# Patient Record
Sex: Male | Born: 1940 | Race: White | Hispanic: No | Smoking: Never smoker
Health system: Southern US, Community
[De-identification: ages and names within clinical notes are randomized; demographics above are authoritative.]

## PROBLEM LIST (undated history)

## (undated) ENCOUNTER — Emergency Department (HOSPITAL_COMMUNITY): Admission: EM | Payer: Self-pay | Source: Home / Self Care

## (undated) DIAGNOSIS — N400 Enlarged prostate without lower urinary tract symptoms: Secondary | ICD-10-CM

## (undated) DIAGNOSIS — I10 Essential (primary) hypertension: Secondary | ICD-10-CM

## (undated) DIAGNOSIS — E785 Hyperlipidemia, unspecified: Secondary | ICD-10-CM

## (undated) DIAGNOSIS — N189 Chronic kidney disease, unspecified: Secondary | ICD-10-CM

## (undated) DIAGNOSIS — R569 Unspecified convulsions: Secondary | ICD-10-CM

## (undated) DIAGNOSIS — N2889 Other specified disorders of kidney and ureter: Secondary | ICD-10-CM

## (undated) DIAGNOSIS — C801 Malignant (primary) neoplasm, unspecified: Secondary | ICD-10-CM

## (undated) HISTORY — PX: TONSILLECTOMY: SUR1361

## (undated) HISTORY — DX: Chronic kidney disease, unspecified: N18.9

## (undated) HISTORY — DX: Malignant (primary) neoplasm, unspecified: C80.1

---

## 1972-03-30 HISTORY — PX: APPENDECTOMY: SHX54

## 2000-03-30 HISTORY — PX: HERNIA REPAIR: SHX51

## 2001-03-03 ENCOUNTER — Ambulatory Visit (HOSPITAL_COMMUNITY): Admission: RE | Admit: 2001-03-03 | Discharge: 2001-03-03 | Payer: Self-pay | Admitting: Gastroenterology

## 2001-04-01 ENCOUNTER — Encounter: Payer: Self-pay | Admitting: General Surgery

## 2001-04-01 ENCOUNTER — Encounter: Admission: RE | Admit: 2001-04-01 | Discharge: 2001-04-01 | Payer: Self-pay | Admitting: General Surgery

## 2001-04-04 ENCOUNTER — Ambulatory Visit (HOSPITAL_BASED_OUTPATIENT_CLINIC_OR_DEPARTMENT_OTHER): Admission: RE | Admit: 2001-04-04 | Discharge: 2001-04-04 | Payer: Self-pay | Admitting: General Surgery

## 2005-10-19 ENCOUNTER — Encounter: Admission: RE | Admit: 2005-10-19 | Discharge: 2005-10-19 | Payer: Self-pay | Admitting: Internal Medicine

## 2008-04-03 ENCOUNTER — Encounter: Admission: RE | Admit: 2008-04-03 | Discharge: 2008-04-03 | Payer: Self-pay | Admitting: Internal Medicine

## 2010-08-15 NOTE — Op Note (Signed)
[place]. [facility]  Patient:    KATHAN, KIRKER Visit Number: [id number] MRN: [id number]          Service Type: DSU Location: [facility] Attending Physician:  Cherylynn Ridges Dictated by:   Jimmye Norman, M.D. Proc. Date: 04/04/01 Admit Date:  04/04/2001                             Operative Report  PREOPERATIVE DIAGNOSIS:  Left inguinal hernia, might be direct.  POSTOPERATIVE DIAGNOSIS:  Large direct left inguinal hernia.  PROCEDURE:  Left inguinal hernia repair with Marlex mesh.  SURGEON:  Jimmye Norman, M.D.  ANESTHESIA:  General with a laryngeal airway.  ESTIMATED BLOOD LOSS:  Less than 20 cc.  COMPLICATIONS:  None.  CONDITION:  Stable.  INDICATION FOR OPERATION:  The patient is a 70 year old gentleman with an increasingly symptomatic left inguinal hernia, who now comes in for repair.  FINDINGS:  The patient had a large direct hernia with no indirect component.  DESCRIPTION OF PROCEDURE:  The patient was taken to the operating room and placed on the operating table in supine position.  After an adequate general anesthetic was administered with a laryngeal airway, she was prepped and draped in the usual sterile manner, exposing the left inguinal area.  A curvilinear transverse incision was made at the level of the superficial inguinal ring on the left side.  It was taken down into the subcutaneous tissue, where large blood vessels had to be cauterized.  We subsequently dissected down to the fascia of the external oblique muscle, which was then cleared of its areolar attachments.  We went down to the external oblique fascia, which was subsequently split along its fibers through the superficial inguinal ring.  It was at that point that the spermatic cord and the large direct sac were mobilized at the pubic tubercle.  A Penrose drain was placed around the spermatic cord.  We subsequently were able to dissect away the direct sac from the spermatic  cord.  We then placed the inguinal ligament laterally along with the ilioinguinal nerve, then imbricated the direct sac on itself using four to five interrupted simple stitches of 0 Ethibond.  Once this was done, we laid an oval piece of Marlex mesh measuring approximately 3 x 5 cm in size, attaching it to the pubic tubercle and bringing it out laterally along the reflected portion of the inguinal ligament and medial and superior medially to the conjoined tendon.  This was done using a running 0 Prolene suture.  Once this was in place, it was sutured up to the internal ring, taking care not to involve the iliohypogastric or the ilioinguinal nerve.  We irrigated the wound with antibiotic solution.  We closed the external oblique fascia using a running 3-0 Vicryl suture.  Then we closed Scarpas fascia using interrupted 3-0 Vicryl.  Marcaine 0.25% was injected into the site, including at the anterior superior iliac spine along the nerve distribution.  Once this was done, the skin was closed using a running subcuticular stitch of 4-0 Prolene.  A sterile dressing was applied.  All needle counts, sponge counts, and instrument counts were correct at the conclusion of the case. Dictated by:   Jimmye Norman, M.D. Attending Physician:  Cherylynn Ridges DD:  04/04/01 TD:  04/04/01 Job: [id number]

## 2010-08-15 NOTE — Procedures (Signed)
[place]. [facility]  Patient:    Taylor Dillon, Taylor Dillon Visit Number: [id number] MRN: [id number]          Service Type: END Location: ENDO Attending Physician:  Charna Elizabeth Dictated by:   Anselmo Rod, M.D. Proc. Date: 03/03/01 Admit Date:  03/03/2001   CC:         Kern Reap, M.D.   Procedure Report  DATE OF BIRTH:  Aug 22, 1940  REFERRING PHYSICIAN:  Kern Reap, M.D.  PROCEDURE PERFORMED:  Colonoscopy.  ENDOSCOPIST:  Anselmo Rod, M.D.  INSTRUMENT USED:  Olympus video colonoscope.  INDICATIONS FOR PROCEDURE:  The patient is a 70 year old white male with a history of guaiac positive stools, rule out colonic polyps, masses, hemorrhoids etc.  PREPROCEDURE PREPARATION:  Informed consent was procured from the patient. The patient was fasted for eight hours prior to the procedure and prepped with a bottle of magnesium citrate and a gallon of NuLytely the night prior to the procedure.  PREPROCEDURE PHYSICAL:  The patient had stable vital signs.  Neck supple. Chest clear to auscultation.  S1, S2 regular.  Abdomen soft with normal bowel sounds.  DESCRIPTION OF PROCEDURE:  The patient was placed in the left lateral decubitus position and sedated with 90 mg of Demerol and 9 mg of Versed intravenously.  Once the patient was adequately sedated and maintained on low-flow oxygen and continuous cardiac monitoring, the Olympus video colonoscope was advanced from the rectum to the cecum without difficulty.  The entire colonic mucosa appeared healthy and without lesions.  There was some residual stool in the colon.  Multiple washes were done.  No masses, erosions, ulcerations or diverticula were seen.  The patient tolerated the procedure well without complication.  The appendiceal orifice and ileocecal valve were clearly visualized and photographed.  IMPRESSION:  Normal-appearing colon up to the cecum.  RECOMMENDATIONS:  Repeat guaiac testing  will be done on an outpatient basis and further recommendations made as need arises.Dictated by:   Anselmo Rod, M.D. Attending Physician:  Charna Elizabeth DD:  03/03/01 TD:  03/03/01 Job: [id number]

## 2010-08-15 NOTE — H&P (Signed)
. [facility]  Patient:    Taylor Dillon, Taylor Dillon Visit Number: [id number] MRN: [id number]          Service Type: Attending:  Jimmye Norman, M.D. Dictated by:   Jimmye Norman, M.D.   CC:         Kern Reap, M.D.   History and Physical  DATE OF BIRTH:  1940/08/30  IDENTIFICATION AND CHIEF COMPLAINT:  The patient is a 70 year old gentleman with a left inguinal hernia who now comes in for repair.  HISTORY OF PRESENT ILLNESS:  I saw the patient on March 08, 2001, at which time, he was complaining of a hernia that had become increasingly more symptomatic over the two years that he had known that it had been present.  He has pain in the area of the inguinal area to where it it annoying with any activity.  Now, it hurts more when he is in the upright position as supposed to when he swims, at which time, he does not appear to have any problems.  It has not been incarcerated.  He had no nausea or vomiting.  No changes in his bowel habits.  He has no difficulty urinating and has no constipation.  PAST MEDICAL HISTORY: 1. Hypertension. 2. Hypercholesterolemia.  MEDICATIONS:  Zocor 20 mg a day, lisinopril and hydrochlorothiazide combination of 20 to 25 mg once a day.  PAST SURGICAL HISTORY:  He had an appendectomy in 1974 and a tonsillectomy in 1950.  He has a history of alcoholism for which he has been free of any alcohol for at least the past seven years.  REVIEW OF SYSTEMS:  No constipation and no diarrhea.  No difficulty urinating.  ALLERGIES:  No known drug allergies.  PHYSICAL EXAMINATION:  GENERAL:  He is well-nourished, a thin gentleman in no acute distress.  HEENT:  He is normocephalic and atraumatic.  Has no scleral icterus.  He has no cervical adenopathy.  LUNGS:  Clear.  CARDIAC:  Exam is regular rhythm and rate with no murmurs.  No lifts or heaves.  No gallops.  ABDOMEN:  Soft and nontender.  He has a large left inguinal hernia  which appears to be direct type.  No palpable hernia on the right side.  RECTAL:  Exam was deferred.  LABORATORY STUDIES:  Will all be done preoperatively.  IMPRESSION:  Left inguinal hernia, possibly or likely direct.  PLAN:  Perform a mesh repair of the hernia as an outpatient.  It will be an open repair as supposed to a laparoscopic repair. Dictated by:   Jimmye Norman, M.D. Attending:  Jimmye Norman, M.D. DD:  04/04/01 TD:  04/04/01 Job: [id number]

## 2010-08-27 ENCOUNTER — Ambulatory Visit: Payer: Self-pay | Admitting: Cardiology

## 2011-05-25 DIAGNOSIS — Z1211 Encounter for screening for malignant neoplasm of colon: Secondary | ICD-10-CM | POA: Diagnosis not present

## 2011-05-25 DIAGNOSIS — D126 Benign neoplasm of colon, unspecified: Secondary | ICD-10-CM | POA: Diagnosis not present

## 2011-09-11 DIAGNOSIS — E782 Mixed hyperlipidemia: Secondary | ICD-10-CM | POA: Diagnosis not present

## 2011-09-11 DIAGNOSIS — E559 Vitamin D deficiency, unspecified: Secondary | ICD-10-CM | POA: Diagnosis not present

## 2011-09-11 DIAGNOSIS — I10 Essential (primary) hypertension: Secondary | ICD-10-CM | POA: Diagnosis not present

## 2011-09-11 DIAGNOSIS — Z125 Encounter for screening for malignant neoplasm of prostate: Secondary | ICD-10-CM | POA: Diagnosis not present

## 2011-09-11 DIAGNOSIS — Z0000 Encounter for general adult medical examination without abnormal findings: Secondary | ICD-10-CM | POA: Diagnosis not present

## 2011-09-14 DIAGNOSIS — E782 Mixed hyperlipidemia: Secondary | ICD-10-CM | POA: Diagnosis not present

## 2011-09-14 DIAGNOSIS — Z0000 Encounter for general adult medical examination without abnormal findings: Secondary | ICD-10-CM | POA: Diagnosis not present

## 2011-09-14 DIAGNOSIS — I10 Essential (primary) hypertension: Secondary | ICD-10-CM | POA: Diagnosis not present

## 2011-12-21 DIAGNOSIS — Z23 Encounter for immunization: Secondary | ICD-10-CM | POA: Diagnosis not present

## 2012-02-03 DIAGNOSIS — H2510 Age-related nuclear cataract, unspecified eye: Secondary | ICD-10-CM | POA: Diagnosis not present

## 2012-02-09 DIAGNOSIS — H269 Unspecified cataract: Secondary | ICD-10-CM | POA: Diagnosis not present

## 2012-02-09 DIAGNOSIS — H2510 Age-related nuclear cataract, unspecified eye: Secondary | ICD-10-CM | POA: Diagnosis not present

## 2012-02-17 DIAGNOSIS — H2510 Age-related nuclear cataract, unspecified eye: Secondary | ICD-10-CM | POA: Diagnosis not present

## 2012-02-17 DIAGNOSIS — H269 Unspecified cataract: Secondary | ICD-10-CM | POA: Diagnosis not present

## 2012-03-11 DIAGNOSIS — Z0000 Encounter for general adult medical examination without abnormal findings: Secondary | ICD-10-CM | POA: Diagnosis not present

## 2012-03-11 DIAGNOSIS — N400 Enlarged prostate without lower urinary tract symptoms: Secondary | ICD-10-CM | POA: Diagnosis not present

## 2012-03-11 DIAGNOSIS — I10 Essential (primary) hypertension: Secondary | ICD-10-CM | POA: Diagnosis not present

## 2012-03-11 DIAGNOSIS — Z79899 Other long term (current) drug therapy: Secondary | ICD-10-CM | POA: Diagnosis not present

## 2012-03-11 DIAGNOSIS — E785 Hyperlipidemia, unspecified: Secondary | ICD-10-CM | POA: Diagnosis not present

## 2012-03-11 DIAGNOSIS — Z125 Encounter for screening for malignant neoplasm of prostate: Secondary | ICD-10-CM | POA: Diagnosis not present

## 2012-03-11 DIAGNOSIS — E782 Mixed hyperlipidemia: Secondary | ICD-10-CM | POA: Diagnosis not present

## 2012-03-11 DIAGNOSIS — E7800 Pure hypercholesterolemia, unspecified: Secondary | ICD-10-CM | POA: Diagnosis not present

## 2012-03-30 HISTORY — PX: OTHER SURGICAL HISTORY: SHX169

## 2012-04-25 DIAGNOSIS — J069 Acute upper respiratory infection, unspecified: Secondary | ICD-10-CM | POA: Diagnosis not present

## 2012-09-02 DIAGNOSIS — Z23 Encounter for immunization: Secondary | ICD-10-CM | POA: Diagnosis not present

## 2012-09-02 DIAGNOSIS — Z125 Encounter for screening for malignant neoplasm of prostate: Secondary | ICD-10-CM | POA: Diagnosis not present

## 2012-09-02 DIAGNOSIS — I10 Essential (primary) hypertension: Secondary | ICD-10-CM | POA: Diagnosis not present

## 2012-09-02 DIAGNOSIS — Z0000 Encounter for general adult medical examination without abnormal findings: Secondary | ICD-10-CM | POA: Diagnosis not present

## 2012-12-21 DIAGNOSIS — Z961 Presence of intraocular lens: Secondary | ICD-10-CM | POA: Diagnosis not present

## 2012-12-21 DIAGNOSIS — H538 Other visual disturbances: Secondary | ICD-10-CM | POA: Diagnosis not present

## 2012-12-21 DIAGNOSIS — H26499 Other secondary cataract, unspecified eye: Secondary | ICD-10-CM | POA: Diagnosis not present

## 2012-12-28 DIAGNOSIS — H2510 Age-related nuclear cataract, unspecified eye: Secondary | ICD-10-CM | POA: Diagnosis not present

## 2013-01-02 DIAGNOSIS — Z23 Encounter for immunization: Secondary | ICD-10-CM | POA: Diagnosis not present

## 2013-02-20 DIAGNOSIS — H903 Sensorineural hearing loss, bilateral: Secondary | ICD-10-CM | POA: Diagnosis not present

## 2013-05-22 DIAGNOSIS — D235 Other benign neoplasm of skin of trunk: Secondary | ICD-10-CM | POA: Diagnosis not present

## 2013-05-22 DIAGNOSIS — L821 Other seborrheic keratosis: Secondary | ICD-10-CM | POA: Diagnosis not present

## 2013-05-22 DIAGNOSIS — B351 Tinea unguium: Secondary | ICD-10-CM | POA: Diagnosis not present

## 2013-09-05 DIAGNOSIS — R7309 Other abnormal glucose: Secondary | ICD-10-CM | POA: Diagnosis not present

## 2013-09-05 DIAGNOSIS — Z125 Encounter for screening for malignant neoplasm of prostate: Secondary | ICD-10-CM | POA: Diagnosis not present

## 2013-09-05 DIAGNOSIS — Z0000 Encounter for general adult medical examination without abnormal findings: Secondary | ICD-10-CM | POA: Diagnosis not present

## 2013-09-05 DIAGNOSIS — Z23 Encounter for immunization: Secondary | ICD-10-CM | POA: Diagnosis not present

## 2013-09-05 DIAGNOSIS — I10 Essential (primary) hypertension: Secondary | ICD-10-CM | POA: Diagnosis not present

## 2013-09-05 DIAGNOSIS — R198 Other specified symptoms and signs involving the digestive system and abdomen: Secondary | ICD-10-CM | POA: Diagnosis not present

## 2013-10-04 DIAGNOSIS — Z961 Presence of intraocular lens: Secondary | ICD-10-CM | POA: Diagnosis not present

## 2013-10-04 DIAGNOSIS — H43819 Vitreous degeneration, unspecified eye: Secondary | ICD-10-CM | POA: Diagnosis not present

## 2013-12-01 DIAGNOSIS — Z23 Encounter for immunization: Secondary | ICD-10-CM | POA: Diagnosis not present

## 2014-03-30 DIAGNOSIS — N2889 Other specified disorders of kidney and ureter: Secondary | ICD-10-CM

## 2014-03-30 HISTORY — DX: Other specified disorders of kidney and ureter: N28.89

## 2014-04-23 ENCOUNTER — Other Ambulatory Visit: Payer: Self-pay | Admitting: Family Medicine

## 2014-04-23 DIAGNOSIS — R1900 Intra-abdominal and pelvic swelling, mass and lump, unspecified site: Secondary | ICD-10-CM

## 2014-04-26 ENCOUNTER — Ambulatory Visit
Admission: RE | Admit: 2014-04-26 | Discharge: 2014-04-26 | Disposition: A | Discharge status: Home / Self Care | Payer: Medicare Other | Source: Ambulatory Visit | Attending: Family Medicine | Admitting: Family Medicine

## 2014-04-26 DIAGNOSIS — K8020 Calculus of gallbladder without cholecystitis without obstruction: Secondary | ICD-10-CM | POA: Diagnosis not present

## 2014-04-26 DIAGNOSIS — K4090 Unilateral inguinal hernia, without obstruction or gangrene, not specified as recurrent: Secondary | ICD-10-CM | POA: Diagnosis not present

## 2014-04-26 DIAGNOSIS — R1900 Intra-abdominal and pelvic swelling, mass and lump, unspecified site: Secondary | ICD-10-CM

## 2014-04-26 DIAGNOSIS — N2889 Other specified disorders of kidney and ureter: Secondary | ICD-10-CM | POA: Diagnosis not present

## 2014-04-26 MED ORDER — IOHEXOL 300 MG/ML  SOLN
100.0000 mL | Freq: Once | INTRAMUSCULAR | Status: AC | PRN
Start: 2014-04-26 — End: 2014-04-26
  Administered 2014-04-26: 100 mL via INTRAVENOUS

## 2014-05-08 DIAGNOSIS — Z1211 Encounter for screening for malignant neoplasm of colon: Secondary | ICD-10-CM | POA: Diagnosis not present

## 2014-05-08 DIAGNOSIS — R634 Abnormal weight loss: Secondary | ICD-10-CM | POA: Diagnosis not present

## 2014-05-08 DIAGNOSIS — K4090 Unilateral inguinal hernia, without obstruction or gangrene, not specified as recurrent: Secondary | ICD-10-CM | POA: Diagnosis not present

## 2014-05-08 DIAGNOSIS — K8080 Other cholelithiasis without obstruction: Secondary | ICD-10-CM | POA: Diagnosis not present

## 2014-05-08 DIAGNOSIS — Z86010 Personal history of colonic polyps: Secondary | ICD-10-CM | POA: Diagnosis not present

## 2014-05-09 DIAGNOSIS — K4090 Unilateral inguinal hernia, without obstruction or gangrene, not specified as recurrent: Secondary | ICD-10-CM | POA: Diagnosis not present

## 2014-05-09 DIAGNOSIS — C649 Malignant neoplasm of unspecified kidney, except renal pelvis: Secondary | ICD-10-CM | POA: Diagnosis not present

## 2014-05-10 DIAGNOSIS — C642 Malignant neoplasm of left kidney, except renal pelvis: Secondary | ICD-10-CM | POA: Diagnosis not present

## 2014-05-10 DIAGNOSIS — J9811 Atelectasis: Secondary | ICD-10-CM | POA: Diagnosis not present

## 2014-05-22 DIAGNOSIS — K4090 Unilateral inguinal hernia, without obstruction or gangrene, not specified as recurrent: Secondary | ICD-10-CM | POA: Diagnosis not present

## 2014-05-24 DIAGNOSIS — K4090 Unilateral inguinal hernia, without obstruction or gangrene, not specified as recurrent: Secondary | ICD-10-CM | POA: Diagnosis not present

## 2014-05-24 DIAGNOSIS — C649 Malignant neoplasm of unspecified kidney, except renal pelvis: Secondary | ICD-10-CM | POA: Diagnosis not present

## 2014-05-25 ENCOUNTER — Other Ambulatory Visit: Payer: Self-pay | Admitting: Urology

## 2014-05-29 ENCOUNTER — Other Ambulatory Visit: Payer: Self-pay | Admitting: Urology

## 2014-06-05 ENCOUNTER — Other Ambulatory Visit (INDEPENDENT_AMBULATORY_CARE_PROVIDER_SITE_OTHER): Payer: Self-pay | Admitting: Surgery

## 2014-06-05 NOTE — Progress Notes (Signed)
Please put orders in Epic surgery 06-15-14 pre op 06-07-14 Thanks

## 2014-06-06 NOTE — Progress Notes (Signed)
Ct abd/pelvis 1/16 epic, cmp 05/25/14 on chart

## 2014-06-06 NOTE — Patient Instructions (Addendum)
Taylor Dillon  06/07/2014   Your procedure is scheduled on: 06/15/14   Report to [facility] Main  Entrance and follow signs to               [place] at 5:15 AM.   Call this number if you have problems the morning of surgery [phone]   Remember:  Do not eat food or drink liquids :After Midnight.     Take these medicines the morning of surgery with A SIP OF WATER: none              FOLLOW BOWEL PREP INSTRUCTIONS                               You may not have any metal on your body including hair pins and              piercings  Do not wear jewelry, make-up, lotions, powders or perfumes.             Do not wear nail polish.  Do not shave  48 hours prior to surgery.              Men may shave face and neck.   Do not bring valuables to the hospital. [place].  Contacts, dentures or bridgework may not be worn into surgery.  Leave suitcase in the car. After surgery it may be brought to your room.     Patients discharged the day of surgery will not be allowed to drive home.  Name and phone number of your driver:  Special Instructions: N/A              Please read over the following fact sheets you were given: _____________________________________________________________________                                                     [place]  Before surgery, you can play an important role.  Because skin is not sterile, your skin needs to be as free of germs as possible.  You can reduce the number of germs on your skin by washing with CHG (chlorahexidine gluconate) soap before surgery.  CHG is an antiseptic cleaner which kills germs and bonds with the skin to continue killing germs even after washing. Please DO NOT use if you have an allergy to CHG or antibacterial soaps.  If your skin becomes reddened/irritated stop using the CHG and inform your nurse when you arrive  at Short Stay. Do not shave (including legs and underarms) for at least 48 hours prior to the first CHG shower.  You may shave your face. Please follow these instructions carefully:   1.  Shower with CHG Soap the night before surgery and the  morning of Surgery.   2.  If you choose to wash your hair, wash your hair first as usual with your  normal  Shampoo.   3.  After you shampoo, rinse your hair and body thoroughly to remove the  shampoo.  4.  Use CHG as you would any other liquid soap.  You can apply chg directly  to the skin and wash . Gently wash with scrungie or clean wascloth    5.  Apply the CHG Soap to your body ONLY FROM THE NECK DOWN.   Do not use on open                           Wound or open sores. Avoid contact with eyes, ears mouth and genitals (private parts).                        Genitals (private parts) with your normal soap.              6.  Wash thoroughly, paying special attention to the area where your surgery  will be performed.   7.  Thoroughly rinse your body with warm water from the neck down.   8.  DO NOT shower/wash with your normal soap after using and rinsing off  the CHG Soap .                9.  Pat yourself dry with a clean towel.             10.  Wear clean pajamas.             11.  Place clean sheets on your bed the night of your first shower and do not  sleep with pets.  Day of Surgery : Do not apply any lotions/deodorants the morning of surgery.  Please wear clean clothes to the hospital/surgery center.  FAILURE TO FOLLOW THESE INSTRUCTIONS MAY RESULT IN THE CANCELLATION OF YOUR SURGERY    PATIENT SIGNATURE_________________________________  ______________________________________________________________________

## 2014-06-07 ENCOUNTER — Encounter (HOSPITAL_COMMUNITY)
Admission: RE | Admit: 2014-06-07 | Discharge: 2014-06-07 | Disposition: A | Discharge status: Home / Self Care | Payer: Medicare Other | Source: Ambulatory Visit | Attending: Urology | Admitting: Urology

## 2014-06-07 ENCOUNTER — Encounter (HOSPITAL_COMMUNITY): Payer: Self-pay

## 2014-06-07 DIAGNOSIS — K4090 Unilateral inguinal hernia, without obstruction or gangrene, not specified as recurrent: Secondary | ICD-10-CM | POA: Insufficient documentation

## 2014-06-07 DIAGNOSIS — N289 Disorder of kidney and ureter, unspecified: Secondary | ICD-10-CM | POA: Diagnosis not present

## 2014-06-07 DIAGNOSIS — Z01810 Encounter for preprocedural cardiovascular examination: Secondary | ICD-10-CM | POA: Insufficient documentation

## 2014-06-07 DIAGNOSIS — Z01812 Encounter for preprocedural laboratory examination: Secondary | ICD-10-CM | POA: Insufficient documentation

## 2014-06-07 HISTORY — DX: Hyperlipidemia, unspecified: E78.5

## 2014-06-07 HISTORY — DX: Unspecified convulsions: R56.9

## 2014-06-07 HISTORY — DX: Benign prostatic hyperplasia without lower urinary tract symptoms: N40.0

## 2014-06-07 HISTORY — DX: Other specified disorders of kidney and ureter: N28.89

## 2014-06-07 HISTORY — DX: Essential (primary) hypertension: I10

## 2014-06-07 LAB — CBC
HEMATOCRIT: 45.2 % (ref 39.0–52.0)
HEMOGLOBIN: 15 g/dL (ref 13.0–17.0)
MCH: 30.6 pg (ref 26.0–34.0)
MCHC: 33.2 g/dL (ref 30.0–36.0)
MCV: 92.2 fL (ref 78.0–100.0)
Platelets: 237 10*3/uL (ref 150–400)
RBC: 4.9 MIL/uL (ref 4.22–5.81)
RDW: 13.5 % (ref 11.5–15.5)
WBC: 6 10*3/uL (ref 4.0–10.5)

## 2014-06-14 NOTE — H&P (Signed)
Elsie Lincoln 05/22/2014 2:16 PM Location: [facility] Patient #: [id number] DOB: [birth year] Single / Language: Cleophus Molt / Race: White Male  History of Present Illness (Brynli Ollis A. Ninfa Linden MD; 05/22/2014 2:59 PM) Patient words: RIH.  The patient is a 74 year old male who presents with an inguinal hernia. This gentleman is referred by Dr. Diona Fanti for evaluation of a right inguinal hernia. During a workup of his hernia, he had undergone a CAT scan showing a large left renal mass. He is going to be scheduled soon for a laparoscopic left nephrectomy. The patient first noticed his hernia in the right inguinal area last year. He has had some mild discomfort recently from the hernia that prompted a CAT scan. He is currently now asymptomatic from the right inguinal hernia. He has had a left inguinal hernia performed by one of the [facility] physicians in the past. The plan would be to have the hernia fixed at the same time as his laparoscopic nephrectomy   Other Problems Mammie Lorenzo, LPN; 9/73/5329 9:24 PM) Alcohol Abuse Enlarged Prostate Hepatitis High blood pressure Hypercholesterolemia Inguinal Hernia Seizure Disorder  Past Surgical History Mammie Lorenzo, LPN; 2/68/3419 6:22 PM) Appendectomy Cataract Surgery Bilateral. Open Inguinal Hernia Surgery Left. Tonsillectomy  Diagnostic Studies History Mammie Lorenzo, LPN; 2/97/9892 1:19 PM) Colonoscopy 1-5 years ago  Allergies Mammie Lorenzo, LPN; 07/15/4079 4:48 PM) No Known Drug Allergies02/23/2016  Medication History Mammie Lorenzo, LPN; 1/85/6314 9:70 PM) Lisinopril-Hydrochlorothiazide (10-12.5MG  Tablet, Oral) Active. Simvastatin (20MG  Tablet, Oral) Active. Norvasc (5MG  Tablet, Oral) Active.  Social History Mammie Lorenzo, LPN; 2/63/7858 8:50 PM) Alcohol use Remotely quit alcohol use. Caffeine use Coffee, Tea. No drug use Tobacco use Never smoker.  Family History Mammie Lorenzo, LPN; 2/77/4128  7:86 PM) Alcohol Abuse Father, Sister. Diabetes Mellitus Brother. Respiratory Condition Mother, Sister.  Vitals Claiborne Billings Dockery LPN; 7/67/2094 7:09 PM) 05/22/2014 2:17 PM Weight: 148.13 lb Height: 70in Body Surface Area: 1.82 m Body Mass Index: 21.25 kg/m Temp.: 97.67F  Pulse: 81 (Regular)  Resp.: 16 (Unlabored)  BP: 136/74 (Sitting, Left Arm, Standard)    Physical Exam (Orell Hurtado A. Ninfa Linden MD; 05/22/2014 3:00 PM) General Mental Status-Alert. General Appearance-Consistent with stated age. Hydration-Well hydrated. Voice-Normal.  Head and Neck Head-normocephalic, atraumatic with no lesions or palpable masses. Trachea-midline.  Eye Eyeball - Bilateral-Extraocular movements intact. Sclera/Conjunctiva - Bilateral-No scleral icterus.  Chest and Lung Exam Chest and lung exam reveals -quiet, even and easy respiratory effort with no use of accessory muscles and on auscultation, normal breath sounds, no adventitious sounds and normal vocal resonance. Inspection Chest Wall - Normal. Back - normal.  Cardiovascular Cardiovascular examination reveals -normal heart sounds, regular rate and rhythm with no murmurs and normal pedal pulses bilaterally.  Abdomen Inspection Skin - Scar - no surgical scars. Hernias - Inguinal hernia - Right - Reducible. Palpation/Percussion Palpation and Percussion of the abdomen reveal - Soft, Non Tender, No Rebound tenderness, No Rigidity (guarding) and No hepatosplenomegaly. Auscultation Auscultation of the abdomen reveals - Bowel sounds normal.  Neurologic Neurologic evaluation reveals -alert and oriented x 3 with no impairment of recent or remote memory. Mental Status-Normal.  Musculoskeletal Normal Exam - Left-Upper Extremity Strength Normal and Lower Extremity Strength Normal. Normal Exam - Right-Upper Extremity Strength Normal, Lower Extremity Weakness.    Assessment & Plan (Ashur Glatfelter A. Ninfa Linden MD;  05/22/2014 3:01 PM) RIGHT INGUINAL HERNIA (550.90  K40.90) Impression: I discussed the diagnosis with him in detail. I would recommend an open repair with mesh at the time of his laparoscopic left  nephrectomy. I discussed this with him in detail. I discussed the risk of surgery which includes but is not limited to bleeding, infection, chronic pain, nerve entrapment, recurrence, etc. He has had surgery before left sides was well aware of these risks. We will coordinate this at the discretion of the urologist in their schedule. If I cannot perform the surgery that we may have our LDOW surgeon perform it only if possible as his nephrectomy will be at [facility].     Signed by Harl Bowie, MD (05/22/2014 3:01 PM)

## 2014-06-15 ENCOUNTER — Encounter (HOSPITAL_COMMUNITY): Payer: Self-pay | Admitting: *Deleted

## 2014-06-15 ENCOUNTER — Encounter (HOSPITAL_COMMUNITY)
Admission: RE | Disposition: A | Discharge status: Home / Self Care | Payer: Self-pay | Source: Ambulatory Visit | Attending: Urology

## 2014-06-15 ENCOUNTER — Inpatient Hospital Stay (HOSPITAL_COMMUNITY): Payer: Medicare Other | Admitting: Anesthesiology

## 2014-06-15 ENCOUNTER — Inpatient Hospital Stay (HOSPITAL_COMMUNITY)
Admission: RE | Admit: 2014-06-15 | Discharge: 2014-06-20 | DRG: 657 | Disposition: A | Discharge status: Home / Self Care | Payer: Medicare Other | Source: Ambulatory Visit | Attending: Urology | Admitting: Urology

## 2014-06-15 DIAGNOSIS — I10 Essential (primary) hypertension: Secondary | ICD-10-CM | POA: Diagnosis not present

## 2014-06-15 DIAGNOSIS — N400 Enlarged prostate without lower urinary tract symptoms: Secondary | ICD-10-CM | POA: Diagnosis present

## 2014-06-15 DIAGNOSIS — F1010 Alcohol abuse, uncomplicated: Secondary | ICD-10-CM | POA: Diagnosis present

## 2014-06-15 DIAGNOSIS — E780 Pure hypercholesterolemia: Secondary | ICD-10-CM | POA: Diagnosis present

## 2014-06-15 DIAGNOSIS — Z79899 Other long term (current) drug therapy: Secondary | ICD-10-CM | POA: Diagnosis not present

## 2014-06-15 DIAGNOSIS — E785 Hyperlipidemia, unspecified: Secondary | ICD-10-CM | POA: Diagnosis present

## 2014-06-15 DIAGNOSIS — C642 Malignant neoplasm of left kidney, except renal pelvis: Secondary | ICD-10-CM | POA: Diagnosis present

## 2014-06-15 DIAGNOSIS — G40909 Epilepsy, unspecified, not intractable, without status epilepticus: Secondary | ICD-10-CM | POA: Diagnosis present

## 2014-06-15 DIAGNOSIS — E871 Hypo-osmolality and hyponatremia: Secondary | ICD-10-CM | POA: Diagnosis not present

## 2014-06-15 DIAGNOSIS — R110 Nausea: Secondary | ICD-10-CM | POA: Diagnosis not present

## 2014-06-15 DIAGNOSIS — N2889 Other specified disorders of kidney and ureter: Secondary | ICD-10-CM | POA: Diagnosis not present

## 2014-06-15 DIAGNOSIS — K4090 Unilateral inguinal hernia, without obstruction or gangrene, not specified as recurrent: Secondary | ICD-10-CM | POA: Diagnosis present

## 2014-06-15 DIAGNOSIS — K759 Inflammatory liver disease, unspecified: Secondary | ICD-10-CM | POA: Diagnosis not present

## 2014-06-15 DIAGNOSIS — R339 Retention of urine, unspecified: Secondary | ICD-10-CM | POA: Diagnosis not present

## 2014-06-15 HISTORY — PX: INGUINAL HERNIA REPAIR: SHX194

## 2014-06-15 HISTORY — PX: INSERTION OF MESH: SHX5868

## 2014-06-15 HISTORY — PX: ROBOT ASSISTED LAPAROSCOPIC NEPHRECTOMY: SHX5140

## 2014-06-15 LAB — HEMOGLOBIN AND HEMATOCRIT, BLOOD
HCT: 41.3 % (ref 39.0–52.0)
HEMOGLOBIN: 13.8 g/dL (ref 13.0–17.0)

## 2014-06-15 LAB — TYPE AND SCREEN
ABO/RH(D): O POS
Antibody Screen: NEGATIVE

## 2014-06-15 LAB — ABO/RH: ABO/RH(D): O POS

## 2014-06-15 SURGERY — ROBOTIC ASSISTED LAPAROSCOPIC NEPHRECTOMY
Anesthesia: General | Laterality: Right

## 2014-06-15 MED ORDER — EPHEDRINE SULFATE 50 MG/ML IJ SOLN
INTRAMUSCULAR | Status: AC
  Filled 2014-06-15: qty 1

## 2014-06-15 MED ORDER — CISATRACURIUM BESYLATE 20 MG/10ML IV SOLN
INTRAVENOUS | Status: AC
  Filled 2014-06-15: qty 10

## 2014-06-15 MED ORDER — LIDOCAINE HCL (CARDIAC) 20 MG/ML IV SOLN
INTRAVENOUS | Status: AC
  Filled 2014-06-15: qty 5

## 2014-06-15 MED ORDER — MIDAZOLAM HCL 2 MG/2ML IJ SOLN
INTRAMUSCULAR | Status: AC
  Filled 2014-06-15: qty 2

## 2014-06-15 MED ORDER — CEFAZOLIN SODIUM-DEXTROSE 2-3 GM-% IV SOLR
INTRAVENOUS | Status: AC
  Filled 2014-06-15: qty 50

## 2014-06-15 MED ORDER — SODIUM CHLORIDE 0.9 % IJ SOLN
INTRAMUSCULAR | Status: AC
  Filled 2014-06-15: qty 10

## 2014-06-15 MED ORDER — ONDANSETRON HCL 4 MG/2ML IJ SOLN
INTRAMUSCULAR | Status: DC | PRN
  Administered 2014-06-15: 4 mg via INTRAVENOUS

## 2014-06-15 MED ORDER — HYDROMORPHONE HCL 2 MG/ML IJ SOLN
INTRAMUSCULAR | Status: AC
  Filled 2014-06-15: qty 1

## 2014-06-15 MED ORDER — HYDROCHLOROTHIAZIDE 12.5 MG PO CAPS
12.5000 mg | ORAL_CAPSULE | Freq: Every day | ORAL | Status: DC
  Administered 2014-06-15 – 2014-06-17 (×3): 12.5 mg via ORAL
  Filled 2014-06-15 (×4): qty 1

## 2014-06-15 MED ORDER — MIDAZOLAM HCL 5 MG/5ML IJ SOLN
INTRAMUSCULAR | Status: DC | PRN
  Administered 2014-06-15 (×2): 1 mg via INTRAVENOUS

## 2014-06-15 MED ORDER — PHENYLEPHRINE 40 MCG/ML (10ML) SYRINGE FOR IV PUSH (FOR BLOOD PRESSURE SUPPORT)
PREFILLED_SYRINGE | INTRAVENOUS | Status: AC
Start: 2014-06-15 — End: 2014-06-15
  Filled 2014-06-15: qty 10

## 2014-06-15 MED ORDER — LACTATED RINGERS IV SOLN
INTRAVENOUS | Status: DC | PRN
  Administered 2014-06-15 (×2): via INTRAVENOUS

## 2014-06-15 MED ORDER — HYDROMORPHONE HCL 1 MG/ML IJ SOLN
INTRAMUSCULAR | Status: DC | PRN
  Administered 2014-06-15: .4 mg via INTRAVENOUS
  Administered 2014-06-15: .2 mg via INTRAVENOUS
  Administered 2014-06-15: .4 mg via INTRAVENOUS
  Administered 2014-06-15: .2 mg via INTRAVENOUS
  Administered 2014-06-15: .4 mg via INTRAVENOUS

## 2014-06-15 MED ORDER — SODIUM CHLORIDE 0.9 % IJ SOLN
INTRAMUSCULAR | Status: AC
  Filled 2014-06-15: qty 20

## 2014-06-15 MED ORDER — ACETAMINOPHEN 500 MG PO TABS
1000.0000 mg | ORAL_TABLET | Freq: Four times a day (QID) | ORAL | Status: AC
  Administered 2014-06-15 – 2014-06-16 (×4): 1000 mg via ORAL
  Filled 2014-06-15 (×6): qty 2

## 2014-06-15 MED ORDER — CISATRACURIUM BESYLATE (PF) 10 MG/5ML IV SOLN
INTRAVENOUS | Status: DC | PRN
  Administered 2014-06-15: 6 mg via INTRAVENOUS
  Administered 2014-06-15: 4 mg via INTRAVENOUS
  Administered 2014-06-15: 12 mg via INTRAVENOUS

## 2014-06-15 MED ORDER — PROPOFOL 10 MG/ML IV BOLUS
INTRAVENOUS | Status: DC | PRN
  Administered 2014-06-15: 120 mg via INTRAVENOUS

## 2014-06-15 MED ORDER — FENTANYL CITRATE 0.05 MG/ML IJ SOLN
25.0000 ug | INTRAMUSCULAR | Status: DC | PRN
  Administered 2014-06-15 (×2): 25 ug via INTRAVENOUS
  Administered 2014-06-15: 50 ug via INTRAVENOUS

## 2014-06-15 MED ORDER — BUPIVACAINE LIPOSOME 1.3 % IJ SUSP
INTRAMUSCULAR | Status: DC | PRN
  Administered 2014-06-15: 20 mL

## 2014-06-15 MED ORDER — FENTANYL CITRATE 0.05 MG/ML IJ SOLN
INTRAMUSCULAR | Status: AC
  Filled 2014-06-15: qty 2

## 2014-06-15 MED ORDER — LISINOPRIL 20 MG PO TABS
20.0000 mg | ORAL_TABLET | Freq: Every day | ORAL | Status: DC
  Administered 2014-06-15 – 2014-06-18 (×4): 20 mg via ORAL
  Filled 2014-06-15 (×5): qty 1

## 2014-06-15 MED ORDER — DEXAMETHASONE SODIUM PHOSPHATE 10 MG/ML IJ SOLN
INTRAMUSCULAR | Status: DC | PRN
  Administered 2014-06-15: 10 mg via INTRAVENOUS

## 2014-06-15 MED ORDER — BUPIVACAINE HCL (PF) 0.25 % IJ SOLN
INTRAMUSCULAR | Status: DC | PRN
  Administered 2014-06-15: 20 mL

## 2014-06-15 MED ORDER — BUPIVACAINE LIPOSOME 1.3 % IJ SUSP
20.0000 mL | Freq: Once | INTRAMUSCULAR | Status: DC
  Filled 2014-06-15: qty 20

## 2014-06-15 MED ORDER — ATROPINE SULFATE 0.4 MG/ML IJ SOLN
INTRAMUSCULAR | Status: DC | PRN
  Administered 2014-06-15: 0.4 mg via INTRAVENOUS

## 2014-06-15 MED ORDER — LISINOPRIL-HYDROCHLOROTHIAZIDE 20-12.5 MG PO TABS: 1.0000 | ORAL_TABLET | Freq: Every morning | ORAL | Status: DC

## 2014-06-15 MED ORDER — SUCCINYLCHOLINE CHLORIDE 20 MG/ML IJ SOLN
INTRAMUSCULAR | Status: DC | PRN
  Administered 2014-06-15: 80 mg via INTRAVENOUS

## 2014-06-15 MED ORDER — ONDANSETRON HCL 4 MG/2ML IJ SOLN
INTRAMUSCULAR | Status: AC
  Filled 2014-06-15: qty 2

## 2014-06-15 MED ORDER — OXYCODONE HCL 5 MG PO TABS
5.0000 mg | ORAL_TABLET | ORAL | Status: DC | PRN
  Administered 2014-06-15 – 2014-06-17 (×8): 5 mg via ORAL
  Filled 2014-06-15 (×8): qty 1

## 2014-06-15 MED ORDER — LACTATED RINGERS IV SOLN: INTRAVENOUS | Status: DC

## 2014-06-15 MED ORDER — DEXAMETHASONE SODIUM PHOSPHATE 10 MG/ML IJ SOLN
INTRAMUSCULAR | Status: AC
  Filled 2014-06-15: qty 1

## 2014-06-15 MED ORDER — HYDROMORPHONE HCL 1 MG/ML IJ SOLN
0.5000 mg | INTRAMUSCULAR | Status: DC | PRN
  Administered 2014-06-15 (×2): 1 mg via INTRAVENOUS
  Filled 2014-06-15 (×2): qty 1

## 2014-06-15 MED ORDER — SUFENTANIL CITRATE 50 MCG/ML IV SOLN
INTRAVENOUS | Status: AC
Start: 2014-06-15 — End: 2014-06-15
  Filled 2014-06-15: qty 1

## 2014-06-15 MED ORDER — HYDROMORPHONE HCL 1 MG/ML IJ SOLN
INTRAMUSCULAR | Status: AC
  Filled 2014-06-15: qty 1

## 2014-06-15 MED ORDER — DEXTROSE-NACL 5-0.45 % IV SOLN
INTRAVENOUS | Status: DC
  Administered 2014-06-15 – 2014-06-16 (×4): via INTRAVENOUS

## 2014-06-15 MED ORDER — MEPERIDINE HCL 50 MG/ML IJ SOLN
6.2500 mg | INTRAMUSCULAR | Status: DC | PRN
  Administered 2014-06-15: 12.5 mg via INTRAVENOUS

## 2014-06-15 MED ORDER — LIDOCAINE HCL (CARDIAC) 20 MG/ML IV SOLN
INTRAVENOUS | Status: DC | PRN
  Administered 2014-06-15: 100 mg via INTRAVENOUS

## 2014-06-15 MED ORDER — ATROPINE SULFATE 0.4 MG/ML IJ SOLN
INTRAMUSCULAR | Status: AC
  Filled 2014-06-15: qty 1

## 2014-06-15 MED ORDER — ACETAMINOPHEN 10 MG/ML IV SOLN
1000.0000 mg | Freq: Once | INTRAVENOUS | Status: AC
Start: 2014-06-15 — End: 2014-06-15
  Administered 2014-06-15: 1000 mg via INTRAVENOUS
  Filled 2014-06-15: qty 100

## 2014-06-15 MED ORDER — MEPERIDINE HCL 50 MG/ML IJ SOLN
INTRAMUSCULAR | Status: AC
  Filled 2014-06-15: qty 1

## 2014-06-15 MED ORDER — PROMETHAZINE HCL 25 MG/ML IJ SOLN
INTRAMUSCULAR | Status: AC
  Filled 2014-06-15: qty 1

## 2014-06-15 MED ORDER — SUFENTANIL CITRATE 50 MCG/ML IV SOLN
INTRAVENOUS | Status: DC | PRN
  Administered 2014-06-15 (×5): 10 ug via INTRAVENOUS

## 2014-06-15 MED ORDER — CEFAZOLIN SODIUM-DEXTROSE 2-3 GM-% IV SOLR
2.0000 g | INTRAVENOUS | Status: AC
  Administered 2014-06-15: 2 g via INTRAVENOUS

## 2014-06-15 MED ORDER — SIMVASTATIN 20 MG PO TABS
20.0000 mg | ORAL_TABLET | Freq: Every day | ORAL | Status: DC
  Administered 2014-06-15 – 2014-06-19 (×5): 20 mg via ORAL
  Filled 2014-06-15 (×5): qty 1

## 2014-06-15 MED ORDER — HYDROCODONE-ACETAMINOPHEN 5-325 MG PO TABS: 1.0000 | ORAL_TABLET | Freq: Four times a day (QID) | ORAL | Status: DC | PRN

## 2014-06-15 MED ORDER — PROMETHAZINE HCL 25 MG/ML IJ SOLN
6.2500 mg | INTRAMUSCULAR | Status: DC | PRN
  Administered 2014-06-15: 6.25 mg via INTRAVENOUS

## 2014-06-15 MED ORDER — PROPOFOL 10 MG/ML IV BOLUS
INTRAVENOUS | Status: AC
  Filled 2014-06-15: qty 20

## 2014-06-15 MED ORDER — GLYCOPYRROLATE 0.2 MG/ML IJ SOLN
INTRAMUSCULAR | Status: AC
  Filled 2014-06-15: qty 3

## 2014-06-15 MED ORDER — LACTATED RINGERS IR SOLN
Status: DC | PRN
  Administered 2014-06-15: 1000 mL

## 2014-06-15 MED ORDER — AMLODIPINE BESYLATE 5 MG PO TABS
5.0000 mg | ORAL_TABLET | Freq: Every day | ORAL | Status: DC
  Administered 2014-06-15 – 2014-06-19 (×5): 5 mg via ORAL
  Filled 2014-06-15 (×5): qty 1

## 2014-06-15 MED ORDER — BUPIVACAINE HCL (PF) 0.25 % IJ SOLN
INTRAMUSCULAR | Status: AC
  Filled 2014-06-15: qty 30

## 2014-06-15 MED ORDER — GLYCOPYRROLATE 0.2 MG/ML IJ SOLN
INTRAMUSCULAR | Status: AC
  Filled 2014-06-15: qty 1

## 2014-06-15 MED ORDER — HYDROMORPHONE HCL 1 MG/ML IJ SOLN
0.2500 mg | INTRAMUSCULAR | Status: DC | PRN
  Administered 2014-06-15: 0.5 mg via INTRAVENOUS

## 2014-06-15 MED ORDER — LACTATED RINGERS IV SOLN
INTRAVENOUS | Status: DC | PRN
  Administered 2014-06-15: 08:00:00 via INTRAVENOUS

## 2014-06-15 MED ORDER — GLYCOPYRROLATE 0.2 MG/ML IJ SOLN
INTRAMUSCULAR | Status: DC | PRN
  Administered 2014-06-15: .6 mg via INTRAVENOUS

## 2014-06-15 MED ORDER — NEOSTIGMINE METHYLSULFATE 10 MG/10ML IV SOLN
INTRAVENOUS | Status: DC | PRN
  Administered 2014-06-15: 4 mg via INTRAVENOUS

## 2014-06-15 SURGICAL SUPPLY — 78 items
ADH SKN CLS APL DERMABOND .7 (GAUZE/BANDAGES/DRESSINGS) ×4
BLADE HEX COATED 2.75 (ELECTRODE) ×3 IMPLANT
BLADE SURG 15 STRL LF DISP TIS (BLADE) ×2 IMPLANT
BLADE SURG 15 STRL SS (BLADE) ×3
CHLORAPREP W/TINT 26ML (MISCELLANEOUS) ×6 IMPLANT
CLIP LIGATING HEM O LOK PURPLE (MISCELLANEOUS) ×2 IMPLANT
CLIP LIGATING HEMO LOK XL GOLD (MISCELLANEOUS) ×3 IMPLANT
CLIP LIGATING HEMO O LOK GREEN (MISCELLANEOUS) IMPLANT
CORDS BIPOLAR (ELECTRODE) ×3 IMPLANT
COVER SURGICAL LIGHT HANDLE (MISCELLANEOUS) ×3 IMPLANT
COVER TIP SHEARS 8 DVNC (MISCELLANEOUS) ×2 IMPLANT
COVER TIP SHEARS 8MM DA VINCI (MISCELLANEOUS) ×1
CUTTER ECHEON FLEX ENDO 45 340 (ENDOMECHANICALS) ×9 IMPLANT
DECANTER SPIKE VIAL GLASS SM (MISCELLANEOUS) ×3 IMPLANT
DERMABOND ADVANCED (GAUZE/BANDAGES/DRESSINGS) ×2
DERMABOND ADVANCED .7 DNX12 (GAUZE/BANDAGES/DRESSINGS) IMPLANT
DRAIN CHANNEL 15F RND FF 3/16 (WOUND CARE) ×2 IMPLANT
DRAIN PENROSE 18X1/2 LTX STRL (DRAIN) ×3 IMPLANT
DRAPE INCISE IOBAN 66X45 STRL (DRAPES) ×3 IMPLANT
DRAPE LAPAROSCOPIC ABDOMINAL (DRAPES) ×3 IMPLANT
DRAPE LAPAROTOMY TRNSV 102X78 (DRAPE) ×3 IMPLANT
DRAPE SHEET LG 3/4 BI-LAMINATE (DRAPES) ×6 IMPLANT
DRAPE TABLE BACK 44X90 PK DISP (DRAPES) ×3 IMPLANT
DRAPE WARM FLUID 44X44 (DRAPE) ×3 IMPLANT
ELECT REM PT RETURN 9FT ADLT (ELECTROSURGICAL) ×6
ELECTRODE REM PT RTRN 9FT ADLT (ELECTROSURGICAL) ×6 IMPLANT
EVACUATOR SILICONE 100CC (DRAIN) ×3 IMPLANT
GAUZE SPONGE 4X4 12PLY STRL (GAUZE/BANDAGES/DRESSINGS) IMPLANT
GLOVE BIOGEL M STRL SZ7.5 (GLOVE) ×6 IMPLANT
GLOVE SURG SIGNA 7.5 PF LTX (GLOVE) ×3 IMPLANT
GOWN STRL REUS W/TWL LRG LVL3 (GOWN DISPOSABLE) ×6 IMPLANT
GOWN STRL REUS W/TWL XL LVL3 (GOWN DISPOSABLE) ×6 IMPLANT
KIT ACCESSORY DA VINCI DISP (KITS) ×1
KIT ACCESSORY DVNC DISP (KITS) ×2 IMPLANT
KIT BASIN OR (CUSTOM PROCEDURE TRAY) ×5 IMPLANT
LIQUID BAND (GAUZE/BANDAGES/DRESSINGS) ×9 IMPLANT
MESH PARIETEX PROGRIP RIGHT (Mesh General) ×1 IMPLANT
NDL HYPO 25X1 1.5 SAFETY (NEEDLE) ×2 IMPLANT
NDL INSUFFLATION 14GA 120MM (NEEDLE) ×2 IMPLANT
NEEDLE HYPO 25X1 1.5 SAFETY (NEEDLE) ×3 IMPLANT
NEEDLE INSUFFLATION 14GA 120MM (NEEDLE) ×3 IMPLANT
PACK BASIC VI WITH GOWN DISP (CUSTOM PROCEDURE TRAY) ×3 IMPLANT
PAD POSITIONING PINK XL (MISCELLANEOUS) IMPLANT
PENCIL BUTTON HOLSTER BLD 10FT (ELECTRODE) ×6 IMPLANT
POSITIONER SURGICAL ARM (MISCELLANEOUS) ×6 IMPLANT
POUCH ENDO CATCH II 15MM (MISCELLANEOUS) ×3 IMPLANT
RELOAD WH ECHELON 45 (STAPLE) ×15 IMPLANT
SET TUBE IRRIG SUCTION NO TIP (IRRIGATION / IRRIGATOR) ×1 IMPLANT
SHEET LAVH (DRAPES) IMPLANT
SOLUTION ANTI FOG 6CC (MISCELLANEOUS) ×3 IMPLANT
SOLUTION ELECTROLUBE (MISCELLANEOUS) ×3 IMPLANT
SPONGE LAP 18X18 X RAY DECT (DISPOSABLE) ×4 IMPLANT
SPONGE LAP 4X18 X RAY DECT (DISPOSABLE) ×6 IMPLANT
STRIP CLOSURE SKIN 1/2X4 (GAUZE/BANDAGES/DRESSINGS) IMPLANT
SUT ETHILON 3 0 PS 1 (SUTURE) ×2 IMPLANT
SUT MNCRL AB 4-0 PS2 18 (SUTURE) ×9 IMPLANT
SUT PDS AB 1 CT1 27 (SUTURE) ×12 IMPLANT
SUT SILK 2 0 SH (SUTURE) ×3 IMPLANT
SUT V-LOC BARB 180 2/0GR6 GS22 (SUTURE)
SUT VIC AB 2-0 CT1 27 (SUTURE)
SUT VIC AB 2-0 CT1 TAPERPNT 27 (SUTURE) IMPLANT
SUT VIC AB 3-0 54XBRD REEL (SUTURE) IMPLANT
SUT VIC AB 3-0 BRD 54 (SUTURE) ×3
SUT VIC AB 3-0 SH 27 (SUTURE) ×3
SUT VIC AB 3-0 SH 27XBRD (SUTURE) IMPLANT
SUT VICRYL 0 UR6 27IN ABS (SUTURE) ×6 IMPLANT
SUTURE V-LC BRB 180 2/0GR6GS22 (SUTURE) IMPLANT
SYR BULB IRRIGATION 50ML (SYRINGE) ×1 IMPLANT
SYR CONTROL 10ML LL (SYRINGE) ×3 IMPLANT
TOWEL OR 17X26 10 PK STRL BLUE (TOWEL DISPOSABLE) ×3 IMPLANT
TOWEL OR NON WOVEN STRL DISP B (DISPOSABLE) ×9 IMPLANT
TRAY FOLEY CATH 14FRSI W/METER (CATHETERS) ×3 IMPLANT
TRAY LAPAROSCOPIC (CUSTOM PROCEDURE TRAY) ×3 IMPLANT
TROCAR 12M 150ML BLUNT (TROCAR) ×4 IMPLANT
TROCAR XCEL 12X100 BLDLESS (ENDOMECHANICALS) ×3 IMPLANT
TUBING INSUFFLATION 10FT LAP (TUBING) ×3 IMPLANT
WATER STERILE IRR 1500ML POUR (IV SOLUTION) ×6 IMPLANT
YANKAUER SUCT BULB TIP 10FT TU (MISCELLANEOUS) IMPLANT

## 2014-06-15 NOTE — Transfer of Care (Signed)
Immediate Anesthesia Transfer of Care Note  Patient: Taylor Dillon  Procedure(s) Performed: Procedure(s): ROBOTIC ASSISTED LAPAROSCOPIC NEPHRECTOMY (Left) HERNIA REPAIR INGUINAL ADULT (Right) INSERTION OF MESH (Right)  Patient Location: PACU  Anesthesia Type:General  Level of Consciousness: awake  Airway & Oxygen Therapy: Patient Spontanous Breathing and Patient connected to face mask oxygen  Post-op Assessment: Report given to RN and Post -op Vital signs reviewed and stable  Post vital signs: Reviewed and stable  Last Vitals:  Filed Vitals:   06/15/14 0517  BP: 151/67  Pulse: 85  Temp: 36.3 C  Resp: 18    Complications: No apparent anesthesia complications

## 2014-06-15 NOTE — Op Note (Signed)
NAME:  Taylor Dillon, Taylor Dillon NO.:  [id number]  MEDICAL RECORD NO.:  [id number]  LOCATION:  [facility]                         FACILITY:  [facility]  PHYSICIAN:  Coralie Keens, M.D. DATE OF BIRTH:  [birth year]  DATE OF PROCEDURE:  06/15/2014 DATE OF DISCHARGE:                              OPERATIVE REPORT   PREOPERATIVE DIAGNOSIS:  Right inguinal hernia.  POSTOPERATIVE DIAGNOSIS:  Right inguinal hernia.  PROCEDURE:  Right inguinal hernia repair with mesh.  SURGEON:  Coralie Keens, MD  ANESTHESIA:  General endotracheal anesthesia.  ESTIMATED BLOOD LOSS:  Minimal.  INDICATIONS:  This is a 74 year old gentleman who was found to have a right inguinal hernia.  He had a CAT scan performed by his primary care physician, which actually showed a large left renal mass as well.  He now presents for combined right inguinal hernia repair as well as robotic assisted left nephrectomy by Dr. Tresa Moore.  I will be performing the inguinal hernia repair.  PROCEDURE IN DETAIL:  The patient was brought to the operating room, identified as [name].  He was placed supine on the operating room table and general anesthesia was induced.  His abdomen was then prepped and draped in usual sterile fashion.  I anesthetized the skin in the right inguinal area with Marcaine and performed an ilioinguinal nerve block with Marcaine.  I then made a longitudinal incision with a scalpel.  I took this down through Scarpa's fascia with electrocautery. The external oblique fascia was then identified and opened toward the internal and external rings.  The testicular cord and structures were controlled with a Penrose drain.  The patient had an indirect hernia sac which I was able to reduce it from the cord structures.  I tied off the base of the sac after reducing the contents with a 2-0 silk suture.  I then excised the redundant sac.  Next, a piece of Prolene ProGrip mesh was brought onto the field.  A  right-sided large piece of mesh was used. I placed it around the cord structures and manipulated it toward the pubis.  I then sewed it to the pubic tubercle, and brought around the cord structures and secured it in place with 2-0 Vicryl sutures.  Good coverage of the inguinal floor and internal ring appeared to be achieved.  I then closed the external oblique fascia over top of this with running 2-0 Vicryl suture.  Scarpa's fascia was then closed with interrupted 3-0 Vicryl sutures.  Skin was closed with a running 4-0 Monocryl.  Skin glue was then applied.  The patient tolerated the procedure well.  All the counts were correct at the end of my portion of the procedure.  At this point, the patient remained intubated and Dr. Tresa Moore presented to the room to proceed with his part of the robotic left nephrectomy.     Coralie Keens, M.D.     DB/MEDQ  D:  06/15/2014  T:  06/15/2014  Job:  [id number]

## 2014-06-15 NOTE — Op Note (Signed)
PROCEDURE NOTE RIGHT INGUINAL HERNIA REPAIR WITH MESH  Taylor Dillon 06/15/2014   Pre-op Diagnosis:RIGHT INGUINAL HERNIA     Post-op Diagnosis: SAME  Procedure(s): RIGHT INGUINAL HERNIA REPAIR WITH MESH  Surgeon(s): Alexis Frock, MD Coralie Keens, MD  Anesthesia: General  Staff:  Circulator: Joesphine Bare, RN; Sarajane Jews, RN Scrub Person: Marilynn Rail Acree, CST  Estimated Blood Loss: Minimal                         [name]   Date: 06/15/2014  Time: 8:20 AM

## 2014-06-15 NOTE — Care Management Note (Addendum)
    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     Action/Plan:   From home.   Anticipated DC Date:  06/20/2014   Anticipated DC Plan:  [place]  CM consult      Choice offered to / List presented to:             Status of service:  In process, will continue to follow Medicare Important Message given?  YES (If response is "NO", the following Medicare IM given date fields will be blank) Date Medicare IM given:  06/18/2014 Medicare IM given by:  [facility] Date Additional Medicare IM given:   Additional Medicare IM given by:    Discharge Disposition:    Per UR Regulation:  Reviewed for med. necessity/level of care/duration of stay  If discussed at [place] of Stay Meetings, dates discussed:    Comments:  06/19/14 Dessa Phi RN BSN NCM [phone] POD#4 L nephrectomy.Hyponatremia-internal medicine cons.No d/c needs.  06/15/14 Dessa Phi RN BSN NCM [phone] s/p lap nephrectomy, r inguinal hernia repair.No anticipated d/c needs.

## 2014-06-15 NOTE — Anesthesia Procedure Notes (Signed)
Procedure Name: Intubation Date/Time: 06/15/2014 7:29 AM Performed by: Danley Danker L Patient Re-evaluated:Patient Re-evaluated prior to inductionOxygen Delivery Method: Circle system utilized Preoxygenation: Pre-oxygenation with 100% oxygen Intubation Type: IV induction Ventilation: Mask ventilation without difficulty and Oral airway inserted - appropriate to patient size Laryngoscope Size: Miller and 3 Grade View: Grade I Tube type: Oral Tube size: 8.0 mm Number of attempts: 1 Airway Equipment and Method: Stylet Placement Confirmation: ETT inserted through vocal cords under direct vision,  positive ETCO2 and breath sounds checked- equal and bilateral Secured at: 21 cm Tube secured with: Tape Dental Injury: Teeth and Oropharynx as per pre-operative assessment

## 2014-06-15 NOTE — Brief Op Note (Signed)
06/15/2014  10:23 AM  PATIENT:  Taylor Dillon  74 y.o. male  PRE-OPERATIVE DIAGNOSIS:  LARGE RENAL MASS right inguinal hernia  POST-OPERATIVE DIAGNOSIS:  LARGE RENAL MASS right inguinal hernia  PROCEDURE:  Procedure(s): ROBOTIC ASSISTED LAPAROSCOPIC NEPHRECTOMY (Left) HERNIA REPAIR INGUINAL ADULT (Right) INSERTION OF MESH (Right)  SURGEON:  Surgeon(s) and Role: Panel 1:    * Alexis Frock, MD - Primary  Panel 2:    * Coralie Keens, MD - Primary  PHYSICIAN ASSISTANT:   ASSISTANTS: Clemetine Marker PA   ANESTHESIA:   general  EBL:  Total I/O In: 1000 [I.V.:1000] Out: 425 [Urine:350; Blood:75]  BLOOD ADMINISTERED:none  DRAINS: 1 - foley to straight drain   LOCAL MEDICATIONS USED:  MARCAINE     SPECIMEN:  Source of Specimen:  1 - Right kidney with adrenal   DISPOSITION OF SPECIMEN:  PATHOLOGY  COUNTS:  YES  TOURNIQUET:  * No tourniquets in log *  DICTATION: .Other Dictation: Dictation Number [id number]  PLAN OF CARE: Admit to inpatient   PATIENT DISPOSITION:  PACU - hemodynamically stable.   Delay start of Pharmacological VTE agent (>24hrs) due to surgical blood loss or risk of bleeding: yes

## 2014-06-15 NOTE — Discharge Instructions (Signed)

## 2014-06-15 NOTE — Anesthesia Postprocedure Evaluation (Signed)
  Anesthesia Post-op Note  Patient: Taylor Dillon  Procedure(s) Performed: Procedure(s) (LRB): ROBOTIC ASSISTED LAPAROSCOPIC NEPHRECTOMY (Left) HERNIA REPAIR INGUINAL ADULT (Right) INSERTION OF MESH (Right)  Patient Location: PACU  Anesthesia Type: General  Level of Consciousness: awake and alert   Airway and Oxygen Therapy: Patient Spontanous Breathing  Post-op Pain: mild  Post-op Assessment: Post-op Vital signs reviewed, Patient's Cardiovascular Status Stable, Respiratory Function Stable, Patent Airway and No signs of Nausea or vomiting  Last Vitals:  Filed Vitals:   06/15/14 1130  BP: 129/68  Pulse: 62  Temp: 36.3 C  Resp: 18    Post-op Vital Signs: stable   Complications: No apparent anesthesia complications

## 2014-06-15 NOTE — Anesthesia Preprocedure Evaluation (Signed)
Anesthesia Evaluation  Patient identified by MRN, date of birth, ID band Patient awake    Reviewed: Allergy & Precautions, NPO status , Patient's Chart, lab work & pertinent test results  Airway Mallampati: II  TM Distance: >3 FB Neck ROM: Full    Dental no notable dental hx.    Pulmonary neg pulmonary ROS,  breath sounds clear to auscultation  Pulmonary exam normal       Cardiovascular hypertension, Pt. on medications Rhythm:Regular Rate:Normal     Neuro/Psych Seizures -,  negative psych ROS   GI/Hepatic negative GI ROS, Neg liver ROS,   Endo/Other  negative endocrine ROS  Renal/GU negative Renal ROS  negative genitourinary   Musculoskeletal negative musculoskeletal ROS (+)   Abdominal   Peds negative pediatric ROS (+)  Hematology negative hematology ROS (+)   Anesthesia Other Findings   Reproductive/Obstetrics negative OB ROS                             Anesthesia Physical Anesthesia Plan  ASA: II  Anesthesia Plan: General   Post-op Pain Management:    Induction: Intravenous  Airway Management Planned: Oral ETT  Additional Equipment:   Intra-op Plan:   Post-operative Plan: Extubation in OR  Informed Consent: I have reviewed the patients History and Physical, chart, labs and discussed the procedure including the risks, benefits and alternatives for the proposed anesthesia with the patient or authorized representative who has indicated his/her understanding and acceptance.   Dental advisory given  Plan Discussed with: CRNA  Anesthesia Plan Comments:         Anesthesia Quick Evaluation

## 2014-06-15 NOTE — Interval H&P Note (Signed)
History and Physical Interval Note: no change in H and P.  06/15/2014 6:53 AM  Taylor Dillon  has presented today for surgery, with the diagnosis of LARGE RENAL MASS  The various methods of treatment have been discussed with the patient and family. After consideration of risks, benefits and other options for treatment, the patient has consented to  Procedure(s): ROBOTIC ASSISTED LAPAROSCOPIC NEPHRECTOMY (Left) HERNIA REPAIR INGUINAL ADULT (Right) INSERTION OF MESH (Right) as a surgical intervention .  The patient's history has been reviewed, patient examined, no change in status, stable for surgery.  I have reviewed the patient's chart and labs.  Questions were answered to the patient's satisfaction.     [name] A

## 2014-06-15 NOTE — H&P (Signed)
Taylor Dillon is an 74 y.o. male.    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 1 artery / 1 vein renovascular anatomy with many parasitic vessels, especially arising from psoas area to lower pole kidney. No contralateral lesions. No chest lesions by chest CT. Recent Cr 0.9. Ipsilateral adrenal w/o masses.  2 - Right Inguinal Hernia - symptomatic right inguinal hernia. Pt desires concomitant repair at time of nephrectomy. He has met with Dr. Ninfa Dillon of gen surg who recs open mesh-based repair by himself or Taylor Dillon [facility] gen surg of week if possible.  PMH sig for appy, TNA, left inguinal hernia repair. No CV disease. No strong blood thinners. He is very vigorous retired Taylor Dillon, having flown on [employer] and enjoys swimming a mile most day . His PCP is Dr. Orland Dillon with [facility].  Today " Taylor Dillon " is seen to proceed with left radical nephrectomy and right inguinal hernia repair. No interval fevers, bone pain.   Past Medical History  Diagnosis Date  . Hypertension   . Hyperlipidemia   . Seizures     20 YRS AGO due to ETOH abuse - no Alcohol x 20 yrs  . Renal mass, left   . BPH (benign prostatic hypertrophy)     Past Surgical History  Procedure Laterality Date  . Tonsillectomy    . Appendectomy  1974  . Hernia repair  2002  . Cataracts removed  2014    History reviewed. No pertinent family history. Social History:  reports that he has never smoked. He does not have any smokeless tobacco history on file. He reports that he does not drink alcohol or use illicit drugs.  Allergies: No Known Allergies  Medications Prior to Admission  Medication Sig Dispense Refill  . amLODipine (NORVASC) 5 MG tablet Take 5 mg by mouth at bedtime.    Marland Kitchen ibuprofen (ADVIL,MOTRIN) 200 MG tablet Take 200 mg by mouth every 6 (six) hours as  needed for moderate pain.    Marland Kitchen lisinopril-hydrochlorothiazide (PRINZIDE,ZESTORETIC) 20-12.5 MG per tablet Take 1 tablet by mouth every morning.    . Multiple Vitamin (MULTIVITAMIN WITH MINERALS) TABS tablet Take 1 tablet by mouth every morning. Centrum men over 9    . Propylhexedrine (BENZEDREX NA) Place 1 puff into the nose 2 (two) times a week.    . simvastatin (ZOCOR) 20 MG tablet Take 20 mg by mouth at bedtime.    . Tetrahydrozoline HCl (VISINE OP) Apply 1-2 drops to eye 3 (three) times a week. After swimming.      No results found for this or any previous visit (from the past 48 hour(s)). No results found.  Review of Systems  Constitutional: Negative.   HENT: Negative.   Eyes: Negative.   Respiratory: Negative.   Cardiovascular: Negative.   Gastrointestinal: Negative.   Genitourinary: Negative.   Musculoskeletal: Negative.   Skin: Negative.   Neurological: Negative.   Endo/Heme/Allergies: Negative.   Psychiatric/Behavioral: Negative.     Blood pressure 151/67, pulse 85, temperature 97.4 F (36.3 C), temperature source Oral, resp. rate 18, height _0  (1.778 m), weight 68.493 kg (151 lb), SpO2 100 %. Physical Exam  Constitutional: He appears well-developed.  HENT:  Head: Normocephalic.  Eyes: Pupils are equal, round, and reactive to light.  Neck: Normal range of motion.  Cardiovascular: Normal rate.  Respiratory: Effort normal.  GI: Soft.  Genitourinary:  No CVAT  Musculoskeletal: Normal range of motion.  Neurological: He is alert.  Skin: Skin is warm.  Psychiatric: He has a normal mood and affect. His behavior is normal. Judgment and thought content normal.     Assessment/Plan   1 - Large Left Renal Neoplasm - appears nonmetastatic but large and aggressive neoplasm, agree nephrectomy first step, hopefully curative. Other options including surveillance / palliative only also discussed but not preferred in this vigorous man with minimal comorbidity.  We  rediscussed the role of radical nephrectomy with the overall goal of complete surgical excision (negative margins) and better staging / diagnosis. We specifically rediscussed that with removal of the kidney there would be an overall renal function decline with attendant risks of renal failure and need for dialysis in some cases, and need for kidney-friendly lifestyle post-op with excellent blood pressure and glycemic control. We then rediscussed surgical approaches including robotic and open techniques with robotic associated with a shorter convalescence. I reshowed the patient on their abdomen the approximately 4-6 incision (trocar) sites as well as presumed extraction sites with robotic approach as well as possible open incision sites. We specifically readdressed that there may be need to alter operative plans according to intraopertive findings including conversion to open procedure. We rediscussed specific peri-operative risks including bleeding, infection, deep vein thrombosis, pulmonary embolism, compartment syndrome, nuropathy / neuropraxia, heart attack, stroke, death, as well as long-term risks such as non-cure / need for additional therapy and need for imaging and lab based post-op surveillance protocols. We rediscussed typical hospital course of approximately 2 day hospitalization, need for peri-operative drains / catheters, and typical post-hospital course with return to most non-strenuous activities by 2 weeks and ability to return to most jobs and more strenuous activity such as exercise by 6 weeks.   2 - Right Inguinal Hernia - will have repair today by Dr. Karolee Dillon preceding nephrectomy.   Taylor Dillon 06/15/2014, 6:03 AM

## 2014-06-15 NOTE — Op Note (Signed)
NAME:  Taylor Dillon, Taylor Dillon NO.:  [id number]  MEDICAL RECORD NO.:  [id number]  LOCATION:  8101                         FACILITY:  [facility]  PHYSICIAN:  Alexis Frock, MD     DATE OF BIRTH:  02-Jun-1940  DATE OF PROCEDURE: 06/15/2014 DATE OF DISCHARGE:                              OPERATIVE REPORT   DIAGNOSIS:  Large left renal mass.  PROCEDURE:  Robotic-assisted laparoscopic left radical nephrectomy with adrenalectomy.  ASSISTANT:  Clemetine Marker, PA.  ESTIMATED BLOOD LOSS:  25 mL.  COMPLICATIONS:  None.  SPECIMEN:  Left kidney with adrenal gland.  FINDINGS: 1. Large left renal mass with multiple parasitic vessels superiorly,     inferiorly and laterally. 2. Single artery, single vein, left renovascular anatomy.  DRAINS:  Foley catheter to straight drain.  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  This mass was clinically localized and he was referred for consideration of definitive management.  Options were discussed including radical nephrectomy with curative intent and he wished to proceed.  The patient also has symptomatic right inguinal hernia and Dr. Ninfa Linden, General Surgery and I agreed that this is reasonable to address these issues at the same time, the patient wished to proceed and informed consent was obtained and placed in the medical record.  PROCEDURE IN DETAIL:  The patient being Taylor Dillon, was verified. Procedure being left radical nephrectomy was confirmed.  Procedure was carried out.  Time-out was performed.  Intravenous antibiotics were administered.  General endotracheal anesthesia had already been introduced and Dr. Ninfa Linden had already completed his open right inguinal hernia repair without complications.  The patient was then placed into a left side up full flank position applying 15  degrees stable flexion superior arm elevator, axillary roll, bottom leg bent and top leg was straight.  Sequential compression device was applied.  He was further fashioned to the operative table using 3-inch tape over foam padding and a beanbag.  Sterile field was created by prepping and draping the patient's entire left flank using chlorhexidine gluconate after Foley catheter was placed per urethra to straight drain.  Next, a high-flow, low-pressure pneumoperitoneum was obtained using Veress technique and the left lower quadrant having passed the aspiration and drop test.  A 12-mm robotic camera port was then placed and positioned approximately 3 fingerbreadths superolateral to the umbilicus. Laparoscopic examination of the peritoneal cavity revealed no significant adhesions and no visceral injury.  There was a very large mass effect from the retroperitoneum displacing the colon quite laterally with the colonic mesentery splayed very medially as well. Additional ports were then placed as follows.  The left subcostal 8-mm robotic port, left far lateral 8-mm robotic port 3 fingerbreadths superomedial to the anterior iliac spine, left inferior paramedian robotic port approximately 4 fingerbreadths above the pubic ramus, 12-mm assistant port in the midline 2 fingerbreadths above the camera and a 12- mm assistant port in the midline just above the umbilicus.  Robot was docked and passed through the electronic checks.  Initial attention was directed to the development of  the left retroperitoneum.  Incision was made lateral to the descending colon from the area of the splenic flexure towards the area of the internal ring and the colon was very carefully swept medially.  As previously mentioned, the colonic mesentery was very much adherent to the anterior surface of this large mass and this was very carefully separated away from the anterior surface, taking great care to avoid disruption of the  mesentery and this did not occur.  Lateral splenic attachments were taken down and let the spleen rotate medially away from the superior and anterior surface of the kidney.  The tail and midportion of the pancreas were also carefully swept away from the anterior surface of the kidney.  The lower pole and mass complex were identified and placed on gentle lateral traction. Dissection was proceeded medial to this.  The ureter and gonadal vessels were encountered and also placed on gentle lateral traction.  Dissection was proceeded in this triangle superiorly towards the area of the renal hilum.  There were numerous parasitic vessels posteriorly entering from the area of the psoas towards the kidney and also medially.  These were taken down using combination of cold clips, laparoscopic staplers and coagulation current energy towards the area of the renal hilum.  Renal hilum was encountered and consisted of a single artery, single vein renovascular anatomy.  The artery was controlled using extra large Hem-O- Lok clip proximal followed by endovascular stapler distally.  Vein was controlled using vascular load stapler proximal to the takeoff of the adrenal vein.  Dissection was then proceeded just lateral to the aorta superiorly, thus sweeping the adrenal gland towards the radical nephrectomy specimen and medial attachments between the aorta and adrenal were controlled using vascular load stapler as were superior attachments to the kidney.  Lateral attachments were taken down with cautery scissors.  The ureter was controlled using cold clipping as was the gonadal vessels.  This completely freed up the left radical nephrectomy.  Specimen was placed in an extra large EndoCatch bag. Hemostasis was appeared excellent.  Sponge and needle counts were correct.  Robot was then undocked.  Specimen was retrieved by connecting the two assistant port sites in the midline and removing the  nephrectomy specimen, setting aside for permanent pathology.  The previous 12-mm camera port site was closed at the level of the fascia using figure-of- eight 2-0 Vicryl.  The omentum was swept over the extraction port site protecting the bowel and the extraction port site was closed at the level of the fascia using figure-of-eight PDS x8 followed by reapproximation of Scarpa's using running Vicryl.  All incision sites were infiltrated with dilute lyophilized Marcaine and closed at the level of the skin using subcuticular Monocryl followed by Dermabond. Procedure was then terminated.  The patient tolerated the procedure well.  There were no immediate periprocedural complications.  The patient was taken to the postanesthesia care unit in stable condition.          ______________________________ Alexis Frock, MD     TM/MEDQ  D:  06/15/2014  T:  06/15/2014  Job:  [id number]

## 2014-06-16 LAB — BASIC METABOLIC PANEL
ANION GAP: 9 (ref 5–15)
BUN: 14 mg/dL (ref 6–23)
CO2: 28 mmol/L (ref 19–32)
CREATININE: 1.35 mg/dL (ref 0.50–1.35)
Calcium: 8.8 mg/dL (ref 8.4–10.5)
Chloride: 92 mmol/L — ABNORMAL LOW (ref 96–112)
GFR calc Af Amer: 58 mL/min — ABNORMAL LOW (ref >90)
GFR calc non Af Amer: 50 mL/min — ABNORMAL LOW (ref >90)
GLUCOSE: 170 mg/dL — AB (ref 70–99)
Potassium: 4.4 mmol/L (ref 3.5–5.1)
Sodium: 129 mmol/L — ABNORMAL LOW (ref 135–145)

## 2014-06-16 LAB — HEMOGLOBIN AND HEMATOCRIT, BLOOD
HCT: 40 % (ref 39.0–52.0)
HEMOGLOBIN: 13.6 g/dL (ref 13.0–17.0)

## 2014-06-16 MED ORDER — DOCUSATE SODIUM 100 MG PO CAPS
100.0000 mg | ORAL_CAPSULE | Freq: Two times a day (BID) | ORAL | Status: DC
  Administered 2014-06-16 – 2014-06-19 (×6): 100 mg via ORAL
  Filled 2014-06-16 (×8): qty 1

## 2014-06-16 MED ORDER — SODIUM CHLORIDE 0.9 % IV BOLUS (SEPSIS)
500.0000 mL | Freq: Once | INTRAVENOUS | Status: AC
  Administered 2014-06-16: 500 mL via INTRAVENOUS

## 2014-06-16 MED ORDER — MENTHOL 3 MG MT LOZG
1.0000 | LOZENGE | OROMUCOSAL | Status: DC | PRN
  Filled 2014-06-16: qty 9

## 2014-06-16 MED ORDER — BISACODYL 10 MG RE SUPP
10.0000 mg | Freq: Once | RECTAL | Status: AC
  Administered 2014-06-16: 10 mg via RECTAL
  Filled 2014-06-16: qty 1

## 2014-06-16 MED ORDER — HYDROMORPHONE HCL 1 MG/ML IJ SOLN
1.0000 mg | Freq: Once | INTRAMUSCULAR | Status: AC
  Administered 2014-06-16: 1 mg via INTRAVENOUS
  Filled 2014-06-16: qty 1

## 2014-06-16 NOTE — Progress Notes (Signed)
Patient ID: Taylor Dillon, male   DOB: January 24, 1941, 74 y.o.   MRN: [id number]  1 Day Post-Op Subjective: Pt without complaints.  Ambulated well yesterday.  Tolerating diet.  Pain controlled.  Objective: Vital signs in last 24 hours: Temp:  [97.4 F (36.3 C)-98.4 F (36.9 C)] 97.9 F (36.6 C) (03/19 0700) Pulse Rate:  [57-79] 61 (03/19 0700) Resp:  [13-20] 20 (03/19 0700) BP: (118-153)/(53-84) 127/63 mmHg (03/19 0700) SpO2:  [94 %-100 %] 96 % (03/19 0700)  Intake/Output from previous day: 03/18 0701 - 03/19 0700 In: 4920.8 [I.V.:4920.8] Out: 3000 [Urine:2925; Blood:75] Intake/Output this shift:    Physical Exam:  General: Alert and oriented CV: RRR Lungs: Clear Abdomen: Soft, ND, Positive BS Incisions: Significant ecchymosis around midline incision but otherwise C/D/I Ext: NT, No erythema  Lab Results:  Recent Labs  06/15/14 1135 06/16/14 0505  HGB 13.8 13.6  HCT 41.3 40.0   BMET  Recent Labs  06/16/14 0505  NA 129*  K 4.4  CL 92*  CO2 28  GLUCOSE 170*  BUN 14  CREATININE 1.35  CALCIUM 8.8     Studies/Results: No results found.  Assessment/Plan: POD # 1 s/p RAL radical nephrectomy and inguinal hernia repair - Ambulate, IS, DVT prophylaxis - PO pain meds - Decrease IVF - D/C catheter - Advance diet   LOS: 1 day   Sherre Wooton,LES 06/16/2014, 8:17 AM

## 2014-06-16 NOTE — Progress Notes (Signed)
Patient has not voided since catheter removed. Patient does not have urge to void. Bladder scan revealed 190 cc or urine. Patient also complains of pain after pain meds given. MD called. New orders placed. Will continue to monitor patient. Setzer, Marchelle Folks

## 2014-06-16 NOTE — Progress Notes (Signed)
Patient ambulated in hallway >300 feet. Patient tolerated very well. Passing gas at this time. Will continue to monitor patient.Setzer, Marchelle Folks

## 2014-06-16 NOTE — Progress Notes (Signed)
Pt still not able to void after 500cc bolus.  Pt does feel a moderate urge to void.  Bladder scan reveals 193cc checked by 2 different staff members. Pt is having some tremors.  His oxygen sat on RA is registering high 80's with tremors placed back on oxygen a 2 l/m Wallace and sats are now 58%.   Dr. Alinda Money on call and notified of above and orders foley replaced.  Foley replaced and received immediate 450cc's of clear yellow urine.  Pt tolerated well.

## 2014-06-17 LAB — BASIC METABOLIC PANEL
Anion gap: 8 (ref 5–15)
Anion gap: 9 (ref 5–15)
BUN: 12 mg/dL (ref 6–23)
BUN: 11 mg/dL (ref 6–23)
CALCIUM: 8.4 mg/dL (ref 8.4–10.5)
CHLORIDE: 87 mmol/L — AB (ref 96–112)
CO2: 24 mmol/L (ref 19–32)
CO2: 26 mmol/L (ref 19–32)
Calcium: 8.4 mg/dL (ref 8.4–10.5)
Chloride: 87 mmol/L — ABNORMAL LOW (ref 96–112)
Creatinine, Ser: 1.18 mg/dL (ref 0.50–1.35)
Creatinine, Ser: 1.16 mg/dL (ref 0.50–1.35)
GFR calc non Af Amer: 59 mL/min — ABNORMAL LOW (ref >90)
GFR, EST AFRICAN AMERICAN: 68 mL/min — AB (ref >90)
GFR, EST AFRICAN AMERICAN: 69 mL/min — AB (ref >90)
GFR, EST NON AFRICAN AMERICAN: 60 mL/min — AB (ref >90)
GLUCOSE: 142 mg/dL — AB (ref 70–99)
Glucose, Bld: 160 mg/dL — ABNORMAL HIGH (ref 70–99)
Potassium: 3.5 mmol/L (ref 3.5–5.1)
Potassium: 3.8 mmol/L (ref 3.5–5.1)
SODIUM: 119 mmol/L — AB (ref 135–145)
SODIUM: 122 mmol/L — AB (ref 135–145)

## 2014-06-17 MED ORDER — ACETAMINOPHEN 325 MG PO TABS
650.0000 mg | ORAL_TABLET | Freq: Four times a day (QID) | ORAL | Status: DC | PRN
  Administered 2014-06-18: 650 mg via ORAL
  Filled 2014-06-17: qty 2

## 2014-06-17 MED ORDER — TRAMADOL HCL 50 MG PO TABS
100.0000 mg | ORAL_TABLET | Freq: Four times a day (QID) | ORAL | Status: DC | PRN
  Administered 2014-06-17 – 2014-06-20 (×5): 100 mg via ORAL
  Filled 2014-06-17 (×6): qty 2

## 2014-06-17 MED ORDER — TAMSULOSIN HCL 0.4 MG PO CAPS
0.4000 mg | ORAL_CAPSULE | Freq: Every day | ORAL | Status: DC
  Administered 2014-06-17 – 2014-06-20 (×4): 0.4 mg via ORAL
  Filled 2014-06-17 (×4): qty 1

## 2014-06-17 MED ORDER — ONDANSETRON HCL 4 MG/2ML IJ SOLN
4.0000 mg | Freq: Four times a day (QID) | INTRAMUSCULAR | Status: DC
  Administered 2014-06-17: 4 mg via INTRAVENOUS
  Filled 2014-06-17: qty 2

## 2014-06-17 MED ORDER — ONDANSETRON HCL 4 MG/2ML IJ SOLN: 4.0000 mg | Freq: Four times a day (QID) | INTRAMUSCULAR | Status: DC | PRN

## 2014-06-17 MED ORDER — KCL IN DEXTROSE-NACL 20-5-0.9 MEQ/L-%-% IV SOLN
INTRAVENOUS | Status: DC
  Administered 2014-06-17: 10:00:00 via INTRAVENOUS
  Filled 2014-06-17 (×3): qty 1000

## 2014-06-17 NOTE — Progress Notes (Signed)
Foley taken out at 1230. Patient urinated at 1530 150cc of urine. Bladder scan revealed 440 cc in bladder. Dr. Alinda Money at bedside ordered in and out cath. Cath performed and 300 cc of urine in bladder. Check PVR after patient voids tonight per MD. Will continue to monitor patient. Setzer, Marchelle Folks

## 2014-06-17 NOTE — Progress Notes (Addendum)
Patient ID: Taylor Dillon, male   DOB: 07/21/1940, 74 y.o.   MRN: [id number]  2 Days Post-Op Subjective: Pt was unable to void yesterday after catheter removal and catheter replaced last night with return of 450 cc of clear urine. Urine output improved overnight. Pt denies any difficulty voiding at baseline. He has history of BPH but has not required medical therapy.  He has been passing flatus but also with worsening nausea overnight.  No vomiting.  Ambulating well.   Objective: Vital signs in last 24 hours: Temp:  [97.3 F (36.3 C)-97.8 F (36.6 C)] 97.5 F (36.4 C) (03/20 0653) Pulse Rate:  [64-87] 84 (03/20 0653) Resp:  [18] 18 (03/20 0653) BP: (133-151)/(75-89) 146/89 mmHg (03/20 0653) SpO2:  [87 %-95 %] 95 % (03/20 0653)  Intake/Output from previous day: 03/19 0701 - 03/20 0700 In: 3922.1 [P.O.:1920; I.V.:1502.1; IV Piggyback:500] Out: 1450 [Urine:1450] Intake/Output this shift:    Physical Exam:  General: Alert and oriented CV: RRR Lungs: Clear Abdomen: Soft, ND, Positive bowel sounds although somewhat distant, NT, Ecchymosis around incision site is stable. Ext: NT, No erythema  Lab Results:  Recent Labs  06/15/14 1135 06/16/14 0505  HGB 13.8 13.6  HCT 41.3 40.0   BMET  Recent Labs  06/16/14 0505 06/17/14 0747  NA 129* 119*  K 4.4 3.5  CL 92* 87*  CO2 28 24  GLUCOSE 170* 160*  BUN 14 12  CREATININE 1.35 1.18  CALCIUM 8.8 8.4     Studies/Results: No results found.  Assessment/Plan: POD # 2 s/p RAL left radical nephrectomy 1) Urinary retention: Alpha blocker therapy.  Will give trial of voiding again later today. 2) Hyponatremia: Unclear etiology.  Will change IVF to NS and will recheck BMP this afternoon. Renal function remains stable. No neurologic symptoms noted. Adrenal gland was removed with kidney. No contralateral renal/adrenal surgery by history.  No BP drop to suggest adrenal insufficiency at this time.  If hyponatremia not improved today,  will consider ACTH stimulation test. 3) Nausea: May be related to hyponatremia vs. GI etiology.  Will increase IVF hydration rate.  Antiemetics. Will try changing pain medication.   LOS: 2 days   Lamarius Dirr,LES 06/17/2014, 9:00 AM

## 2014-06-18 ENCOUNTER — Encounter (HOSPITAL_COMMUNITY): Payer: Self-pay | Admitting: Urology

## 2014-06-18 DIAGNOSIS — N2889 Other specified disorders of kidney and ureter: Secondary | ICD-10-CM

## 2014-06-18 DIAGNOSIS — E871 Hypo-osmolality and hyponatremia: Secondary | ICD-10-CM

## 2014-06-18 LAB — BASIC METABOLIC PANEL
ANION GAP: 3 — AB (ref 5–15)
Anion gap: 9 (ref 5–15)
Anion gap: 3 — ABNORMAL LOW (ref 5–15)
BUN: 14 mg/dL (ref 6–23)
BUN: 16 mg/dL (ref 6–23)
BUN: 17 mg/dL (ref 6–23)
CALCIUM: 7.6 mg/dL — AB (ref 8.4–10.5)
CHLORIDE: 86 mmol/L — AB (ref 96–112)
CO2: 24 mmol/L (ref 19–32)
CO2: 28 mmol/L (ref 19–32)
CO2: 27 mmol/L (ref 19–32)
CREATININE: 1.21 mg/dL (ref 0.50–1.35)
Calcium: 8.2 mg/dL — ABNORMAL LOW (ref 8.4–10.5)
Calcium: 8 mg/dL — ABNORMAL LOW (ref 8.4–10.5)
Chloride: 84 mmol/L — ABNORMAL LOW (ref 96–112)
Chloride: 88 mmol/L — ABNORMAL LOW (ref 96–112)
Creatinine, Ser: 1.22 mg/dL (ref 0.50–1.35)
Creatinine, Ser: 1.33 mg/dL (ref 0.50–1.35)
GFR calc Af Amer: 59 mL/min — ABNORMAL LOW (ref >90)
GFR calc non Af Amer: 57 mL/min — ABNORMAL LOW (ref >90)
GFR, EST AFRICAN AMERICAN: 65 mL/min — AB (ref >90)
GFR, EST AFRICAN AMERICAN: 66 mL/min — AB (ref >90)
GFR, EST NON AFRICAN AMERICAN: 56 mL/min — AB (ref >90)
GFR, EST NON AFRICAN AMERICAN: 51 mL/min — AB (ref >90)
Glucose, Bld: 127 mg/dL — ABNORMAL HIGH (ref 70–99)
Glucose, Bld: 130 mg/dL — ABNORMAL HIGH (ref 70–99)
Glucose, Bld: 160 mg/dL — ABNORMAL HIGH (ref 70–99)
POTASSIUM: 4.1 mmol/L (ref 3.5–5.1)
POTASSIUM: 4.2 mmol/L (ref 3.5–5.1)
Potassium: 3.5 mmol/L (ref 3.5–5.1)
SODIUM: 117 mmol/L — AB (ref 135–145)
Sodium: 117 mmol/L — CL (ref 135–145)
Sodium: 118 mmol/L — CL (ref 135–145)

## 2014-06-18 MED ORDER — SODIUM CHLORIDE 1 G PO TABS
1.0000 g | ORAL_TABLET | Freq: Two times a day (BID) | ORAL | Status: DC
  Administered 2014-06-18 – 2014-06-19 (×2): 1 g via ORAL
  Filled 2014-06-18 (×4): qty 1

## 2014-06-18 NOTE — Progress Notes (Signed)
Received critical lab value of Sodium 118. Paged on call to notify. Will cont to monitor. Petra Kuba RN

## 2014-06-18 NOTE — Consult Note (Signed)
[facility] Medical Consultation  OSIRIS ODRISCOLL DUK:[id number] DOB: 11-08-1940 DOA: 06/15/2014 PCP: Gavin Pound, MD   Requesting physician: Dr. Tresa Moore Date of consultation: 06/18/14 Reason for consultation: Hyponatremia  Impression/Recommendations Active Problems:   Renal mass -  Pt is pod 2 s/p RAL left radical nephrectomy - Nephrology managin     Hyponatremia - suspect most likely 2ary to medication effect - Will obtain urine/serum osmolality. Also obtain urine sodium and serum sodium - BMP q 6 hours - Salt tablet replacement   Chief Complaint: Some generalized weakness since the operation he reports  HPI:  Patient is a 74 year old that presented initially with left renal mass and is currently status post left radical nephrectomy with pathology pending. We were consulted because of patient's low sodium levels. Patient of note has been on hydrochlorothiazide lisinopril and amlodipine. He denies any symptoms other than generalized weakness since he has had since his operation. We were consulted for further medical evaluation recommendations regarding his hyponatremia.  Review of Systems:  14 point review of system reviewed and negative unless otherwise mentioned above.  Past Medical History  Diagnosis Date  . Hypertension   . Hyperlipidemia   . Seizures     20 YRS AGO due to ETOH abuse - no Alcohol x 20 yrs  . Renal mass, left   . BPH (benign prostatic hypertrophy)    Past Surgical History  Procedure Laterality Date  . Tonsillectomy    . Appendectomy  1974  . Hernia repair  2002  . Cataracts removed  2014   Social History:  reports that he has never smoked. He does not have any smokeless tobacco history on file. He reports that he does not drink alcohol or use illicit drugs.  No Known Allergies Family hx - history of alcoholism: dads side  Prior to Admission medications   Medication Sig Start Date End Date Taking? Authorizing Provider  amLODipine (NORVASC)  5 MG tablet Take 5 mg by mouth at bedtime.   Yes Historical Provider, MD  ibuprofen (ADVIL,MOTRIN) 200 MG tablet Take 200 mg by mouth every 6 (six) hours as needed for moderate pain.   Yes Historical Provider, MD  lisinopril-hydrochlorothiazide (PRINZIDE,ZESTORETIC) 20-12.5 MG per tablet Take 1 tablet by mouth every morning.   Yes Historical Provider, MD  Multiple Vitamin (MULTIVITAMIN WITH MINERALS) TABS tablet Take 1 tablet by mouth every morning. Centrum men over 86   Yes Historical Provider, MD  Propylhexedrine (BENZEDREX NA) Place 1 puff into the nose 2 (two) times a week.   Yes Historical Provider, MD  simvastatin (ZOCOR) 20 MG tablet Take 20 mg by mouth at bedtime.   Yes Historical Provider, MD  Tetrahydrozoline HCl (VISINE OP) Apply 1-2 drops to eye 3 (three) times a week. After swimming.   Yes Historical Provider, MD  HYDROcodone-acetaminophen (NORCO) 5-325 MG per tablet Take 1-2 tablets by mouth every 6 (six) hours as needed. 06/15/14   Debbrah Alar, PA-C   Physical Exam: Blood pressure 120/63, pulse 75, temperature 97.7 F (36.5 C), temperature source Axillary, resp. rate 20, height 5\' 10"  (1.778 m), weight 68.493 kg (151 lb), SpO2 92 %. Filed Vitals:   06/18/14 1103  BP: 120/63  Pulse: 75  Temp:   Resp:      General:  Pt in nad, alert and awake  Eyes: eomi  ENT: normal exterior appearance, MMM  Neck: supple no goiter  Cardiovascular: rrr, nomrg  Respiratory: cta bl, no wheezes  Abdomen: soft, incision intact with some bruising. ND,  no guarding  Skin: soft, NT, bruising at abdomen  Musculoskeletal: no cyanosis or clubbing  Psychiatric: mood and affect appropriate  Neurologic: non focal   Labs on Admission:  Basic Metabolic Panel:  Recent Labs Lab 06/16/14 0505 06/17/14 0747 06/17/14 1416 06/18/14 0539  NA 129* 119* 122* 117*  K 4.4 3.5 3.8 3.5  CL 92* 87* 87* 84*  CO2 28 24 26 24   GLUCOSE 170* 160* 142* 127*  BUN 14 12 11 14   CREATININE 1.35 1.18  1.16 1.22  CALCIUM 8.8 8.4 8.4 7.6*   Liver Function Tests: No results for input(s): AST, ALT, ALKPHOS, BILITOT, PROT, ALBUMIN in the last 168 hours. No results for input(s): LIPASE, AMYLASE in the last 168 hours. No results for input(s): AMMONIA in the last 168 hours. CBC:  Recent Labs Lab 06/15/14 1135 06/16/14 0505  HGB 13.8 13.6  HCT 41.3 40.0   Cardiac Enzymes: No results for input(s): CKTOTAL, CKMB, CKMBINDEX, TROPONINI in the last 168 hours. BNP: Invalid input(s): POCBNP CBG: No results for input(s): GLUCAP in the last 168 hours.  Radiological Exams on Admission: No results found.  EKG: Independently reviewed. Normal sinus with no ST elevation or depressions on EKG on 06/07/14  Time spent: > 35 minutes  Velvet Bathe [facility] Pager [phone]   If 7PM-7AM, please contact night-coverage [email or web address] Password [facility] 06/18/2014, 11:50 AM

## 2014-06-18 NOTE — Progress Notes (Signed)
Critical lab value: Sodium 117 called to Dr. Zettie Pho nurse at office. Lind Guest, RN

## 2014-06-18 NOTE — Progress Notes (Addendum)
2000 thru 0400:  Attempts to void yield between 30 and 100cc's of urine.  Bladder scans post void show between 90 and 129cc.s.  Pt reports only small urge to urinate.  Dr. Risa Grill notified around 22:00 and he orders to continue to let pt attempt to empty bladder without placing a cath until nursing judgement recommends I/O cath.  Pt loses IV site around midnight but is tolerating fluids well and goals set to push po fluids agreed upon as pt does not want to have his IV restarted and is receiving no meds per IV he chooses not to have IV restarted.  Is unable to void at 0400 and reports his bladder feels moderately full.  I and O cath drains 550cc's of amber, clear urine.  Pt tolerates well and agrees to continue to push po fluids with a goal of 480'cc in next hour.  Will continue to monitor.

## 2014-06-18 NOTE — Progress Notes (Signed)
3 Days Post-Op  Subjective: 1 - Large Left Renal Neoplasm - s/p left robotic radical nephrectomy for large left renal mass on 06/15/14, the day of admission without acute complications, path pending.    2 - Right Inguinal Hernia - s/p repair of right inguinal hernia 06/15/14 by Blackmen on day of admission. \  3 - Hyponatremia - New hyponatremia noted post nephrectomy this admission. Persistant despite holding IVF. No headaches. He is on HCTZ and ACEI at baseline.   4 - Urinary Retention - minimal baseline voiding compliants. Failed initial trial of void post-o pand req I+O cath x1, now on tamsulosin and voiding with residuals 300cc or less.   Today Taylor Dillon is w/o complaints. Voiding, ambulating, had BM yesterday. Na remains low.   Objective: Vital signs in last 24 hours: Temp:  [97.6 F (36.4 C)-98.4 F (36.9 C)] 97.7 F (36.5 C) (03/21 0446) Pulse Rate:  [72-85] 80 (03/21 0446) Resp:  [20] 20 (03/21 0446) BP: (116-152)/(49-84) 116/49 mmHg (03/21 0446) SpO2:  [90 %-98 %] 92 % (03/21 0446) Last BM Date: 06/17/14  Intake/Output from previous day: 03/20 0701 - 03/21 0700 In: 3246.7 [P.O.:1920; I.V.:1326.7] Out: 1890 [Urine:1889; Stool:1] Intake/Output this shift:    General appearance: alert, cooperative and appears stated age Eyes: negative Nose: Nares normal. Septum midline. Mucosa normal. No drainage or sinus tenderness. Throat: lips, mucosa, and tongue normal; teeth and gums normal Resp: non-labored on room air Cardio: Nl rate GI: soft, non-tender; bowel sounds normal; no masses,  no organomegaly Extremities: extremities normal, atraumatic, no cyanosis or edema Pulses: 2+ and symmetric Skin: Skin color, texture, turgor normal. No rashes or lesions Neurologic: Grossly normal Incision/Wound: recent incision sites c/d/i. Some skin bruising that is stable.   Lab Results:   Recent Labs  06/15/14 1135 06/16/14 0505  HGB 13.8 13.6  HCT 41.3 40.0   BMET  Recent Labs  06/17/14 1416 06/18/14 0539  NA 122* 117*  K 3.8 3.5  CL 87* 84*  CO2 26 24  GLUCOSE 142* 127*  BUN 11 14  CREATININE 1.16 1.22  CALCIUM 8.4 7.6*   PT/INR No results for input(s): LABPROT, INR in the last 72 hours. ABG No results for input(s): PHART, HCO3 in the last 72 hours.  Invalid input(s): PCO2, PO2  Studies/Results: No results found.  Anti-infectives: Anti-infectives    Start     Dose/Rate Route Frequency Ordered Stop   06/15/14 0519  ceFAZolin (ANCEF) IVPB 2 g/50 mL premix     2 g 100 mL/hr over 30 Minutes Intravenous 30 min pre-op 06/15/14 0519 06/15/14 0732      Assessment/Plan:  1 - Large Left Renal Neoplasm - path pending, GFR remains acceptable.   2 - Right Inguinal Hernia - resolved clinically s/p repair this admission.   3 - Hyponatremia - unclear etiology. DDX diluaiton from hypotonic IVF in face of continued HCTZ v. Other. AS not improved with holding IVF and conservative measures, will obtain medicine consult today. He is asymptomatic.  4 - Urinary Retention - resolved, continued tamsulosin now and likely at DC.   [facility], Erikka Follmer 06/18/2014

## 2014-06-19 LAB — BASIC METABOLIC PANEL
Anion gap: 6 (ref 5–15)
Anion gap: 8 (ref 5–15)
BUN: 16 mg/dL (ref 6–23)
BUN: 16 mg/dL (ref 6–23)
CALCIUM: 8 mg/dL — AB (ref 8.4–10.5)
CALCIUM: 8.3 mg/dL — AB (ref 8.4–10.5)
CO2: 24 mmol/L (ref 19–32)
CO2: 25 mmol/L (ref 19–32)
CREATININE: 1.22 mg/dL (ref 0.50–1.35)
Chloride: 89 mmol/L — ABNORMAL LOW (ref 96–112)
Chloride: 89 mmol/L — ABNORMAL LOW (ref 96–112)
Creatinine, Ser: 1.14 mg/dL (ref 0.50–1.35)
GFR calc Af Amer: 71 mL/min — ABNORMAL LOW (ref >90)
GFR calc Af Amer: 65 mL/min — ABNORMAL LOW (ref >90)
GFR, EST NON AFRICAN AMERICAN: 61 mL/min — AB (ref >90)
GFR, EST NON AFRICAN AMERICAN: 56 mL/min — AB (ref >90)
GLUCOSE: 110 mg/dL — AB (ref 70–99)
GLUCOSE: 109 mg/dL — AB (ref 70–99)
POTASSIUM: 3.8 mmol/L (ref 3.5–5.1)
Potassium: 4 mmol/L (ref 3.5–5.1)
SODIUM: 122 mmol/L — AB (ref 135–145)
Sodium: 119 mmol/L — CL (ref 135–145)

## 2014-06-19 LAB — OSMOLALITY: OSMOLALITY: 249 mosm/kg — AB (ref 275–300)

## 2014-06-19 LAB — SODIUM, URINE, RANDOM: Sodium, Ur: 33 mmol/L

## 2014-06-19 LAB — OSMOLALITY, URINE: Osmolality, Ur: 560 mOsm/kg (ref 390–1090)

## 2014-06-19 MED ORDER — SODIUM CHLORIDE 1 G PO TABS
2.0000 g | ORAL_TABLET | Freq: Three times a day (TID) | ORAL | Status: DC
  Administered 2014-06-19 – 2014-06-20 (×3): 2 g via ORAL
  Filled 2014-06-19 (×5): qty 2

## 2014-06-19 NOTE — Progress Notes (Signed)
[facility]                                                                              Patient Demographics  Taylor Dillon, is a 74 y.o. male, DOB - 05/19/1940, VUY:[id number]  Admit date - 06/15/2014   Admitting Physician Alexis Frock, MD  Outpatient Primary MD for the patient is NNODI, ADAKU, MD  LOS - 4   No chief complaint on file.      Brief HPI   Patient is a 74 year old that presented initially with left renal mass and is currently status post left radical nephrectomy with pathology pending. We were consulted because of patient's low sodium levels. Patient of note has been on hydrochlorothiazide lisinopril and amlodipine. He denies any symptoms other than generalized weakness since he has had since his operation. We were consulted for further medical evaluation recommendations regarding his hyponatremia.   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   Code Status: FC  Family Communication: Discussed in detail with the patient, all imaging results, lab results explained to the patient    Disposition Plan:   Time Spent in minutes  25 minutes   DVT Prophylaxis  SCD's  Medications  Scheduled Meds: . amLODipine  5 mg Oral QHS  . docusate sodium  100 mg Oral BID  . simvastatin  20 mg Oral QHS  . sodium chloride  2 g Oral TID WC  . tamsulosin  0.4 mg Oral Daily   Continuous Infusions:  PRN Meds:.acetaminophen, menthol-cetylpyridinium, ondansetron (ZOFRAN) IV, traMADol   Antibiotics   Anti-infectives    Start     Dose/Rate Route Frequency Ordered Stop   06/15/14 0519  ceFAZolin (ANCEF) IVPB 2 g/50 mL premix     2 g 100 mL/hr over 30 Minutes Intravenous 30 min  pre-op 06/15/14 0519 06/15/14 0732        Subjective:   Taylor Dillon was seen and examined today.  Patient denies dizziness, chest pain, shortness of breath, abdominal pain, N/V/D/C, new weakness, numbess, tingling. No acute events overnight.    Objective:   Blood pressure 145/67, pulse 81, temperature 98.1 F (36.7 C), temperature source Oral, resp. rate 18, height 5\' 10"  (1.778 m), weight 68.493 kg (151 lb), SpO2 94 %.  Wt Readings from Last 3 Encounters:  06/15/14 68.493 kg (151 lb)     Intake/Output Summary (Last 24 hours) at 06/19/14 1341 Last data filed at 06/19/14 0900  Gross per 24 hour  Intake    420 ml  Output    800 ml  Net   -380 ml    Exam  General: Alert and oriented x 3, NAD  HEENT:  PERRLA, EOMI, Anicteic Sclera, mucous membranes moist.   Neck: Supple, no JVD, no masses  CVS: S1 S2 auscultated, no rubs, murmurs or gallops. Regular rate and rhythm.  Respiratory: Clear to auscultation bilaterally, no wheezing,  rales or rhonchi  Abdomen: Soft, nontender, nondistended, + bowel sounds, surgical scars and ecchymosis  Ext: no cyanosis clubbing or edema  Neuro: AAOx3, Cr N's II- XII. Strength 5/5 upper and lower extremities bilaterally  Skin: No rashes  Psych: Normal affect and demeanor, alert and oriented x3    Data Review   Micro Results No results found for this or any previous visit (from the past 240 hour(s)).  Radiology Reports No results found.  CBC  Recent Labs Lab 06/15/14 1135 06/16/14 0505  HGB 13.8 13.6  HCT 41.3 40.0    Chemistries   Recent Labs Lab 06/18/14 0539 06/18/14 1400 06/18/14 2005 06/19/14 0151 06/19/14 0829  NA 117* 117* 118* 119* 122*  K 3.5 4.1 4.2 3.8 4.0  CL 84* 86* 88* 89* 89*  CO2 24 28 27 24 25   GLUCOSE 127* 130* 160* 110* 109*  BUN 14 16 17 16 16   CREATININE 1.22 1.33 1.21 1.14 1.22  CALCIUM 7.6* 8.2* 8.0* 8.0* 8.3*    ------------------------------------------------------------------------------------------------------------------ estimated creatinine clearance is 50.7 mL/min (by C-G formula based on Cr of 1.22). ------------------------------------------------------------------------------------------------------------------ No results for input(s): HGBA1C in the last 72 hours. ------------------------------------------------------------------------------------------------------------------ No results for input(s): CHOL, HDL, LDLCALC, TRIG, CHOLHDL, LDLDIRECT in the last 72 hours. ------------------------------------------------------------------------------------------------------------------ No results for input(s): TSH, T4TOTAL, T3FREE, THYROIDAB in the last 72 hours.  Invalid input(s): FREET3 ------------------------------------------------------------------------------------------------------------------ No results for input(s): VITAMINB12, FOLATE, FERRITIN, TIBC, IRON, RETICCTPCT in the last 72 hours.  Coagulation profile No results for input(s): INR, PROTIME in the last 168 hours.  No results for input(s): DDIMER in the last 72 hours.  Cardiac Enzymes No results for input(s): CKMB, TROPONINI, MYOGLOBIN in the last 168 hours.  Invalid input(s): CK ------------------------------------------------------------------------------------------------------------------ Invalid input(s): POCBNP  No results for input(s): GLUCAP in the last 72 hours.   Valentine Kuechle M.D. [facility] 06/19/2014, 1:41 PM  Pager: [phone]   Between 7am to 7pm - call Pager - [phone]  After 7pm go to [email or web address] - password [email or web address]  Call night coverage person covering after 7pm

## 2014-06-19 NOTE — Progress Notes (Signed)
4 Days Post-Op  Subjective:  1 - Large Left Renal Neoplasm - s/p left robotic radical nephrectomy for large left renal mass on 06/15/14, the day of admission without acute complications, path pending.    2 - Right Inguinal Hernia - s/p repair of right inguinal hernia 06/15/14 by Blackmen on day of admission. \  3 - Hyponatremia - New hyponatremia noted post nephrectomy this admission. Persistant despite holding IVF. No headaches. He is on HCTZ and ACEI at baseline. Internal medicine consult 3/21.   4 - Urinary Retention - minimal baseline voiding compliants. Failed initial trial of void post-o pand req I+O cath x1, now on tamsulosin and voiding with residuals 300cc or less.   Today Dallis is w/o complaints. Voiding, ambulating, had BM yesterday. Na remains low but improving by serial labs.   Objective: Vital signs in last 24 hours: Temp:  [97.9 F (36.6 C)-98.5 F (36.9 C)] 98.1 F (36.7 C) (03/22 0540) Pulse Rate:  [75-85] 81 (03/22 0540) Resp:  [18] 18 (03/22 0540) BP: (120-145)/(60-74) 145/67 mmHg (03/22 0540) SpO2:  [94 %] 94 % (03/22 0540) Last BM Date: 06/17/14  Intake/Output from previous day: 03/21 0701 - 03/22 0700 In: 480 [P.O.:480] Out: 800 [Urine:800] Intake/Output this shift:    General appearance: alert, cooperative and appears stated age Head: Normocephalic, without obvious abnormality, atraumatic Nose: Nares normal. Septum midline. Mucosa normal. No drainage or sinus tenderness. Throat: lips, mucosa, and tongue normal; teeth and gums normal Neck: supple, symmetrical, trachea midline Back: symmetric, no curvature. ROM normal. No CVA tenderness. Resp: non-labored on room air Cardio: Nl rate GI: soft, non-tender; bowel sounds normal; no masses,  no organomegaly Male genitalia: normal Extremities: extremities normal, atraumatic, no cyanosis or edema Pulses: 2+ and symmetric Skin: Skin color, texture, turgor normal. No rashes or lesions Lymph nodes: Cervical,  supraclavicular, and axillary nodes normal. Neurologic: Mental status: Alert, oriented, thought content appropriate, denies headache Incision/Wound: recent port and extraction sites with stable ecchymoses, no hematomas.   Lab Results:  No results for input(s): WBC, HGB, HCT, PLT in the last 72 hours. BMET  Recent Labs  06/18/14 2005 06/19/14 0151  NA 118* 119*  K 4.2 3.8  CL 88* 89*  CO2 27 24  GLUCOSE 160* 110*  BUN 17 16  CREATININE 1.21 1.14  CALCIUM 8.0* 8.0*   PT/INR No results for input(s): LABPROT, INR in the last 72 hours. ABG No results for input(s): PHART, HCO3 in the last 72 hours.  Invalid input(s): PCO2, PO2  Studies/Results: No results found.  Anti-infectives: Anti-infectives    Start     Dose/Rate Route Frequency Ordered Stop   06/15/14 0519  ceFAZolin (ANCEF) IVPB 2 g/50 mL premix     2 g 100 mL/hr over 30 Minutes Intravenous 30 min pre-op 06/15/14 0519 06/15/14 0732      Assessment/Plan:  1 - Large Left Renal Neoplasm - path pending, GFR remains acceptable.   2 - Right Inguinal Hernia - resolved clinically s/p repair this admission.   3 - Hyponatremia - unclear etiology. DDX diluaiton from hypotonic IVF in face of continued HCTZ v. requilibration after removal of endocrine active tumor. Adrenal crises felt unlikely given glycemic and BP stability. GREATLY appreciate internal medicine help. \  4 - Urinary Retention - resolved, continued tamsulosin now and likely at DC.   [facility], Lavone Barrientes 06/19/2014

## 2014-06-19 NOTE — Progress Notes (Signed)
Received critical lab value of Sodium 119. Paged on call to notify. Will cont to monitor. Petra Kuba RN

## 2014-06-19 NOTE — Clinical Documentation Improvement (Signed)
Possible Clinical Conditions?   Renal Cell Carcinoma                                 Other Condition              Cannot Clinically Determine   Supporting Information:  FINAL DIAGNOSIS:1. 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   Thank You, Alessandra Grout, RN, BSN, CCDS,Clinical Documentation Specialist:  [phone]  [phone]=[facility] [place]- Health Information Management

## 2014-06-20 DIAGNOSIS — I10 Essential (primary) hypertension: Secondary | ICD-10-CM

## 2014-06-20 LAB — BASIC METABOLIC PANEL
ANION GAP: 7 (ref 5–15)
BUN: 16 mg/dL (ref 6–23)
CALCIUM: 7.8 mg/dL — AB (ref 8.4–10.5)
CO2: 25 mmol/L (ref 19–32)
Chloride: 95 mmol/L — ABNORMAL LOW (ref 96–112)
Creatinine, Ser: 1.22 mg/dL (ref 0.50–1.35)
GFR, EST AFRICAN AMERICAN: 65 mL/min — AB (ref >90)
GFR, EST NON AFRICAN AMERICAN: 56 mL/min — AB (ref >90)
Glucose, Bld: 101 mg/dL — ABNORMAL HIGH (ref 70–99)
POTASSIUM: 4.1 mmol/L (ref 3.5–5.1)
SODIUM: 127 mmol/L — AB (ref 135–145)

## 2014-06-20 MED ORDER — SODIUM CHLORIDE 1 G PO TABS: 1.0000 g | ORAL_TABLET | Freq: Three times a day (TID) | ORAL | Status: DC

## 2014-06-20 MED ORDER — TRAMADOL HCL 50 MG PO TABS: 100.0000 mg | ORAL_TABLET | Freq: Three times a day (TID) | ORAL | Status: DC | PRN

## 2014-06-20 MED ORDER — TAMSULOSIN HCL 0.4 MG PO CAPS: 0.4000 mg | ORAL_CAPSULE | Freq: Every day | ORAL | Status: DC

## 2014-06-20 MED ORDER — SODIUM CHLORIDE 1 G PO TABS
1.0000 g | ORAL_TABLET | Freq: Three times a day (TID) | ORAL | Status: DC
  Administered 2014-06-20: 1 g via ORAL
  Filled 2014-06-20 (×2): qty 1

## 2014-06-20 NOTE — Progress Notes (Signed)
[facility]                                                                              Patient Demographics  Taylor Dillon, is a 74 y.o. male, DOB - [birth year], ZOX:[id number]  Admit date - 06/15/2014   Admitting Physician Taylor Frock, MD  Outpatient Primary MD for the patient is NNODI, ADAKU, MD  LOS - 5   No chief complaint on file.      Brief HPI   Patient is a 74 year old that presented initially with left renal mass and is currently status post left radical nephrectomy with pathology pending. We were consulted because of patient's low sodium levels. Patient of note has been on hydrochlorothiazide lisinopril and amlodipine. He denies any symptoms other than generalized weakness since he has had since his operation. We were consulted for further medical evaluation recommendations regarding his hyponatremia.   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 I explained to the patient in detail to get the labs checked on 3/25, and to stop the sodium tabs if Na >138. He has a follow-up appointment with his PCP on 3/28, defer to PCP regarding further management of hyponatremia and may need to repeat his labs on 3/28. - Lisinopril, HCTZ has been discontinued  Essential hypertension - Continue Norvasc, patient was given full instructions on increasing Norvasc dose if blood pressure is running high.   Code Status: FC  Family Communication: Discussed in detail with the patient, all imaging results, lab results explained to the patient    Disposition Plan: Patient okay for discharge from medical standpoint, discussed with Dr. Tresa Dillon, I will  sign off.  Time Spent in minutes  25 minutes   DVT Prophylaxis  SCD's  Medications  Scheduled Meds: . amLODipine  5 mg Oral QHS  . docusate sodium  100 mg Oral BID  . simvastatin  20 mg Oral QHS  . sodium chloride  1 g Oral TID WC  . tamsulosin  0.4 mg Oral Daily   Continuous Infusions:  PRN Meds:.acetaminophen, menthol-cetylpyridinium, ondansetron (ZOFRAN) IV, traMADol   Antibiotics   Anti-infectives    Start     Dose/Rate Route Frequency Ordered Stop   06/15/14 0519  ceFAZolin (ANCEF) IVPB 2 g/50 mL premix     2 g 100 mL/hr over 30 Minutes Intravenous 30 min pre-op 06/15/14 0519 06/15/14 0732        Subjective:   Taylor Dillon was seen and examined today.  Patient denies dizziness, chest pain, shortness of breath, abdominal pain, N/V/D/C, new weakness, numbess, tingling. No acute events overnight. Doing well, looking forward to DC home today   Objective:   Blood pressure 132/63, pulse 73, temperature 98.8 F (37.1 C), temperature source Oral, resp. rate 20, height 5\' 10"  (1.778 m), weight 68.493 kg (151 lb), SpO2 96 %.  Wt Readings  from Last 3 Encounters:  06/15/14 68.493 kg (151 lb)     Intake/Output Summary (Last 24 hours) at 06/20/14 1044 Last data filed at 06/20/14 0634  Gross per 24 hour  Intake    820 ml  Output   1450 ml  Net   -630 ml    Exam  General: Alert and oriented x 3, NAD  CVS: S1 S2 clear. Regular rate and rhythm.  Respiratory: CTAB  Abdomen: Soft, nontender, nondistended, + bowel sounds, surgical scars and ecchymosis  Ext: no cyanosis clubbing or edema  Neuro: AAOx3, Cr N's II- XII. Strength 5/5 upper and lower extremities bilaterally  Skin: No rashes  Psych: Normal affect and demeanor, alert and oriented x3    Data Review   Micro Results No results found for this or any previous visit (from the past 240 hour(s)).  Radiology Reports No results found.  CBC  Recent Labs Lab 06/15/14 1135 06/16/14 0505  HGB 13.8  13.6  HCT 41.3 40.0    Chemistries   Recent Labs Lab 06/18/14 1400 06/18/14 2005 06/19/14 0151 06/19/14 0829 06/20/14 0455  NA 117* 118* 119* 122* 127*  K 4.1 4.2 3.8 4.0 4.1  CL 86* 88* 89* 89* 95*  CO2 28 27 24 25 25   GLUCOSE 130* 160* 110* 109* 101*  BUN 16 17 16 16 16   CREATININE 1.33 1.21 1.14 1.22 1.22  CALCIUM 8.2* 8.0* 8.0* 8.3* 7.8*   ------------------------------------------------------------------------------------------------------------------ estimated creatinine clearance is 50.7 mL/min (by C-G formula based on Cr of 1.22). ------------------------------------------------------------------------------------------------------------------ No results for input(s): HGBA1C in the last 72 hours. ------------------------------------------------------------------------------------------------------------------ No results for input(s): CHOL, HDL, LDLCALC, TRIG, CHOLHDL, LDLDIRECT in the last 72 hours. ------------------------------------------------------------------------------------------------------------------ No results for input(s): TSH, T4TOTAL, T3FREE, THYROIDAB in the last 72 hours.  Invalid input(s): FREET3 ------------------------------------------------------------------------------------------------------------------ No results for input(s): VITAMINB12, FOLATE, FERRITIN, TIBC, IRON, RETICCTPCT in the last 72 hours.  Coagulation profile No results for input(s): INR, PROTIME in the last 168 hours.  No results for input(s): DDIMER in the last 72 hours.  Cardiac Enzymes No results for input(s): CKMB, TROPONINI, MYOGLOBIN in the last 168 hours.  Invalid input(s): CK ------------------------------------------------------------------------------------------------------------------ Invalid input(s): POCBNP  No results for input(s): GLUCAP in the last 72 hours.   Taylor Dillon M.D. [facility] 06/20/2014, 10:44 AM  Pager: [phone]   Between 7am to  7pm - call Pager - [phone]  After 7pm go to [email or web address] - password [email or web address]  Call night coverage person covering after 7pm

## 2014-06-20 NOTE — Discharge Summary (Signed)
Physician Discharge Summary  Patient ID: Taylor Dillon MRN: [id number] DOB/AGE: September 01, 1940 74 y.o.  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   2 - Right Inguinal Hernia - s/p repair of right inguinal hernia 06/15/14 by [facility] on day of admission.   3 - Hyponatremia - New hyponatremia noted post nephrectomy this admission. Persistant despite holding IVF. No headaches. He is on HCTZ and ACEI at baseline. Internal medicine consult 3/21 and held ACEI and HCTZ and began salt tabs to be continued at discharge. Na at discharge 122 and asymptomatic.   4 - Urinary Retention - minimal baseline voiding compliants. Failed initial trial of void post-o pand req I+O cath x1, now on tamsulosin and voiding with residuals 300cc or less.   By 3/23, the day of discharge, pt ambulatory, tollerating regular diet with resumed bowel fucntion, pain controlled on PO meds, hyponatremia resolved, and felt to be adeuqate for discharge.    Consults: internal medicine  Significant Diagnostic Studies: labs: pathology and serum labs as per above.  Treatments:  left robotic radical nephrectomy for large left renal mass on 06/15/14,  Discharge Exam: Blood pressure 132/63, pulse 73, temperature 98.8 F (37.1 C), temperature source Oral, resp. rate 20, height 5\' 10"  (1.778 m), weight 68.493 kg (151 lb), SpO2 96 %. General appearance: alert, cooperative and appears stated age Head: Normocephalic, without obvious abnormality, atraumatic Nose: Nares normal. Septum midline. Mucosa normal. No drainage or sinus  tenderness. Throat: lips, mucosa, and tongue normal; teeth and gums normal Neck: supple, symmetrical, trachea midline Back: symmetric, no curvature. ROM normal. No CVA tenderness. Resp: non-labored on room air Cardio: regular rate and rhythm, S1, S2 normal, no murmur, click, rub or gallop GI: soft, non-tender; bowel sounds normal; no masses,  no organomegaly Male genitalia: normal Extremities: extremities normal, atraumatic, no cyanosis or edema Pulses: 2+ and symmetric Skin: Skin color, texture, turgor normal. No rashes or lesions Lymph nodes: Cervical, supraclavicular, and axillary nodes normal. Neurologic: Grossly normal Incision/Wound:  Disposition: Final discharge disposition not confirmed  Discharge Instructions    Diet general    Complete by:  As directed      Discharge instructions    Complete by:  As directed   Please get the labs checked BMET for sodium level on 3/25 (Friday). If sodium is above 138, you can stop taking the salt tabs. If sodium level is still lower, then continue salt tabs and repeat labs, metabolic panel on Tuesday, 3/29 next week.      Please note that Lisinopril and HCTZ has been discontinued. Check your BP daily, if it starts running high, SBP above 140 or DBP above 90, you can increase norvasc to 10mg  (2 tabs) daily.     Increase activity slowly    Complete by:  As directed             Medication List    STOP taking these medications        ibuprofen 200 MG tablet  Commonly known as:  ADVIL,MOTRIN     lisinopril-hydrochlorothiazide 20-12.5 MG per tablet  Commonly known as:  PRINZIDE,ZESTORETIC  multivitamin with minerals Tabs tablet      TAKE these medications        amLODipine 5 MG tablet  Commonly known as:  NORVASC  Take 5 mg by mouth at bedtime.     BENZEDREX NA  Place 1 puff into the nose 2 (two) times a week.     HYDROcodone-acetaminophen 5-325 MG per tablet  Commonly known as:  NORCO  Take 1-2 tablets by mouth every 6  (six) hours as needed.     simvastatin 20 MG tablet  Commonly known as:  ZOCOR  Take 20 mg by mouth at bedtime.     sodium chloride 1 G tablet  Take 1 tablet (1 g total) by mouth 3 (three) times daily with meals. Please see discharge instructions     tamsulosin 0.4 MG Caps capsule  Commonly known as:  FLOMAX  Take 1 capsule (0.4 mg total) by mouth daily.     traMADol 50 MG tablet  Commonly known as:  ULTRAM  Take 2 tablets (100 mg total) by mouth every 8 (eight) hours as needed for moderate pain.     VISINE OP  Apply 1-2 drops to eye 3 (three) times a week. After swimming.           Follow-up Information    Follow up with Alexis Frock, MD On 07/02/2014.   Specialty:  Urology   Why:  at 8:45   Contact information:   [place] [address] [phone]       Follow up with Gavin Pound, MD On 06/25/2014.   Specialty:  Family Medicine   Why:  Appointment at 1130, however please arrive at 11:15. Phone number [phone]. for labs for metabolic panel, sodium level. Follow instructions. May need to repeat labs next week 3/28. Please call the office to get BMET checked on 3/25.    Contact information:   [place] [address] [phone]       Signed: Alexis Frock 06/20/2014, 1:40 PM

## 2014-06-25 DIAGNOSIS — E871 Hypo-osmolality and hyponatremia: Secondary | ICD-10-CM | POA: Diagnosis not present

## 2014-06-25 DIAGNOSIS — C649 Malignant neoplasm of unspecified kidney, except renal pelvis: Secondary | ICD-10-CM | POA: Diagnosis not present

## 2014-06-25 DIAGNOSIS — I10 Essential (primary) hypertension: Secondary | ICD-10-CM | POA: Diagnosis not present

## 2014-07-02 DIAGNOSIS — C649 Malignant neoplasm of unspecified kidney, except renal pelvis: Secondary | ICD-10-CM | POA: Diagnosis not present

## 2014-07-02 DIAGNOSIS — Z905 Acquired absence of kidney: Secondary | ICD-10-CM | POA: Diagnosis not present

## 2014-07-05 DIAGNOSIS — R634 Abnormal weight loss: Secondary | ICD-10-CM | POA: Diagnosis not present

## 2014-07-05 DIAGNOSIS — Z1211 Encounter for screening for malignant neoplasm of colon: Secondary | ICD-10-CM | POA: Diagnosis not present

## 2014-07-05 DIAGNOSIS — Z86010 Personal history of colonic polyps: Secondary | ICD-10-CM | POA: Diagnosis not present

## 2014-09-11 DIAGNOSIS — R739 Hyperglycemia, unspecified: Secondary | ICD-10-CM | POA: Diagnosis not present

## 2014-09-11 DIAGNOSIS — C649 Malignant neoplasm of unspecified kidney, except renal pelvis: Secondary | ICD-10-CM | POA: Diagnosis not present

## 2014-09-11 DIAGNOSIS — Z1211 Encounter for screening for malignant neoplasm of colon: Secondary | ICD-10-CM | POA: Diagnosis not present

## 2014-09-11 DIAGNOSIS — Z0000 Encounter for general adult medical examination without abnormal findings: Secondary | ICD-10-CM | POA: Diagnosis not present

## 2014-09-11 DIAGNOSIS — R339 Retention of urine, unspecified: Secondary | ICD-10-CM | POA: Diagnosis not present

## 2014-09-11 DIAGNOSIS — E785 Hyperlipidemia, unspecified: Secondary | ICD-10-CM | POA: Diagnosis not present

## 2014-09-11 DIAGNOSIS — E871 Hypo-osmolality and hyponatremia: Secondary | ICD-10-CM | POA: Diagnosis not present

## 2014-09-11 DIAGNOSIS — I10 Essential (primary) hypertension: Secondary | ICD-10-CM | POA: Diagnosis not present

## 2014-09-11 DIAGNOSIS — N400 Enlarged prostate without lower urinary tract symptoms: Secondary | ICD-10-CM | POA: Diagnosis not present

## 2014-10-08 DIAGNOSIS — K635 Polyp of colon: Secondary | ICD-10-CM | POA: Diagnosis not present

## 2014-10-08 DIAGNOSIS — Z86010 Personal history of colonic polyps: Secondary | ICD-10-CM | POA: Diagnosis not present

## 2014-10-08 DIAGNOSIS — Z1211 Encounter for screening for malignant neoplasm of colon: Secondary | ICD-10-CM | POA: Diagnosis not present

## 2014-11-01 ENCOUNTER — Emergency Department (HOSPITAL_COMMUNITY)
Admission: EM | Admit: 2014-11-01 | Discharge: 2014-11-01 | Disposition: A | Discharge status: Home / Self Care | Payer: Medicare Other | Attending: Emergency Medicine | Admitting: Emergency Medicine

## 2014-11-01 ENCOUNTER — Emergency Department (HOSPITAL_COMMUNITY): Payer: Medicare Other

## 2014-11-01 ENCOUNTER — Encounter (HOSPITAL_COMMUNITY): Payer: Self-pay

## 2014-11-01 DIAGNOSIS — Z87438 Personal history of other diseases of male genital organs: Secondary | ICD-10-CM | POA: Diagnosis not present

## 2014-11-01 DIAGNOSIS — Z79899 Other long term (current) drug therapy: Secondary | ICD-10-CM | POA: Diagnosis not present

## 2014-11-01 DIAGNOSIS — I10 Essential (primary) hypertension: Secondary | ICD-10-CM | POA: Insufficient documentation

## 2014-11-01 DIAGNOSIS — K8020 Calculus of gallbladder without cholecystitis without obstruction: Secondary | ICD-10-CM | POA: Diagnosis not present

## 2014-11-01 DIAGNOSIS — R1013 Epigastric pain: Secondary | ICD-10-CM | POA: Insufficient documentation

## 2014-11-01 DIAGNOSIS — R197 Diarrhea, unspecified: Secondary | ICD-10-CM | POA: Diagnosis not present

## 2014-11-01 DIAGNOSIS — E785 Hyperlipidemia, unspecified: Secondary | ICD-10-CM | POA: Insufficient documentation

## 2014-11-01 DIAGNOSIS — Z9049 Acquired absence of other specified parts of digestive tract: Secondary | ICD-10-CM | POA: Diagnosis not present

## 2014-11-01 DIAGNOSIS — N4289 Other specified disorders of prostate: Secondary | ICD-10-CM | POA: Diagnosis not present

## 2014-11-01 DIAGNOSIS — Z905 Acquired absence of kidney: Secondary | ICD-10-CM | POA: Diagnosis not present

## 2014-11-01 LAB — URINALYSIS, ROUTINE W REFLEX MICROSCOPIC
Bilirubin Urine: NEGATIVE
Glucose, UA: NEGATIVE mg/dL
Hgb urine dipstick: NEGATIVE
Ketones, ur: NEGATIVE mg/dL
NITRITE: NEGATIVE
Protein, ur: 100 mg/dL — AB
SPECIFIC GRAVITY, URINE: 1.013 (ref 1.005–1.030)
UROBILINOGEN UA: 0.2 mg/dL (ref 0.0–1.0)
pH: 6 (ref 5.0–8.0)

## 2014-11-01 LAB — COMPREHENSIVE METABOLIC PANEL
ALBUMIN: 4.3 g/dL (ref 3.5–5.0)
ALT: 31 U/L (ref 17–63)
AST: 31 U/L (ref 15–41)
Alkaline Phosphatase: 67 U/L (ref 38–126)
Anion gap: 7 (ref 5–15)
BILIRUBIN TOTAL: 0.7 mg/dL (ref 0.3–1.2)
BUN: 20 mg/dL (ref 6–20)
CO2: 24 mmol/L (ref 22–32)
CREATININE: 1.58 mg/dL — AB (ref 0.61–1.24)
Calcium: 9 mg/dL (ref 8.9–10.3)
Chloride: 106 mmol/L (ref 101–111)
GFR, EST AFRICAN AMERICAN: 47 mL/min — AB (ref >60)
GFR, EST NON AFRICAN AMERICAN: 41 mL/min — AB (ref >60)
Glucose, Bld: 110 mg/dL — ABNORMAL HIGH (ref 65–99)
POTASSIUM: 4 mmol/L (ref 3.5–5.1)
SODIUM: 137 mmol/L (ref 135–145)
TOTAL PROTEIN: 7.5 g/dL (ref 6.5–8.1)

## 2014-11-01 LAB — URINE MICROSCOPIC-ADD ON

## 2014-11-01 LAB — CBC
HEMATOCRIT: 43.2 % (ref 39.0–52.0)
Hemoglobin: 14.6 g/dL (ref 13.0–17.0)
MCH: 31.7 pg (ref 26.0–34.0)
MCHC: 33.8 g/dL (ref 30.0–36.0)
MCV: 93.7 fL (ref 78.0–100.0)
Platelets: 171 10*3/uL (ref 150–400)
RBC: 4.61 MIL/uL (ref 4.22–5.81)
RDW: 12.9 % (ref 11.5–15.5)
WBC: 6.7 10*3/uL (ref 4.0–10.5)

## 2014-11-01 LAB — LIPASE, BLOOD: Lipase: 38 U/L (ref 22–51)

## 2014-11-01 MED ORDER — SODIUM CHLORIDE 0.9 % IV BOLUS (SEPSIS)
1000.0000 mL | Freq: Once | INTRAVENOUS | Status: AC
  Administered 2014-11-01: 1000 mL via INTRAVENOUS

## 2014-11-01 MED ORDER — IOHEXOL 300 MG/ML  SOLN
50.0000 mL | Freq: Once | INTRAMUSCULAR | Status: AC | PRN
  Administered 2014-11-01: 50 mL via ORAL

## 2014-11-01 MED ORDER — IOHEXOL 300 MG/ML  SOLN
50.0000 mL | Freq: Once | INTRAMUSCULAR | Status: AC | PRN
  Administered 2014-11-01: 50 mL via INTRAVENOUS

## 2014-11-01 NOTE — ED Notes (Signed)
Pt c/o intermittent epigastric pain and intermittent diarrhea x 2 weeks.  Pain score 2/10.  Pt reports having a CT in January that showed gallstones.  Pt had L kidney removed in March.

## 2014-11-01 NOTE — ED Provider Notes (Signed)
CSN: [id number]     Arrival date & time 11/01/14  0955 History   First MD Initiated Contact with Patient 11/01/14 1021     Chief Complaint  Patient presents with  . Abdominal Pain  . Diarrhea     (Consider location/radiation/quality/duration/timing/severity/associated sxs/prior Treatment) Patient is a 74 y.o. male presenting with abdominal pain and diarrhea. The history is provided by the patient.  Abdominal Pain Pain location:  Epigastric Pain quality: dull   Pain quality: not aching, not bloating and not burning   Pain radiates to:  Does not radiate Pain severity:  Mild Onset quality:  Gradual Duration:  1 week Timing:  Constant Progression:  Unchanged Chronicity:  New Relieved by:  Nothing Worsened by:  Nothing tried Ineffective treatments:  None tried Associated symptoms: diarrhea (loose stools)   Associated symptoms: no chest pain, no chills, no fever, no nausea, no shortness of breath and no vomiting   Diarrhea Associated symptoms: abdominal pain (epigastric)   Associated symptoms: no arthralgias, no chills, no fever, no headaches, no myalgias and no vomiting    74 yo M with a chief complaint of epigastric abdominal pain. Patient states this is been off and on since his nephrectomy for a renal mass. Patient called his PCP today who was concerned that maybe this was his gallbladder and sluggish compared to the emergency department. Patient states the pain is mild at worst. Patient denies fevers chills denies vomiting has had a couple loose stools though not more frequent than his normal bowel movements. Patient denies worsening with food consumption. Patient states it's more of a lingering pain and stays persistently. The patient thought it might be secondary to his incision site. Incidental finding on CT scan of gallstones is why they're concerned for possible gallbladder pathology.  Past Medical History  Diagnosis Date  . Hypertension   . Hyperlipidemia   . Seizures      20 YRS AGO due to ETOH abuse - no Alcohol x 20 yrs  . Renal mass, left   . BPH (benign prostatic hypertrophy)    Past Surgical History  Procedure Laterality Date  . Tonsillectomy    . Appendectomy  1974  . Hernia repair  2002  . Cataracts removed  2014  . Robot assisted laparoscopic nephrectomy Left 06/15/2014    Procedure: ROBOTIC ASSISTED LAPAROSCOPIC NEPHRECTOMY;  Surgeon: Alexis Frock, MD;  Location: [facility];  Service: Urology;  Laterality: Left;  . Inguinal hernia repair Right 06/15/2014    Procedure: HERNIA REPAIR INGUINAL ADULT;  Surgeon: Coralie Keens, MD;  Location: [facility];  Service: General;  Laterality: Right;  . Insertion of mesh Right 06/15/2014    Procedure: INSERTION OF MESH;  Surgeon: Coralie Keens, MD;  Location: [facility];  Service: General;  Laterality: Right;   History reviewed. No pertinent family history. History  Substance Use Topics  . Smoking status: Never Smoker   . Smokeless tobacco: Not on file  . 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  HENT: Negative for congestion and facial swelling.   Eyes: Negative for discharge and visual disturbance.  Respiratory: Negative for shortness of breath.   Cardiovascular: Negative for chest pain and palpitations.  Gastrointestinal: Positive for abdominal pain (epigastric) and diarrhea (loose stools). Negative for nausea and vomiting.  Musculoskeletal: Negative for myalgias and arthralgias.  Skin: Negative for color change and rash.  Neurological: Negative for  tremors, syncope and headaches.  Psychiatric/Behavioral: Negative for confusion and dysphoric mood.      Allergies  Review of patient's allergies indicates no known allergies.  Home Medications   Prior to Admission medications   Medication Sig Start Date End Date Taking? Authorizing Provider  amLODipine (NORVASC) 5 MG tablet Take 5 mg by mouth at  bedtime.   Yes Historical Provider, MD  Multiple Vitamins-Minerals (CENTRUM SILVER ADULT 50+ PO) Take 1 tablet by mouth daily.   Yes Historical Provider, MD  simvastatin (ZOCOR) 20 MG tablet Take 20 mg by mouth at bedtime.   Yes Historical Provider, MD  HYDROcodone-acetaminophen (NORCO) 5-325 MG per tablet Take 1-2 tablets by mouth every 6 (six) hours as needed. Patient not taking: Reported on 11/01/2014 06/15/14   Debbrah Alar, PA-C  sodium chloride 1 G tablet Take 1 tablet (1 g total) by mouth 3 (three) times daily with meals. Please see discharge instructions Patient not taking: Reported on 11/01/2014 06/20/14   Ripudeep Krystal Eaton, MD  tamsulosin (FLOMAX) 0.4 MG CAPS capsule Take 1 capsule (0.4 mg total) by mouth daily. Patient not taking: Reported on 11/01/2014 06/20/14   Ripudeep Krystal Eaton, MD  traMADol (ULTRAM) 50 MG tablet Take 2 tablets (100 mg total) by mouth every 8 (eight) hours as needed for moderate pain. Patient not taking: Reported on 11/01/2014 06/20/14   Ripudeep K Rai, MD   BP 144/81 mmHg  Pulse 71  Temp(Src) 97.8 F (36.6 C) (Oral)  Resp 18  SpO2 100% Physical Exam  Constitutional: He is oriented to person, place, and time. He appears well-developed and well-nourished.  HENT:  Head: Normocephalic and atraumatic.  Eyes: EOM are normal. Pupils are equal, round, and reactive to light.  Neck: Normal range of motion. Neck supple. No JVD present.  Cardiovascular: Normal rate and regular rhythm.  Exam reveals no gallop and no friction rub.   No murmur heard. Pulmonary/Chest: No respiratory distress. He has no wheezes.  Abdominal: He exhibits no distension. There is tenderness (epigastric). There is no rebound and no guarding.  Negative Murphy sign no noted right upper quadrant tenderness  Musculoskeletal: Normal range of motion.  Neurological: He is alert and oriented to person, place, and time.  Skin: No rash noted. No pallor.  Psychiatric: He has a normal mood and affect. His behavior is  normal.    ED Course  Procedures (including critical care time) Labs Review Labs Reviewed  COMPREHENSIVE METABOLIC PANEL - Abnormal; Notable for the following:    Glucose, Bld 110 (*)    Creatinine, Ser 1.58 (*)    GFR calc non Af Amer 41 (*)    GFR calc Af Amer 47 (*)    All other components within normal limits  URINALYSIS, ROUTINE W REFLEX MICROSCOPIC (NOT AT [facility]) - Abnormal; Notable for the following:    Protein, ur 100 (*)    Leukocytes, UA TRACE (*)    All other components within normal limits  LIPASE, BLOOD  CBC  URINE MICROSCOPIC-ADD ON    Imaging Review Ct Abdomen Pelvis W Contrast  11/01/2014   CLINICAL DATA:  74 year old hypertensive male with epigastric pain and intermittent diarrhea for 2 weeks. Post left nephrectomy for renal cell carcinoma March 2016. Initial encounter.  EXAM: CT ABDOMEN AND PELVIS WITH CONTRAST  TECHNIQUE: Multidetector CT imaging of the abdomen and pelvis was performed using the standard protocol following bolus administration of intravenous contrast.  CONTRAST:  26mL OMNIPAQUE IOHEXOL 300 MG/ML  SOLN  COMPARISON:  04/26/2014.  FINDINGS: No extra luminal bowel inflammatory process, free fluid or free air. Appendix not visualized. Stomach is under distended and evaluation of the gastric fundus/ proximal body is therefore limited.  Scarring/atelectatic changes lung bases.  Heart size within normal limits.  Coronary artery calcifications.  Post left nephrectomy without recurrence at the nephrectomy site.  Right renal 1 cm low-density structure suggestive of a cyst unchanged.  Pancreatic duct top-normal in size. Pancreatic tail 1 cm low-density structure unchanged of questionable etiology.  Right adrenal gland normal. Left adrenal gland may been removed at the time surgery.  No hepatic or splenic lesion.  Multiple calcified gallstones without CT evidence of gallbladder inflammation. If this is of concern, ultrasound recommended for further delineation.   Atherosclerotic type changes of the aorta and iliac artery without aneurysmal dilation.  No adenopathy.  Noncontrast filled views of the urinary bladder unremarkable.  Slightly lobulated prostate gland with calcifications. Clinical and laboratory correlation recommended.  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 Moderate L4-5 neural foraminal narrowing. Bilateral hip joint degenerative changes. No osseous destructive lesion.  IMPRESSION: Post left nephrectomy without recurrence at the nephrectomy site. No evidence of metastatic disease.  Right renal 1 cm low-density structure suggestive of a cyst unchanged.  Pancreatic duct top-normal in size. Pancreatic tail 1 cm low-density structure unchanged of questionable etiology.  Several calcified gallstones without CT evidence of gallbladder inflammation. If this were of concern ultrasound may then be considered.  No extra luminal bowel inflammatory process, free fluid or free air. Appendix not visualized. Stomach is under distended and evaluation of the gastric fundus/ proximal body is therefore limited.  Slightly lobulated prostate gland with calcifications. Clinical and laboratory correlation recommended.  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 Moderate L4-5 neural foraminal narrowing.   Electronically Signed   By: Genia Del M.D.   On: 11/01/2014 12:19     EKG Interpretation None      MDM   Final diagnoses:  Epigastric abdominal pain    74 yo M with a chief complaint of epigastric abdominal pain. Patient has no pain in the right upper quadrant negative Murphy sign. We'll obtain a CT scan to rule out recurrence of the cancer versus retained abscess.  CT scan negative for acute findings.  Gallbladder with stones. Patient feeling better.  Repeat exam without RUQ ttp.  Will follow up with PCP for possible outpatient [place].   I have discussed the diagnosis/risks/treatment options with the patient and believe the pt to be  eligible for discharge home to follow-up with PCP. We also discussed returning to the ED immediately if new or worsening sx occur. We discussed the sx which are most concerning (e.g., sudden worsening pain, fever) that necessitate immediate return. Medications administered to the patient during their visit and any new prescriptions provided to the patient are listed below.  Medications given during this visit Medications  sodium chloride 0.9 % bolus 1,000 mL (0 mLs Intravenous Stopped 11/01/14 1258)  iohexol (OMNIPAQUE) 300 MG/ML solution 50 mL (50 mLs Oral Contrast Given 11/01/14 1057)  iohexol (OMNIPAQUE) 300 MG/ML solution 50 mL (50 mLs Intravenous Contrast Given 11/01/14 1144)    Discharge Medication List as of 11/01/2014 12:25 PM       The patient appears reasonably screen and/or stabilized for discharge and I doubt any other medical condition or other [facility] requiring further screening, evaluation, or treatment in the ED at this time prior to discharge.  Deno Etienne, DO 11/01/14 1538

## 2014-11-01 NOTE — Discharge Instructions (Signed)

## 2014-12-10 DIAGNOSIS — Z23 Encounter for immunization: Secondary | ICD-10-CM | POA: Diagnosis not present

## 2014-12-31 DIAGNOSIS — C649 Malignant neoplasm of unspecified kidney, except renal pelvis: Secondary | ICD-10-CM | POA: Diagnosis not present

## 2015-02-01 DIAGNOSIS — M79671 Pain in right foot: Secondary | ICD-10-CM | POA: Diagnosis not present

## 2015-02-01 DIAGNOSIS — M6289 Other specified disorders of muscle: Secondary | ICD-10-CM | POA: Diagnosis not present

## 2015-02-01 DIAGNOSIS — M79672 Pain in left foot: Secondary | ICD-10-CM | POA: Diagnosis not present

## 2015-03-29 DIAGNOSIS — Z905 Acquired absence of kidney: Secondary | ICD-10-CM | POA: Diagnosis not present

## 2015-03-29 DIAGNOSIS — C642 Malignant neoplasm of left kidney, except renal pelvis: Secondary | ICD-10-CM | POA: Diagnosis not present

## 2015-07-25 DIAGNOSIS — R079 Chest pain, unspecified: Secondary | ICD-10-CM | POA: Diagnosis not present

## 2015-07-25 DIAGNOSIS — I10 Essential (primary) hypertension: Secondary | ICD-10-CM | POA: Diagnosis not present

## 2015-09-10 DIAGNOSIS — Z9842 Cataract extraction status, left eye: Secondary | ICD-10-CM | POA: Diagnosis not present

## 2015-09-10 DIAGNOSIS — Z961 Presence of intraocular lens: Secondary | ICD-10-CM | POA: Diagnosis not present

## 2015-09-13 DIAGNOSIS — R61 Generalized hyperhidrosis: Secondary | ICD-10-CM | POA: Diagnosis not present

## 2015-09-13 DIAGNOSIS — Z0000 Encounter for general adult medical examination without abnormal findings: Secondary | ICD-10-CM | POA: Diagnosis not present

## 2015-09-13 DIAGNOSIS — C649 Malignant neoplasm of unspecified kidney, except renal pelvis: Secondary | ICD-10-CM | POA: Diagnosis not present

## 2015-09-13 DIAGNOSIS — R739 Hyperglycemia, unspecified: Secondary | ICD-10-CM | POA: Diagnosis not present

## 2015-09-13 DIAGNOSIS — I10 Essential (primary) hypertension: Secondary | ICD-10-CM | POA: Diagnosis not present

## 2015-09-13 DIAGNOSIS — R748 Abnormal levels of other serum enzymes: Secondary | ICD-10-CM | POA: Diagnosis not present

## 2015-09-13 DIAGNOSIS — N400 Enlarged prostate without lower urinary tract symptoms: Secondary | ICD-10-CM | POA: Diagnosis not present

## 2015-09-13 DIAGNOSIS — E785 Hyperlipidemia, unspecified: Secondary | ICD-10-CM | POA: Diagnosis not present

## 2015-09-19 ENCOUNTER — Ambulatory Visit (HOSPITAL_COMMUNITY)
Admission: RE | Admit: 2015-09-19 | Discharge: 2015-09-19 | Disposition: A | Discharge status: Home / Self Care | Payer: Medicare Other | Source: Ambulatory Visit | Attending: Urology | Admitting: Urology

## 2015-09-19 ENCOUNTER — Other Ambulatory Visit: Payer: Self-pay | Admitting: Urology

## 2015-09-19 DIAGNOSIS — I10 Essential (primary) hypertension: Secondary | ICD-10-CM | POA: Diagnosis not present

## 2015-09-19 DIAGNOSIS — C642 Malignant neoplasm of left kidney, except renal pelvis: Secondary | ICD-10-CM | POA: Insufficient documentation

## 2015-09-19 DIAGNOSIS — R938 Abnormal findings on diagnostic imaging of other specified body structures: Secondary | ICD-10-CM | POA: Diagnosis not present

## 2015-09-19 DIAGNOSIS — K8020 Calculus of gallbladder without cholecystitis without obstruction: Secondary | ICD-10-CM | POA: Diagnosis not present

## 2015-09-26 DIAGNOSIS — N281 Cyst of kidney, acquired: Secondary | ICD-10-CM | POA: Diagnosis not present

## 2015-09-26 DIAGNOSIS — C642 Malignant neoplasm of left kidney, except renal pelvis: Secondary | ICD-10-CM | POA: Diagnosis not present

## 2015-09-26 DIAGNOSIS — Z905 Acquired absence of kidney: Secondary | ICD-10-CM | POA: Diagnosis not present

## 2015-11-18 DIAGNOSIS — I10 Essential (primary) hypertension: Secondary | ICD-10-CM | POA: Diagnosis not present

## 2015-11-18 DIAGNOSIS — E785 Hyperlipidemia, unspecified: Secondary | ICD-10-CM | POA: Diagnosis not present

## 2015-11-18 DIAGNOSIS — N400 Enlarged prostate without lower urinary tract symptoms: Secondary | ICD-10-CM | POA: Diagnosis not present

## 2015-11-18 DIAGNOSIS — R739 Hyperglycemia, unspecified: Secondary | ICD-10-CM | POA: Diagnosis not present

## 2015-11-18 DIAGNOSIS — R748 Abnormal levels of other serum enzymes: Secondary | ICD-10-CM | POA: Diagnosis not present

## 2015-11-18 DIAGNOSIS — C649 Malignant neoplasm of unspecified kidney, except renal pelvis: Secondary | ICD-10-CM | POA: Diagnosis not present

## 2015-12-13 DIAGNOSIS — Z23 Encounter for immunization: Secondary | ICD-10-CM | POA: Diagnosis not present

## 2016-01-24 DIAGNOSIS — E785 Hyperlipidemia, unspecified: Secondary | ICD-10-CM | POA: Diagnosis not present

## 2016-01-24 DIAGNOSIS — N400 Enlarged prostate without lower urinary tract symptoms: Secondary | ICD-10-CM | POA: Diagnosis not present

## 2016-01-24 DIAGNOSIS — R748 Abnormal levels of other serum enzymes: Secondary | ICD-10-CM | POA: Diagnosis not present

## 2016-01-24 DIAGNOSIS — R739 Hyperglycemia, unspecified: Secondary | ICD-10-CM | POA: Diagnosis not present

## 2016-01-24 DIAGNOSIS — I10 Essential (primary) hypertension: Secondary | ICD-10-CM | POA: Diagnosis not present

## 2016-01-24 DIAGNOSIS — C649 Malignant neoplasm of unspecified kidney, except renal pelvis: Secondary | ICD-10-CM | POA: Diagnosis not present

## 2016-03-19 DIAGNOSIS — R739 Hyperglycemia, unspecified: Secondary | ICD-10-CM | POA: Diagnosis not present

## 2016-03-19 DIAGNOSIS — N400 Enlarged prostate without lower urinary tract symptoms: Secondary | ICD-10-CM | POA: Diagnosis not present

## 2016-03-19 DIAGNOSIS — I10 Essential (primary) hypertension: Secondary | ICD-10-CM | POA: Diagnosis not present

## 2016-03-19 DIAGNOSIS — R748 Abnormal levels of other serum enzymes: Secondary | ICD-10-CM | POA: Diagnosis not present

## 2016-03-19 DIAGNOSIS — Z125 Encounter for screening for malignant neoplasm of prostate: Secondary | ICD-10-CM | POA: Diagnosis not present

## 2016-03-19 DIAGNOSIS — E785 Hyperlipidemia, unspecified: Secondary | ICD-10-CM | POA: Diagnosis not present

## 2016-03-19 DIAGNOSIS — C649 Malignant neoplasm of unspecified kidney, except renal pelvis: Secondary | ICD-10-CM | POA: Diagnosis not present

## 2016-07-04 ENCOUNTER — Emergency Department (HOSPITAL_COMMUNITY): Payer: Medicare Other

## 2016-07-04 ENCOUNTER — Inpatient Hospital Stay (HOSPITAL_COMMUNITY)
Admission: EM | Admit: 2016-07-04 | Discharge: 2016-07-06 | DRG: 390 | Disposition: A | Discharge status: Home / Self Care | Payer: Medicare Other | Attending: General Surgery | Admitting: General Surgery

## 2016-07-04 ENCOUNTER — Inpatient Hospital Stay (HOSPITAL_COMMUNITY): Payer: Medicare Other

## 2016-07-04 ENCOUNTER — Encounter (HOSPITAL_COMMUNITY): Payer: Self-pay | Admitting: Emergency Medicine

## 2016-07-04 DIAGNOSIS — N400 Enlarged prostate without lower urinary tract symptoms: Secondary | ICD-10-CM | POA: Diagnosis present

## 2016-07-04 DIAGNOSIS — Z0189 Encounter for other specified special examinations: Secondary | ICD-10-CM

## 2016-07-04 DIAGNOSIS — Z79899 Other long term (current) drug therapy: Secondary | ICD-10-CM

## 2016-07-04 DIAGNOSIS — Z905 Acquired absence of kidney: Secondary | ICD-10-CM | POA: Diagnosis not present

## 2016-07-04 DIAGNOSIS — I10 Essential (primary) hypertension: Secondary | ICD-10-CM | POA: Diagnosis present

## 2016-07-04 DIAGNOSIS — K56609 Unspecified intestinal obstruction, unspecified as to partial versus complete obstruction: Secondary | ICD-10-CM | POA: Diagnosis present

## 2016-07-04 DIAGNOSIS — Z9049 Acquired absence of other specified parts of digestive tract: Secondary | ICD-10-CM

## 2016-07-04 DIAGNOSIS — E785 Hyperlipidemia, unspecified: Secondary | ICD-10-CM | POA: Diagnosis present

## 2016-07-04 DIAGNOSIS — Z85528 Personal history of other malignant neoplasm of kidney: Secondary | ICD-10-CM

## 2016-07-04 DIAGNOSIS — K8020 Calculus of gallbladder without cholecystitis without obstruction: Secondary | ICD-10-CM | POA: Diagnosis not present

## 2016-07-04 DIAGNOSIS — K5669 Other partial intestinal obstruction: Secondary | ICD-10-CM | POA: Diagnosis not present

## 2016-07-04 DIAGNOSIS — K56699 Other intestinal obstruction unspecified as to partial versus complete obstruction: Secondary | ICD-10-CM | POA: Diagnosis not present

## 2016-07-04 DIAGNOSIS — Z4682 Encounter for fitting and adjustment of non-vascular catheter: Secondary | ICD-10-CM | POA: Diagnosis not present

## 2016-07-04 DIAGNOSIS — K565 Intestinal adhesions [bands], unspecified as to partial versus complete obstruction: Principal | ICD-10-CM | POA: Diagnosis present

## 2016-07-04 DIAGNOSIS — R109 Unspecified abdominal pain: Secondary | ICD-10-CM

## 2016-07-04 LAB — URINALYSIS, ROUTINE W REFLEX MICROSCOPIC
BACTERIA UA: NONE SEEN
Bilirubin Urine: NEGATIVE
GLUCOSE, UA: NEGATIVE mg/dL
Hgb urine dipstick: NEGATIVE
KETONES UR: NEGATIVE mg/dL
LEUKOCYTES UA: NEGATIVE
Nitrite: NEGATIVE
PROTEIN: 100 mg/dL — AB
SQUAMOUS EPITHELIAL / LPF: NONE SEEN
Specific Gravity, Urine: 1.019 (ref 1.005–1.030)
pH: 5 (ref 5.0–8.0)

## 2016-07-04 LAB — CBC WITH DIFFERENTIAL/PLATELET
BASOS ABS: 0 10*3/uL (ref 0.0–0.1)
BASOS PCT: 0 %
Eosinophils Absolute: 0 10*3/uL (ref 0.0–0.7)
Eosinophils Relative: 0 %
HEMATOCRIT: 43.9 % (ref 39.0–52.0)
Hemoglobin: 15.7 g/dL (ref 13.0–17.0)
LYMPHS PCT: 10 %
Lymphs Abs: 0.9 10*3/uL (ref 0.7–4.0)
MCH: 32.3 pg (ref 26.0–34.0)
MCHC: 35.8 g/dL (ref 30.0–36.0)
MCV: 90.3 fL (ref 78.0–100.0)
Monocytes Absolute: 0.5 10*3/uL (ref 0.1–1.0)
Monocytes Relative: 5 %
NEUTROS ABS: 7.7 10*3/uL (ref 1.7–7.7)
NEUTROS PCT: 85 %
Platelets: 175 10*3/uL (ref 150–400)
RBC: 4.86 MIL/uL (ref 4.22–5.81)
RDW: 13.3 % (ref 11.5–15.5)
WBC: 9.1 10*3/uL (ref 4.0–10.5)

## 2016-07-04 LAB — COMPREHENSIVE METABOLIC PANEL
ALBUMIN: 4.4 g/dL (ref 3.5–5.0)
ALT: 29 U/L (ref 17–63)
AST: 29 U/L (ref 15–41)
Alkaline Phosphatase: 78 U/L (ref 38–126)
Anion gap: 11 (ref 5–15)
BILIRUBIN TOTAL: 0.8 mg/dL (ref 0.3–1.2)
BUN: 22 mg/dL — AB (ref 6–20)
CHLORIDE: 99 mmol/L — AB (ref 101–111)
CO2: 24 mmol/L (ref 22–32)
CREATININE: 1.77 mg/dL — AB (ref 0.61–1.24)
Calcium: 9.9 mg/dL (ref 8.9–10.3)
GFR calc Af Amer: 41 mL/min — ABNORMAL LOW (ref >60)
GFR, EST NON AFRICAN AMERICAN: 35 mL/min — AB (ref >60)
GLUCOSE: 192 mg/dL — AB (ref 65–99)
Potassium: 3.6 mmol/L (ref 3.5–5.1)
Sodium: 134 mmol/L — ABNORMAL LOW (ref 135–145)
TOTAL PROTEIN: 7.8 g/dL (ref 6.5–8.1)

## 2016-07-04 LAB — LIPASE, BLOOD: LIPASE: 24 U/L (ref 11–51)

## 2016-07-04 MED ORDER — ONDANSETRON 4 MG PO TBDP: 4.0000 mg | ORAL_TABLET | Freq: Four times a day (QID) | ORAL | Status: DC | PRN

## 2016-07-04 MED ORDER — ONDANSETRON HCL 4 MG/2ML IJ SOLN: 4.0000 mg | Freq: Four times a day (QID) | INTRAMUSCULAR | Status: DC | PRN

## 2016-07-04 MED ORDER — IOPAMIDOL (ISOVUE-300) INJECTION 61%
15.0000 mL | Freq: Once | INTRAVENOUS | Status: AC | PRN
  Administered 2016-07-04: 15 mL via ORAL

## 2016-07-04 MED ORDER — ONDANSETRON HCL 4 MG/2ML IJ SOLN
4.0000 mg | Freq: Once | INTRAMUSCULAR | Status: AC
  Administered 2016-07-04: 4 mg via INTRAVENOUS
  Filled 2016-07-04: qty 2

## 2016-07-04 MED ORDER — ENOXAPARIN SODIUM 40 MG/0.4ML ~~LOC~~ SOLN
40.0000 mg | SUBCUTANEOUS | Status: DC
  Administered 2016-07-04 – 2016-07-05 (×2): 40 mg via SUBCUTANEOUS
  Filled 2016-07-04 (×2): qty 0.4

## 2016-07-04 MED ORDER — MORPHINE SULFATE (PF) 2 MG/ML IV SOLN
4.0000 mg | Freq: Once | INTRAVENOUS | Status: AC
  Administered 2016-07-04: 4 mg via INTRAVENOUS
  Filled 2016-07-04: qty 2

## 2016-07-04 MED ORDER — DEXTROSE-NACL 5-0.9 % IV SOLN
INTRAVENOUS | Status: DC
  Administered 2016-07-04 – 2016-07-06 (×5): via INTRAVENOUS

## 2016-07-04 MED ORDER — SODIUM CHLORIDE 0.9 % IV SOLN
INTRAVENOUS | Status: DC
  Administered 2016-07-04: 04:00:00 via INTRAVENOUS

## 2016-07-04 MED ORDER — DIATRIZOATE MEGLUMINE & SODIUM 66-10 % PO SOLN
90.0000 mL | Freq: Once | ORAL | Status: AC
  Administered 2016-07-04: 90 mL via NASOGASTRIC
  Filled 2016-07-04: qty 90

## 2016-07-04 NOTE — ED Triage Notes (Signed)
From home via EMS.  Reports epigastric aching abd pain starting around 3-4 pm, reports having sandwich around noon.  Denies N/V or diarrhea/constipation.  Hx htn and left kidney removal.  Also reports lower back pain unrelated to abd pain. VS: 138/76, 68 bpm, 97% RA

## 2016-07-04 NOTE — H&P (Signed)
Taylor Dillon is an 76 y.o. male.   Chief Complaint: abdominal pain  HPI: Pt is a 76 y/o M s/p L nephrectomy in 05/2014 for kidney mass, and h/o appendectomy and [facility] 2016.  Pt states he had 1d h/o abdominal pain, n/v.  He states no constipation.  He last had  A BM 1 d ago.  Pt was seen in ED 2/2 to con't abdominal pain n/v.  ED eval with CT revealed SBO with transition point to  RLQ, no WBC count.  Gen surgery was consulted for further eval and mgmt.  Past Medical History:  Diagnosis Date  . BPH (benign prostatic hypertrophy)   . Hyperlipidemia   . Hypertension   . Renal mass, left   . Seizures ([place])    20 YRS AGO due to ETOH abuse - no Alcohol x 20 yrs    Past Surgical History:  Procedure Laterality Date  . APPENDECTOMY  1974  . CATARACTS REMOVED  2014  . HERNIA REPAIR  2002  . INGUINAL HERNIA REPAIR Right 06/15/2014   Procedure: HERNIA REPAIR INGUINAL ADULT;  Surgeon: Coralie Keens, MD;  Location: [facility];  Service: General;  Laterality: Right;  . INSERTION OF MESH Right 06/15/2014   Procedure: INSERTION OF MESH;  Surgeon: Coralie Keens, MD;  Location: [facility];  Service: General;  Laterality: Right;  . ROBOT ASSISTED LAPAROSCOPIC NEPHRECTOMY Left 06/15/2014   Procedure: ROBOTIC ASSISTED LAPAROSCOPIC NEPHRECTOMY;  Surgeon: Alexis Frock, MD;  Location: [facility];  Service: Urology;  Laterality: Left;  . TONSILLECTOMY      No family history on file. Social History:  reports that he has never smoked. He does not have any smokeless tobacco history on file. He reports that he does not drink alcohol or use drugs.  Allergies: No Known Allergies   (Not in a hospital admission)  Results for orders placed or performed during the hospital encounter of 07/04/16 (from the past 48 hour(s))  CBC with Differential/Platelet     Status: None   Collection Time: 07/04/16  3:31 AM  Result Value Ref Range   WBC 9.1 4.0 - 10.5 K/uL   RBC 4.86 4.22 - 5.81 MIL/uL   Hemoglobin 15.7 13.0 -  17.0 g/dL   HCT 43.9 39.0 - 52.0 %   MCV 90.3 78.0 - 100.0 fL   MCH 32.3 26.0 - 34.0 pg   MCHC 35.8 30.0 - 36.0 g/dL   RDW 13.3 11.5 - 15.5 %   Platelets 175 150 - 400 K/uL   Neutrophils Relative % 85 %   Neutro Abs 7.7 1.7 - 7.7 K/uL   Lymphocytes Relative 10 %   Lymphs Abs 0.9 0.7 - 4.0 K/uL   Monocytes Relative 5 %   Monocytes Absolute 0.5 0.1 - 1.0 K/uL   Eosinophils Relative 0 %   Eosinophils Absolute 0.0 0.0 - 0.7 K/uL   Basophils Relative 0 %   Basophils Absolute 0.0 0.0 - 0.1 K/uL  Comprehensive metabolic panel     Status: Abnormal   Collection Time: 07/04/16  3:31 AM  Result Value Ref Range   Sodium 134 (L) 135 - 145 mmol/L   Potassium 3.6 3.5 - 5.1 mmol/L   Chloride 99 (L) 101 - 111 mmol/L   CO2 24 22 - 32 mmol/L   Glucose, Bld 192 (H) 65 - 99 mg/dL   BUN 22 (H) 6 - 20 mg/dL   Creatinine, Ser 1.77 (H) 0.61 - 1.24 mg/dL   Calcium 9.9 8.9 - 10.3 mg/dL  Total Protein 7.8 6.5 - 8.1 g/dL   Albumin 4.4 3.5 - 5.0 g/dL   AST 29 15 - 41 U/L   ALT 29 17 - 63 U/L   Alkaline Phosphatase 78 38 - 126 U/L   Total Bilirubin 0.8 0.3 - 1.2 mg/dL   GFR calc non Af Amer 35 (L) >60 mL/min   GFR calc Af Amer 41 (L) >60 mL/min    Comment: (NOTE) The eGFR has been calculated using the CKD EPI equation. This calculation has not been validated in all clinical situations. eGFR's persistently <60 mL/min signify possible Chronic Kidney Disease.    Anion gap 11 5 - 15  Lipase, blood     Status: None   Collection Time: 07/04/16  3:31 AM  Result Value Ref Range   Lipase 24 11 - 51 U/L   Ct Abdomen Pelvis Wo Contrast  Result Date: 07/04/2016 CLINICAL DATA:  Epigastric pain and distention, onset today. EXAM: CT ABDOMEN AND PELVIS WITHOUT CONTRAST TECHNIQUE: Multidetector CT imaging of the abdomen and pelvis was performed following the standard protocol without IV contrast. COMPARISON:  09/19/2015 FINDINGS: Lower chest: Mild dependent lung base opacities bilaterally may be atelectatic. No  suspicious nodules. Hepatobiliary: Multiple calculi in the gallbladder lumen measuring up to 5 mm. No bile duct dilatation. No focal liver lesions on unenhanced scanning. Pancreas: Unremarkable. No pancreatic ductal dilatation or surrounding inflammatory changes. Spleen: Normal in size without focal abnormality. Adrenals/Urinary Tract: Unremarkable appearances of the left nephrectomy bed. Right kidney appears normal. No urinary calculi. Ureter and urinary bladder are unremarkable. Mild thickening of the left adrenal. Normal right adrenal. Stomach/Bowel: Abnormal dilatation and stacking of small bowel loops with abrupt transition to decompressed ileum. At the transition, no mass or hernia is evident. The transition is visible on coronal series 5, image 53 and axial series 2, image 49. Colon is decompressed. Stomach is moderately distended. Vascular/Lymphatic: The abdominal aorta is normal in caliber with moderate atherosclerotic calcification. No adenopathy is evident in the abdomen or pelvis. Reproductive: Unremarkable Other: No ascites.  No extraluminal air. Musculoskeletal: No significant skeletal lesion IMPRESSION: 1. Small bowel obstruction, probably due to adhesions. Transition point is located in the right mid abdomen. 2. Cholelithiasis 3. Aortic atherosclerosis 4. Unremarkable appearances of the left nephrectomy bed. Electronically Signed   By: Andreas Newport M.D.   On: 07/04/2016 04:54   Dg Abd Portable 1v  Result Date: 07/04/2016 CLINICAL DATA:  NG tube placement EXAM: PORTABLE ABDOMEN - 1 VIEW COMPARISON:  None. FINDINGS: The nasogastric tube extends well into the stomach. Dilated stacked loops of small bowel are again evident, consistent with obstruction. Multiple gallbladder calculi are visible. IMPRESSION: Nasogastric tube extends well into the stomach. Cholelithiasis. Dilated small bowel consistent with obstruction. Electronically Signed   By: Andreas Newport M.D.   On: 07/04/2016 06:33     Review of Systems  Constitutional: Negative for chills, fever, malaise/fatigue and weight loss.  HENT: Negative for ear discharge, ear pain, hearing loss and tinnitus.   Eyes: Negative for blurred vision, double vision and photophobia.  Respiratory: Negative for cough, hemoptysis, sputum production and shortness of breath.   Cardiovascular: Negative for chest pain, palpitations, orthopnea and claudication.  Gastrointestinal: Positive for abdominal pain, nausea and vomiting. Negative for blood in stool, constipation, diarrhea, heartburn and melena.  Genitourinary: Negative for dysuria, frequency, hematuria and urgency.  Musculoskeletal: Negative for back pain, joint pain, myalgias and neck pain.  Psychiatric/Behavioral: Negative for depression, hallucinations, substance abuse and suicidal ideas. The  patient is not nervous/anxious.   All other systems reviewed and are negative.   Blood pressure (!) 157/99, pulse 88, temperature 97.7 F (36.5 C), temperature source Oral, resp. rate (!) 21, height _0  (1.753 m), weight 65.8 kg (145 lb), SpO2 92 %. Physical Exam  Constitutional: He is oriented to person, place, and time. He appears well-developed and well-nourished. No distress.  HENT:  Head: Normocephalic and atraumatic.  Right Ear: External ear normal.  Left Ear: External ear normal.  Nose: Nose normal.  Mouth/Throat: Oropharynx is clear and moist.  Eyes: Conjunctivae and EOM are normal. Pupils are equal, round, and reactive to light. Right eye exhibits no discharge. Left eye exhibits no discharge. No scleral icterus.  Neck: Normal range of motion. Neck supple. No JVD present. No tracheal deviation present. No thyromegaly present.  Cardiovascular: Normal rate, regular rhythm and intact distal pulses.  Exam reveals no gallop and no friction rub.   No murmur heard. Respiratory: Effort normal and breath sounds normal. No stridor. No respiratory distress. He has no wheezes. He has no  rales. He exhibits no tenderness.  GI: Soft. Bowel sounds are normal. He exhibits no distension and no mass. There is no tenderness. There is no rebound and no guarding.  Musculoskeletal: Normal range of motion. He exhibits no edema, tenderness or deformity.  Neurological: He is alert and oriented to person, place, and time.  Skin: Skin is warm and dry. He is not diaphoretic.     Assessment/Plan 76 y/o M with SBO likely to adhesions HTN HLD  1. Will admit to floor 2. NPO, NGT, IVF 3. Start SBO protocol.  I d/w him that this may require surgery in 48hr based on his progression or lack there of.  He acknowledged understanding.  Reyes Ivan, MD 07/04/2016, 7:26 AM

## 2016-07-04 NOTE — Progress Notes (Signed)
Clarified with Patty RN of ED that pt's NG tube is to remain clamped until scan. Scan should occur approx 6pm

## 2016-07-04 NOTE — ED Provider Notes (Signed)
[facility] DEPT Provider Note   CSN: [id number] Arrival date & time: 07/04/16  6256  By signing my name below, I, Oleh Genin, attest that this documentation has been prepared under the direction and in the presence of Lacretia Leigh, MD. Electronically Signed: Oleh Genin, Scribe. 07/04/16. 3:23 AM.   History   Chief Complaint Chief Complaint  Patient presents with  . Abdominal Pain    HPI Taylor Dillon is a 76 y.o. male with history of HTN and HLD who presents to the ED for evaluation of abdominal pain. This patient states that 12 hours ago he ate a chicken sandwich and took a nap. When he woke he was experiencing "dull, aching" epigastric pain which was worse with movement. His pain has gradually worsened and presents now after it became intolerable. No fever or diarrhea. Reporting nausea without vomiting. He is distended. Unsure of any flatus. He does have history of nephrolithiasis and prior renal mass resection; no hematuria or dark urine. No abdominal surgeries. He has attempted Tylenol without improvement.  The history is provided by the patient. No language interpreter was used.    Past Medical History:  Diagnosis Date  . BPH (benign prostatic hypertrophy)   . Hyperlipidemia   . Hypertension   . Renal mass, left   . Seizures ([place])    20 YRS AGO due to ETOH abuse - no Alcohol x 20 yrs    Patient Active Problem List   Diagnosis Date Noted  . Hyponatremia 06/18/2014  . Renal mass 06/15/2014    Past Surgical History:  Procedure Laterality Date  . APPENDECTOMY  1974  . CATARACTS REMOVED  2014  . HERNIA REPAIR  2002  . INGUINAL HERNIA REPAIR Right 06/15/2014   Procedure: HERNIA REPAIR INGUINAL ADULT;  Surgeon: Coralie Keens, MD;  Location: [facility];  Service: General;  Laterality: Right;  . INSERTION OF MESH Right 06/15/2014   Procedure: INSERTION OF MESH;  Surgeon: Coralie Keens, MD;  Location: [facility];  Service: General;  Laterality: Right;  .  ROBOT ASSISTED LAPAROSCOPIC NEPHRECTOMY Left 06/15/2014   Procedure: ROBOTIC ASSISTED LAPAROSCOPIC NEPHRECTOMY;  Surgeon: Alexis Frock, MD;  Location: [facility];  Service: Urology;  Laterality: Left;  . TONSILLECTOMY         Home Medications    Prior to Admission medications   Medication Sig Start Date End Date Taking? Authorizing Provider  amLODipine (NORVASC) 5 MG tablet Take 5 mg by mouth at bedtime.    Historical Provider, MD  HYDROcodone-acetaminophen (NORCO) 5-325 MG per tablet Take 1-2 tablets by mouth every 6 (six) hours as needed. Patient not taking: Reported on 11/01/2014 06/15/14   Debbrah Alar, PA-C  Multiple Vitamins-Minerals (CENTRUM SILVER ADULT 50+ PO) Take 1 tablet by mouth daily.    Historical Provider, MD  simvastatin (ZOCOR) 20 MG tablet Take 20 mg by mouth at bedtime.    Historical Provider, MD  sodium chloride 1 G tablet Take 1 tablet (1 g total) by mouth 3 (three) times daily with meals. Please see discharge instructions Patient not taking: Reported on 11/01/2014 06/20/14   Ripudeep Krystal Eaton, MD  tamsulosin (FLOMAX) 0.4 MG CAPS capsule Take 1 capsule (0.4 mg total) by mouth daily. Patient not taking: Reported on 11/01/2014 06/20/14   Ripudeep Krystal Eaton, MD  traMADol (ULTRAM) 50 MG tablet Take 2 tablets (100 mg total) by mouth every 8 (eight) hours as needed for moderate pain. Patient not taking: Reported on 11/01/2014 06/20/14   Ripudeep Krystal Eaton, MD  Family History No family history on file.  Social History Social History  Substance Use Topics  . Smoking status: Never Smoker  . Smokeless tobacco: Not on file  . 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   Review of Systems Review of Systems  Constitutional: Negative for fever.  Gastrointestinal: Positive for abdominal distention, abdominal pain and nausea. Negative for diarrhea and vomiting.  Genitourinary: Negative for hematuria.  All other  systems reviewed and are negative.    Physical Exam Updated Vital Signs BP (!) 147/78 (BP Location: Left Arm)   Pulse 68   Temp 97.7 F (36.5 C) (Oral)   Resp 14   Ht 5\' 9"  (1.753 m)   Wt 145 lb (65.8 kg)   SpO2 94%   BMI 21.41 kg/m   Physical Exam  Constitutional: He is oriented to person, place, and time. He appears well-developed and well-nourished.  Non-toxic appearance. No distress.  HENT:  Head: Normocephalic and atraumatic.  Eyes: Conjunctivae, EOM and lids are normal. Pupils are equal, round, and reactive to light.  Neck: Normal range of motion. Neck supple. No tracheal deviation present. No thyroid mass present.  Cardiovascular: Normal rate, regular rhythm and normal heart sounds.  Exam reveals no gallop.   No murmur heard. Pulmonary/Chest: Effort normal and breath sounds normal. No stridor. No respiratory distress. He has no decreased breath sounds. He has no wheezes. He has no rhonchi. He has no rales.  Abdominal: Soft. Normal appearance and bowel sounds are normal. There is no rebound and no CVA tenderness.  Abdomen is distended without peritoneal signs.   Musculoskeletal: Normal range of motion. He exhibits no edema or tenderness.  Neurological: He is alert and oriented to person, place, and time. He has normal strength. No cranial nerve deficit or sensory deficit. GCS eye subscore is 4. GCS verbal subscore is 5. GCS motor subscore is 6.  Skin: Skin is warm and dry. No abrasion and no rash noted.  Psychiatric: He has a normal mood and affect. His speech is normal and behavior is normal.  Nursing note and vitals reviewed.    ED Treatments / Results  Labs (all labs ordered are listed, but only abnormal results are displayed) Labs Reviewed - No data to display  EKG  EKG Interpretation None       Radiology No results found.  Procedures Procedures (including critical care time)  Medications Ordered in ED Medications - No data to display   Initial  Impression / Assessment and Plan / ED Course  I have reviewed the triage vital signs and the nursing notes.  Pertinent labs & imaging results that were available during my care of the patient were reviewed by me and considered in my medical decision making (see chart for details).     Patient has evidence of small bowel obstruction on abdominal CT. NG tube ordered. Discussed with Dr. Rosendo Gros from general surgery will come and see  Final Clinical Impressions(s) / ED Diagnoses   Final diagnoses:  None    New Prescriptions New Prescriptions   No medications on file  I personally performed the services described in this documentation, which was scribed in my presence. The recorded information has been reviewed and is accurate.      Lacretia Leigh, MD 07/04/16 [phone]

## 2016-07-04 NOTE — ED Notes (Signed)
Bed: WA16 Expected date:  Expected time:  Means of arrival:  Comments: ems 

## 2016-07-05 LAB — CBC
HEMATOCRIT: 37 % — AB (ref 39.0–52.0)
Hemoglobin: 12.4 g/dL — ABNORMAL LOW (ref 13.0–17.0)
MCH: 30.2 pg (ref 26.0–34.0)
MCHC: 33.5 g/dL (ref 30.0–36.0)
MCV: 90.2 fL (ref 78.0–100.0)
PLATELETS: 161 10*3/uL (ref 150–400)
RBC: 4.1 MIL/uL — ABNORMAL LOW (ref 4.22–5.81)
RDW: 13.6 % (ref 11.5–15.5)
WBC: 6.8 10*3/uL (ref 4.0–10.5)

## 2016-07-05 LAB — BASIC METABOLIC PANEL
Anion gap: 4 — ABNORMAL LOW (ref 5–15)
BUN: 16 mg/dL (ref 6–20)
CO2: 25 mmol/L (ref 22–32)
CREATININE: 1.51 mg/dL — AB (ref 0.61–1.24)
Calcium: 8.3 mg/dL — ABNORMAL LOW (ref 8.9–10.3)
Chloride: 112 mmol/L — ABNORMAL HIGH (ref 101–111)
GFR calc Af Amer: 50 mL/min — ABNORMAL LOW (ref >60)
GFR, EST NON AFRICAN AMERICAN: 43 mL/min — AB (ref >60)
Glucose, Bld: 128 mg/dL — ABNORMAL HIGH (ref 65–99)
POTASSIUM: 4 mmol/L (ref 3.5–5.1)
SODIUM: 141 mmol/L (ref 135–145)

## 2016-07-05 NOTE — Progress Notes (Signed)
[facility] Office:  [phone] General Surgery Progress Note   LOS: 1 day  POD -     Chief Complaint: Abdominal pain - but all is better - he's had 2 BM's  Assessment and Plan: 1.  SBO  Better on KUB with contrast and air in colon  WBC - 6,800 - 07/05/2016  Improved - to start clear liquids  2.  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 Tresa Moore 3.  HTN 4.  Creatinine better  Creat - 1.51 - 07/05/2016 5.  DVT prophylaxis - Lovenox  Active Problems:   SBO (small bowel obstruction)   Subjective:  Feels better - abdominal pain better.  He had 2 BM's early this AM.  Objective:   Vitals:   07/04/16 2131 07/05/16 0557  BP: 139/68 (!) 150/75  Pulse: 66 66  Resp: 18 17  Temp: 98.5 F (36.9 C) 98.2 F (36.8 C)     Intake/Output from previous day:  04/07 0701 - 04/08 0700 In: 550.4 [P.O.:40; I.V.:510.4] Out: 300 [Emesis/NG output:300]  Intake/Output this shift:  No intake/output data recorded.   Physical Exam:   General: WN older WM who is alert and oriented.    HEENT: Normal. Pupils equal. .   Lungs: Clear.   Abdomen: Soft.  Has BS.   Lab Results:    Recent Labs  07/04/16 0331 07/05/16 0431  WBC 9.1 6.8  HGB 15.7 12.4*  HCT 43.9 37.0*  PLT 175 161    BMET   Recent Labs  07/04/16 0331 07/05/16 0431  NA 134* 141  K 3.6 4.0  CL 99* 112*  CO2 24 25  GLUCOSE 192* 128*  BUN 22* 16  CREATININE 1.77* 1.51*  CALCIUM 9.9 8.3*    PT/INR  No results for input(s): LABPROT, INR in the last 72 hours.  ABG  No results for input(s): PHART, HCO3 in the last 72 hours.  Invalid input(s): PCO2, PO2   Studies/Results:  Ct Abdomen Pelvis Wo Contrast  Result Date: 07/04/2016 CLINICAL DATA:  Epigastric pain and distention, onset today. EXAM: CT ABDOMEN AND PELVIS WITHOUT CONTRAST TECHNIQUE: Multidetector CT imaging of the abdomen and pelvis was performed following the standard protocol without IV  contrast. COMPARISON:  09/19/2015 FINDINGS: Lower chest: Mild dependent lung base opacities bilaterally may be atelectatic. No suspicious nodules. Hepatobiliary: Multiple calculi in the gallbladder lumen measuring up to 5 mm. No bile duct dilatation. No focal liver lesions on unenhanced scanning. Pancreas: Unremarkable. No pancreatic ductal dilatation or surrounding inflammatory changes. Spleen: Normal in size without focal abnormality. Adrenals/Urinary Tract: Unremarkable appearances of the left nephrectomy bed. Right kidney appears normal. No urinary calculi. Ureter and urinary bladder are unremarkable. Mild thickening of the left adrenal. Normal right adrenal. Stomach/Bowel: Abnormal dilatation and stacking of small bowel loops with abrupt transition to decompressed ileum. At the transition, no mass or hernia is evident. The transition is visible on coronal series 5, image 53 and axial series 2, image 49. Colon is decompressed. Stomach is moderately distended. Vascular/Lymphatic: The abdominal aorta is normal in caliber with moderate atherosclerotic calcification. No adenopathy is evident in the abdomen or pelvis. Reproductive: Unremarkable Other: No ascites.  No extraluminal air. Musculoskeletal: No significant skeletal lesion IMPRESSION: 1. Small bowel obstruction, probably due to adhesions. Transition point is located in the right mid abdomen. 2. Cholelithiasis 3. Aortic atherosclerosis 4. Unremarkable appearances of the left nephrectomy bed. Electronically Signed   By: Quillian Quince  Armandina Stammer M.D.   On: 07/04/2016 04:54   Dg Abd Portable 1v-small Bowel Obstruction Protocol-initial, 8 Hr Delay  Result Date: 07/04/2016 CLINICAL DATA:  Small bowel obstruction. EXAM: PORTABLE ABDOMEN - 1 VIEW COMPARISON:  Prior today FINDINGS: Nasogastric tube is again seen within mid stomach. Multiple dilated small bowel loops again seen. For contrast is seen within dilated small bowel loops as well as nondilated ascending and  transverse colon. These findings are most consistent with a partial small bowel obstruction. IMPRESSION: Oral contrast seen in multiple dilated small bowel loops as well as nondilated ascending and transverse colon, consistent with partial small bowel obstruction. Electronically Signed   By: Earle Gell M.D.   On: 07/04/2016 18:43   Dg Abd Portable 1v  Result Date: 07/04/2016 CLINICAL DATA:  NG tube placement EXAM: PORTABLE ABDOMEN - 1 VIEW COMPARISON:  None. FINDINGS: The nasogastric tube extends well into the stomach. Dilated stacked loops of small bowel are again evident, consistent with obstruction. Multiple gallbladder calculi are visible. IMPRESSION: Nasogastric tube extends well into the stomach. Cholelithiasis. Dilated small bowel consistent with obstruction. Electronically Signed   By: Andreas Newport M.D.   On: 07/04/2016 06:33     Anti-infectives:   Anti-infectives    None      Alphonsa Overall, MD, FACS Pager: [phone] Surgery Office: [phone] 07/05/2016

## 2016-07-06 NOTE — Discharge Instructions (Signed)
Small Bowel Obstruction A small bowel obstruction means that something is blocking the small bowel. The small bowel is also called the small intestine. It is the long tube that connects the stomach to the colon. An obstruction will stop food and fluids from passing through the small bowel. Treatment depends on what is causing the problem and how bad the problem is. Follow these instructions at home:  Get a lot of rest.  Follow your diet as told by your doctor. You may need to:  Only drink clear liquids until you start to get better.  Avoid solid foods as told by your doctor.  Take over-the-counter and prescription medicines only as told by your doctor.  Keep all follow-up visits as told by your doctor. This is important. Contact a doctor if:  You have a fever.  You have chills. Get help right away if:  You have pain or cramps that get worse.  You throw up (vomit) blood.  You have a feeling of being sick to your stomach (nausea) that does not go away.  You cannot stop throwing up.  You cannot drink fluids.  You feel confused.  You feel dry or thirsty (dehydrated).  Your belly gets more bloated.  You feel weak or you pass out (faint). This information is not intended to replace advice given to you by your health care provider. Make sure you discuss any questions you have with your health care provider. Document Released: 04/23/2004 Document Revised: 11/11/2015 Document Reviewed: 05/10/2014 Elsevier Interactive Patient Education  2017 Elsevier Inc.  Low-Fiber Diet Fiber is found in fruits, vegetables, and whole grains. A low-fiber diet restricts fibrous foods that are not digested in the small intestine. A diet containing about 10-15 grams of fiber per day is considered low fiber. Low-fiber diets may be used to:  Promote healing and rest the bowel during intestinal flare-ups.  Prevent blockage of a partially obstructed or narrowed gastrointestinal tract.  Reduce fecal  weight and volume.  Slow the movement of feces. You may be on a low-fiber diet as a transitional diet following surgery, after an injury (trauma), or because of a short (acute) or lifelong (chronic) illness. Your health care provider will determine the length of time you need to stay on this diet. What do I need to know about a low-fiber diet? Always check the fiber content on the packaging's Nutrition Facts label, especially on foods from the grains list. Ask your dietitian if you have questions about specific foods that are related to your condition, especially if the food is not listed below. In general, a low-fiber food will have less than 2 g of fiber. What foods can I eat? Grains  All breads and crackers made with white flour. Sweet rolls, doughnuts, waffles, pancakes, [place] toast, bagels. Pretzels, Melba toast, zwieback. Well-cooked cereals, such as cornmeal, farina, or cream cereals. Dry cereals that do not contain whole grains, fruit, or nuts, such as refined corn, wheat, rice, and oat cereals. Potatoes prepared any way without skins, plain pastas and noodles, refined white rice. Use white flour for baking and making sauces. Use allowed list of grains for casseroles, dumplings, and puddings. Vegetables  Strained tomato and vegetable juices. Fresh lettuce, cucumber, spinach. Well-cooked (no skin or pulp) or canned vegetables, such as asparagus, bean sprouts, beets, carrots, green beans, mushrooms, potatoes, pumpkin, spinach, yellow squash, tomato sauce/puree, turnips, yams, and zucchini. Keep servings limited to  cup. Fruits  All fruit juices except prune juice. Cooked or canned fruits without  skin and seeds, such as applesauce, apricots, cherries, fruit cocktail, grapefruit, grapes, mandarin oranges, melons, peaches, pears, pineapple, and plums. Fresh fruits without skin, such as apricots, avocados, bananas, melons, pineapple, nectarines, and peaches. Keep servings limited to  cup or 1  piece. Meat and Other Protein Sources  Ground or well-cooked tender beef, ham, veal, lamb, pork, or poultry. Eggs, plain cheese. Fish, oysters, shrimp, lobster, and other seafood. Liver, organ meats. Smooth nut butters. Dairy  All milk products and alternative dairy substitutes, such as soy, rice, almond, and coconut, not containing added whole nuts, seeds, or added fruit. Beverages  Decaf coffee, fruit, and vegetable juices or smoothies (small amounts, with no pulp or skins, and with fruits from allowed list), sports drinks, herbal tea. Condiments  Ketchup, mustard, vinegar, cream sauce, cheese sauce, cocoa powder. Spices in moderation, such as allspice, basil, bay leaves, celery powder or leaves, cinnamon, cumin powder, curry powder, ginger, mace, marjoram, onion or garlic powder, oregano, paprika, parsley flakes, ground pepper, rosemary, sage, savory, tarragon, thyme, and turmeric. Sweets and Desserts  Plain cakes and cookies, pie made with allowed fruit, pudding, custard, cream pie. Gelatin, fruit, ice, sherbet, frozen ice pops. Ice cream, ice milk without nuts. Plain hard candy, honey, jelly, molasses, syrup, sugar, chocolate syrup, gumdrops, marshmallows. Limit overall sugar intake. Fats and Oil  Margarine, butter, cream, mayonnaise, salad oils, plain salad dressings made from allowed foods. Choose healthy fats such as olive oil, canola oil, and omega-3 fatty acids (such as found in salmon or tuna) when possible. Other  Bouillon, broth, or cream soups made from allowed foods. Any strained soup. Casseroles or mixed dishes made with allowed foods. The items listed above may not be a complete list of recommended foods or beverages. Contact your dietitian for more options.  What foods are not recommended? Grains  All whole wheat and whole grain breads and crackers. Multigrains, rye, bran seeds, nuts, or coconut. Cereals containing whole grains, multigrains, bran, coconut, nuts, raisins. Cooked or  dry oatmeal, steel-cut oats. Coarse wheat cereals, granola. Cereals advertised as high fiber. Potato skins. Whole grain pasta, wild or brown rice. Popcorn. Coconut flour. Bran, buckwheat, corn bread, multigrains, rye, wheat germ. Vegetables  Fresh, cooked or canned vegetables, such as artichokes, asparagus, beet greens, broccoli, Brussels sprouts, cabbage, celery, cauliflower, corn, eggplant, kale, legumes or beans, okra, peas, and tomatoes. Avoid large servings of any vegetables, especially raw vegetables. Fruits  Fresh fruits, such as apples with or without skin, berries, cherries, figs, grapes, grapefruit, guavas, kiwis, mangoes, oranges, papayas, pears, persimmons, pineapple, and pomegranate. Prune juice and juices with pulp, stewed or dried prunes. Dried fruits, dates, raisins. Fruit seeds or skins. Avoid large servings of all fresh fruits. Meats and Other Protein Sources  Tough, fibrous meats with gristle. Chunky nut butter. Cheese made with seeds, nuts, or other foods not recommended. Nuts, seeds, legumes (beans, including baked beans), dried peas, beans, lentils. Dairy  Yogurt or cheese that contains nuts, seeds, or added fruit. Beverages  Fruit juices with high pulp, prune juice. Caffeinated coffee and teas. Condiments  Coconut, maple syrup, pickles, olives. Sweets and Desserts  Desserts, cookies, or candies that contain nuts or coconut, chunky peanut butter, dried fruits. Jams, preserves with seeds, marmalade. Large amounts of sugar and sweets. Any other dessert made with fruits from the not recommended list. Other  Soups made from vegetables that are not recommended or that contain other foods not recommended. The items listed above may not be a complete list of foods  and beverages to avoid. Contact your dietitian for more information.  This information is not intended to replace advice given to you by your health care provider. Make sure you discuss any questions you have with your  health care provider. Document Released: 09/05/2001 Document Revised: 08/22/2015 Document Reviewed: 02/06/2013 Elsevier Interactive Patient Education  2017 [employer].

## 2016-07-06 NOTE — Progress Notes (Signed)
[facility] Progress Note     Subjective: CC: SBO. Patient requesting to go home. Having BMs. Denies abdominal pain. Tolerating clears. Denies nausea/vomiting. Ambulating. Feels like his abdomen is soft and back to baseline.  Objective: Vital signs in last 24 hours: Temp:  [98.3 F (36.8 C)-98.5 F (36.9 C)] 98.5 F (36.9 C) (04/09 0450) Pulse Rate:  [52-65] 52 (04/09 0450) Resp:  [18] 18 (04/09 0450) BP: (133-153)/(60-83) 133/60 (04/09 0450) SpO2:  [95 %-97 %] 95 % (04/09 0450) Last BM Date: 07/05/16  Intake/Output from previous day: 04/08 0701 - 04/09 0700 In: 1480 [P.O.:1080; I.V.:400] Out: -  Intake/Output this shift: No intake/output data recorded.  PE: Gen:  Alert, NAD, pleasant Card:  Regular rate and rhythm, pedal pulses 2+ Pulm:  Normal respiratory effort, clear to auscultation bilaterally Abd: Soft, non-tender, non-distended, bowel sounds present in all 4 quadrants Ext:  No erythema, edema, or tenderness Neuro: alert and oriented x 3   Lab Results:   Recent Labs  07/04/16 0331 07/05/16 0431  WBC 9.1 6.8  HGB 15.7 12.4*  HCT 43.9 37.0*  PLT 175 161   BMET  Recent Labs  07/04/16 0331 07/05/16 0431  NA 134* 141  K 3.6 4.0  CL 99* 112*  CO2 24 25  GLUCOSE 192* 128*  BUN 22* 16  CREATININE 1.77* 1.51*  CALCIUM 9.9 8.3*   PT/INR No results for input(s): LABPROT, INR in the last 72 hours. CMP     Component Value Date/Time   NA 141 07/05/2016 0431   K 4.0 07/05/2016 0431   CL 112 (H) 07/05/2016 0431   CO2 25 07/05/2016 0431   GLUCOSE 128 (H) 07/05/2016 0431   BUN 16 07/05/2016 0431   CREATININE 1.51 (H) 07/05/2016 0431   CALCIUM 8.3 (L) 07/05/2016 0431   PROT 7.8 07/04/2016 0331   ALBUMIN 4.4 07/04/2016 0331   AST 29 07/04/2016 0331   ALT 29 07/04/2016 0331   ALKPHOS 78 07/04/2016 0331   BILITOT 0.8 07/04/2016 0331   GFRNONAA 43 (L) 07/05/2016 0431   GFRAA 50 (L) 07/05/2016 0431   Lipase     Component Value Date/Time    LIPASE 24 07/04/2016 0331       Studies/Results: Dg Abd Portable 1v-small Bowel Obstruction Protocol-initial, 8 Hr Delay  Result Date: 07/04/2016 CLINICAL DATA:  Small bowel obstruction. EXAM: PORTABLE ABDOMEN - 1 VIEW COMPARISON:  Prior today FINDINGS: Nasogastric tube is again seen within mid stomach. Multiple dilated small bowel loops again seen. For contrast is seen within dilated small bowel loops as well as nondilated ascending and transverse colon. These findings are most consistent with a partial small bowel obstruction. IMPRESSION: Oral contrast seen in multiple dilated small bowel loops as well as nondilated ascending and transverse colon, consistent with partial small bowel obstruction. Electronically Signed   By: Earle Gell M.D.   On: 07/04/2016 18:43    Anti-infectives: Anti-infectives    None     Assessment/Plan 1.  SBO             Better on KUB with contrast and air in colon             WBC - 6,800 - 07/05/2016             Improved - tolerating clear liquids, advance diet to SOFT  Discharge today if tolerates a soft diet   2.  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 Tresa Moore 3.  HTN 4.  Creatinine - better             Creat - 1.51 - 07/05/2016 5.  DVT prophylaxis - Lovenox   LOS: 2 days    Jill Alexanders , [facility] 07/06/2016, 7:33 AM Pager: [phone] Consults: [phone] Mon-Fri 7:00 am-4:30 pm Sat-Sun 7:00 am-11:30 am

## 2016-07-06 NOTE — Progress Notes (Signed)
Pt was discharged home today. Instructions were reviewed with patient, and questions were answered. Pt was taken to main entrance via wheelchair by NT.  

## 2016-07-16 NOTE — Discharge Summary (Signed)
[facility] Discharge Summary   Patient ID: ASAAD GULLEY MRN: [id number] DOB/AGE: 06-17-1940 76 y.o.  Admit date: 07/04/2016 Discharge date: 07/06/2016  Discharge Diagnosis Patient Active Problem List   Diagnosis Date Noted  . SBO (small bowel obstruction) ([place]) 07/04/2016  . Hyponatremia 06/18/2014  . Renal mass 06/15/2014   Consultants none Imaging: CT ABD PELV W/O CONTRAST 07/04/16 -  1. Small bowel obstruction, probably due to adhesions. Transition point is located in the right mid abdomen. 2. Cholelithiasis 3. Aortic atherosclerosis 4. Unremarkable appearances of the left nephrectomy bed.  DG ABD 07/04/16 - Nasogastric tube extends well into the stomach. Cholelithiasis. Dilated small bowel consistent with obstruction.  DG ABD 07/04/16 -  IMPRESSION: Oral contrast seen in multiple dilated small bowel loops as well as nondilated ascending and transverse colon, consistent with partial small bowel obstruction.  Procedures none  Hospital Course:  76 y.o. Male s/p L nephrectomy 05/2014 for kidney mass and h/o appendectomy who presented to [facility] with 24 hours of abdominal pain associated with nausea/vomiting. Workup suggestive of pSBO. Nasogastric tube was placed and patient admitted for further management. Bowel function returned without surgical management and on 07/06/16 the patient was tolerating a diet, having bowel movements, and clinically stable for discharge. He will follow up as needed.  Allergies as of 07/06/2016   No Known Allergies     Medication List    TAKE these medications   amLODipine 10 MG tablet Commonly known as:  NORVASC Take 10 mg by mouth every evening.   CENTRUM SILVER ADULT 50+ PO Take 1 tablet by mouth daily.   simvastatin 20 MG tablet Commonly known as:  ZOCOR Take 20 mg by mouth at bedtime.   terazosin 2 MG capsule Commonly known as:  HYTRIN Take 2 mg by mouth 2 (two) times daily.        Follow-up [facility], [place]. Call.   Specialty:  General Surgery Why:  as needed. Contact information: [address] [place] [phone]          Signed: Obie Dredge, [facility] 07/16/2016, 4:00 PM Pager: [phone] Consults: [phone] Mon-Fri 7:00 am-4:30 pm Sat-Sun 7:00 am-11:30 am

## 2016-09-14 DIAGNOSIS — R7303 Prediabetes: Secondary | ICD-10-CM | POA: Diagnosis not present

## 2016-09-14 DIAGNOSIS — R748 Abnormal levels of other serum enzymes: Secondary | ICD-10-CM | POA: Diagnosis not present

## 2016-09-14 DIAGNOSIS — K8020 Calculus of gallbladder without cholecystitis without obstruction: Secondary | ICD-10-CM | POA: Diagnosis not present

## 2016-09-14 DIAGNOSIS — C649 Malignant neoplasm of unspecified kidney, except renal pelvis: Secondary | ICD-10-CM | POA: Diagnosis not present

## 2016-09-14 DIAGNOSIS — N400 Enlarged prostate without lower urinary tract symptoms: Secondary | ICD-10-CM | POA: Diagnosis not present

## 2016-09-14 DIAGNOSIS — R209 Unspecified disturbances of skin sensation: Secondary | ICD-10-CM | POA: Diagnosis not present

## 2016-09-14 DIAGNOSIS — Z0000 Encounter for general adult medical examination without abnormal findings: Secondary | ICD-10-CM | POA: Diagnosis not present

## 2016-09-14 DIAGNOSIS — E871 Hypo-osmolality and hyponatremia: Secondary | ICD-10-CM | POA: Diagnosis not present

## 2016-09-14 DIAGNOSIS — E785 Hyperlipidemia, unspecified: Secondary | ICD-10-CM | POA: Diagnosis not present

## 2016-09-14 DIAGNOSIS — I10 Essential (primary) hypertension: Secondary | ICD-10-CM | POA: Diagnosis not present

## 2016-09-29 DIAGNOSIS — N281 Cyst of kidney, acquired: Secondary | ICD-10-CM | POA: Diagnosis not present

## 2016-09-29 DIAGNOSIS — C642 Malignant neoplasm of left kidney, except renal pelvis: Secondary | ICD-10-CM | POA: Diagnosis not present

## 2016-10-27 ENCOUNTER — Encounter: Payer: Self-pay | Admitting: Neurology

## 2016-10-27 ENCOUNTER — Ambulatory Visit (INDEPENDENT_AMBULATORY_CARE_PROVIDER_SITE_OTHER): Payer: Medicare Other | Admitting: Neurology

## 2016-10-27 VITALS — BP 174/79 | HR 77 | Ht 69.0 in | Wt 162.0 lb

## 2016-10-27 DIAGNOSIS — G5600 Carpal tunnel syndrome, unspecified upper limb: Secondary | ICD-10-CM | POA: Insufficient documentation

## 2016-10-27 DIAGNOSIS — G5603 Carpal tunnel syndrome, bilateral upper limbs: Secondary | ICD-10-CM

## 2016-10-27 NOTE — Progress Notes (Signed)
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 male here as a referral from Dr. Orland Penman for paresthesias/numbness. Past medical history hypertension and hyperlipidemia, hyperglycemia, paresthesias or numbness, malignant tumor kidney s/p resection.  he is a recovered alcoholic with alcoholic hepatitis with a history of elevated LFTs. The symptoms started mid may of this year. At that time his dosage for terazosin was doubled. He has a little tingle and electrical shock mostly in the hands but sometimes in the feet. Very mild. Not painful, or affecting life. He is pre-diabetic. Not worsening. Started in the fingertips. Doesn;t happen in the toes but sometimes it happens in the arch and he has other foot problems and no tin the toes. He uses a mouse a long time. Symptoms more in the right hand. No neck pain or shooting pains into the arms.  He feels the tingling when he is driving. He feels it then. And when he wakes up he feels numbness in the hands. No symptoms in the feet. No weakness. No other focal neurologic deficits, associated symptoms, inciting events or modifiable factors.  Reviewed notes, labs and imaging from outside physicians, which showed:   CBC with anemia hemoglobin 12.4, BMP with elevated glucose 128, elevated creatinine 1.51 labs from April 2018  B12 was drawn, TSH and hemoglobin A1c primary care physician.   Review of Systems: Patient complains of symptoms per HPI as well as the following symptoms: Easy bruising, ringing in ears. Pertinent negatives and positives per HPI. All others negative.   Social History   Social History  . Marital status: Divorced    Spouse name: N/A  . Number of children: N/A  . Years of education: N/A   Occupational History  . Not on file.   Social History Main Topics  . Smoking  status: Never Smoker  . Smokeless tobacco: Never Used  . Alcohol use No     Comment: No Alcohol x 20 yrs "recovering alcoholic"  . Drug use: No  . Sexual activity: Not on file   Other Topics Concern  . Not on file   Social History Narrative  . No narrative on file    Family History  Problem Relation Age of Onset  . Neuropathy Neg Hx     Past Medical History:  Diagnosis Date  . BPH (benign prostatic hypertrophy)   . Hyperlipidemia   . Hypertension   . Renal mass, left   . Seizures ([place])    20 YRS AGO due to ETOH abuse - no Alcohol x 20 yrs    Past Surgical History:  Procedure Laterality Date  . APPENDECTOMY  1974  . CATARACTS REMOVED  2014  . HERNIA REPAIR  2002  . INGUINAL HERNIA REPAIR Right 06/15/2014   Procedure: HERNIA REPAIR INGUINAL ADULT;  Surgeon: Coralie Keens, MD;  Location: [facility];  Service: General;  Laterality: Right;  . INSERTION OF MESH Right 06/15/2014   Procedure: INSERTION OF MESH;  Surgeon: Coralie Keens, MD;  Location: [facility];  Service: General;  Laterality: Right;  . ROBOT ASSISTED LAPAROSCOPIC NEPHRECTOMY Left 06/15/2014   Procedure: ROBOTIC ASSISTED LAPAROSCOPIC NEPHRECTOMY;  Surgeon: Alexis Frock, MD;  Location: [facility];  Service: Urology;  Laterality: Left;  . TONSILLECTOMY      Current Outpatient Prescriptions  Medication Sig Dispense Refill  . amLODipine (  NORVASC) 10 MG tablet Take 10 mg by mouth every evening.    . Multiple Vitamins-Minerals (CENTRUM SILVER ADULT 50+ PO) Take 1 tablet by mouth daily.    . simvastatin (ZOCOR) 20 MG tablet Take 20 mg by mouth at bedtime.    Marland Kitchen terazosin (HYTRIN) 2 MG capsule Take 2 mg by mouth 2 (two) times daily.     No current facility-administered medications for this visit.     Allergies as of 10/27/2016  . (No Known Allergies)    Vitals: BP (!) 174/79   Pulse 77   Ht 5\' 9"  (1.753 m)   Wt 162 lb (73.5 kg)   BMI 23.92 kg/m  Last Weight:  Wt Readings from Last 1 Encounters:  10/27/16 162  lb (73.5 kg)   Last Height:   Ht Readings from Last 1 Encounters:  10/27/16 5\' 9"  (1.753 m)   Physical exam: Exam: Gen: NAD, conversant, well nourised, obese, well groomed                     CV: RRR, no MRG. No Carotid Bruits. No peripheral edema, warm, nontender Eyes: Conjunctivae clear without exudates or hemorrhage  Neuro: Detailed Neurologic Exam  Speech:    Speech is normal; fluent and spontaneous with normal comprehension.  Cognition:    The patient is oriented to person, place, and time;     recent and remote memory intact;     language fluent;     normal attention, concentration,     fund of knowledge Cranial Nerves:    The pupils are equal, round, and reactive to light. Attempted fundoscopic exam, could not visualize fundi, attempted.  Visual fields are full to finger confrontation. Extraocular movements are intact. Trigeminal sensation is intact and the muscles of mastication are normal. The face is symmetric. The palate elevates in the midline. Hearing intact. Voice is normal. Shoulder shrug is normal. The tongue has normal motion without fasciculations.   Coordination:    Normal finger to nose and heel to shin. Normal rapid alternating movements.   Gait:    Heel-toe and tandem gait are normal.   Motor Observation:    No asymmetry, no atrophy, and no involuntary movements noted. Tone:    Normal muscle tone.    Posture:    Posture is normal. normal erect    Strength:    Strength is V/V in the upper and lower limbs.      Sensation: intact to LT and pin prick distally     Reflex Exam:  DTR's:    Absent AJs otherwise deep tendon reflexes in the upper and lower extremities are normal bilaterally.   Toes:    The toes are downgoing bilaterally.   Clonus:    Clonus is absent.  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 No symptoms in the toes and sensory exam is normal distally. Discussed, will try  bracing for a few months and if symptoms do not improve he will come back for emg/ncs. Will request B12, hgba1c and tsh results from primary care.   Cc: Dr. Tye Maryland, MD  [facility] [address] [place], [place] [address]  Phone [phone] Fax [phone]

## 2016-10-27 NOTE — Patient Instructions (Signed)
Carpal Tunnel Syndrome Carpal tunnel syndrome is a condition that causes pain in your hand and arm. The carpal tunnel is a narrow area located on the palm side of your wrist. Repeated wrist motion or certain diseases may cause swelling within the tunnel. This swelling pinches the main nerve in the wrist (median nerve). What are the causes? This condition may be caused by:  Repeated wrist motions.  Wrist injuries.  Arthritis.  A cyst or tumor in the carpal tunnel.  Fluid buildup during pregnancy.  Sometimes the cause of this condition is not known. What increases the risk? This condition is more likely to develop in:  People who have jobs that cause them to repeatedly move their wrists in the same motion, such as butchers and cashiers.  Women.  People with certain conditions, such as: ? Diabetes. ? Obesity. ? An underactive thyroid (hypothyroidism). ? Kidney failure.  What are the signs or symptoms? Symptoms of this condition include:  A tingling feeling in your fingers, especially in your thumb, index, and middle fingers.  Tingling or numbness in your hand.  An aching feeling in your entire arm, especially when your wrist and elbow are bent for long periods of time.  Wrist pain that goes up your arm to your shoulder.  Pain that goes down into your palm or fingers.  A weak feeling in your hands. You may have trouble grabbing and holding items.  Your symptoms may feel worse during the night. How is this diagnosed? This condition is diagnosed with a medical history and physical exam. You may also have tests, including:  An electromyogram (EMG). This test measures electrical signals sent by your nerves into the muscles.  X-rays.  How is this treated? Treatment for this condition includes:  Lifestyle changes. It is important to stop doing or modify the activity that caused your condition.  Physical or occupational therapy.  Medicines for pain and inflammation.  This may include medicine that is injected into your wrist.  A wrist splint.  Surgery.  Follow these instructions at home: If you have a splint:  Wear it as told by your health care provider. Remove it only as told by your health care provider.  Loosen the splint if your fingers become numb and tingle, or if they turn cold and blue.  Keep the splint clean and dry. General instructions  Take over-the-counter and prescription medicines only as told by your health care provider.  Rest your wrist from any activity that may be causing your pain. If your condition is work related, talk to your employer about changes that can be made, such as getting a wrist pad to use while typing.  If directed, apply ice to the painful area: ? Put ice in a plastic bag. ? Place a towel between your skin and the bag. ? Leave the ice on for 20 minutes, 2-3 times per day.  Keep all follow-up visits as told by your health care provider. This is important.  Do any exercises as told by your health care provider, physical therapist, or occupational therapist. Contact a health care provider if:  You have new symptoms.  Your pain is not controlled with medicines.  Your symptoms get worse. This information is not intended to replace advice given to you by your health care provider. Make sure you discuss any questions you have with your health care provider. Document Released: 03/13/2000 Document Revised: 07/25/2015 Document Reviewed: 08/01/2014 Elsevier Interactive Patient Education  2017 [employer].  

## 2016-12-14 DIAGNOSIS — Z23 Encounter for immunization: Secondary | ICD-10-CM | POA: Diagnosis not present

## 2017-03-16 DIAGNOSIS — G5601 Carpal tunnel syndrome, right upper limb: Secondary | ICD-10-CM | POA: Diagnosis not present

## 2017-03-16 DIAGNOSIS — R7303 Prediabetes: Secondary | ICD-10-CM | POA: Diagnosis not present

## 2017-03-16 DIAGNOSIS — E785 Hyperlipidemia, unspecified: Secondary | ICD-10-CM | POA: Diagnosis not present

## 2017-03-16 DIAGNOSIS — R748 Abnormal levels of other serum enzymes: Secondary | ICD-10-CM | POA: Diagnosis not present

## 2017-03-16 DIAGNOSIS — I10 Essential (primary) hypertension: Secondary | ICD-10-CM | POA: Diagnosis not present

## 2017-03-16 DIAGNOSIS — R209 Unspecified disturbances of skin sensation: Secondary | ICD-10-CM | POA: Diagnosis not present

## 2017-03-16 DIAGNOSIS — C649 Malignant neoplasm of unspecified kidney, except renal pelvis: Secondary | ICD-10-CM | POA: Diagnosis not present

## 2017-03-16 DIAGNOSIS — N400 Enlarged prostate without lower urinary tract symptoms: Secondary | ICD-10-CM | POA: Diagnosis not present

## 2017-06-07 DIAGNOSIS — B078 Other viral warts: Secondary | ICD-10-CM | POA: Diagnosis not present

## 2017-06-07 DIAGNOSIS — C4441 Basal cell carcinoma of skin of scalp and neck: Secondary | ICD-10-CM | POA: Diagnosis not present

## 2017-06-30 DIAGNOSIS — Z08 Encounter for follow-up examination after completed treatment for malignant neoplasm: Secondary | ICD-10-CM | POA: Diagnosis not present

## 2017-06-30 DIAGNOSIS — Z85828 Personal history of other malignant neoplasm of skin: Secondary | ICD-10-CM | POA: Diagnosis not present

## 2017-07-19 ENCOUNTER — Encounter (HOSPITAL_COMMUNITY): Payer: Self-pay | Admitting: Emergency Medicine

## 2017-07-19 ENCOUNTER — Emergency Department (HOSPITAL_COMMUNITY): Payer: Medicare Other

## 2017-07-19 ENCOUNTER — Emergency Department (HOSPITAL_COMMUNITY)
Admission: EM | Admit: 2017-07-19 | Discharge: 2017-07-19 | Disposition: A | Discharge status: Home / Self Care | Payer: Medicare Other | Attending: Emergency Medicine | Admitting: Emergency Medicine

## 2017-07-19 DIAGNOSIS — M2669 Other specified disorders of temporomandibular joint: Secondary | ICD-10-CM | POA: Diagnosis not present

## 2017-07-19 DIAGNOSIS — Z85528 Personal history of other malignant neoplasm of kidney: Secondary | ICD-10-CM | POA: Diagnosis not present

## 2017-07-19 DIAGNOSIS — Z79899 Other long term (current) drug therapy: Secondary | ICD-10-CM | POA: Insufficient documentation

## 2017-07-19 DIAGNOSIS — M2612 Other jaw asymmetry: Secondary | ICD-10-CM

## 2017-07-19 DIAGNOSIS — I10 Essential (primary) hypertension: Secondary | ICD-10-CM | POA: Diagnosis not present

## 2017-07-19 DIAGNOSIS — M26621 Arthralgia of right temporomandibular joint: Secondary | ICD-10-CM | POA: Diagnosis not present

## 2017-07-19 DIAGNOSIS — R6884 Jaw pain: Secondary | ICD-10-CM | POA: Diagnosis not present

## 2017-07-19 NOTE — Discharge Instructions (Signed)
Thank you for allowing me to care for you in the Emergency Department today.   Your CT showed arthritis on the right side of your jaw, but it is not dislocated. The symptoms associated with this may resolve on their own, but in the meantime you can treat it by:   - Taking 650 mg of Tylenol every 6 hours for pain control.   - You can also place ice separated between your skin by a towel over the area for 15 to 20 minutes 3-4 times a day to help with pain.  - Avoid chewing gum or grinding your teeth. If you grind your teeth, you can sleep with a mouthguard to prevent doing this in your sleep.   If your symptoms do not start to improve over the next week, please follow-up with your primary care provider.  If you develop any new or worsening symptoms including fever, chills, if the right side of the jaw becomes swollen, hot to the touch, or red, or if you develop any other new concerning symptoms, please return to the emergency department for reevaluation.

## 2017-07-19 NOTE — ED Provider Notes (Signed)
[facility] DEPT Provider Note   CSN: [id number] Arrival date & time: 07/19/17  1206     History   Chief Complaint Chief Complaint  Patient presents with  . Jaw Pain    HPI Taylor Dillon is a 77 y.o. 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  Pain is worse with chewing. No known alleviating factors. He states that he has been unable to clench his jaw.  He states that he do not feel as of his back teeth are aligned and he has not been able to bite down on his back teeth. He has been chewing on the left side of his mouth.  States that he googled his symptoms and is concerned that he dislocated his jaw.  No history of similar.  He denies any trauma or injury to the face or jaw.  He also endorses mild right-sided otalgia.  No fevers, chills, sore throat, headache, neck pain, eye pain, or dizziness.  He states that he attempted to call his dentist earlier today for follow-up, but their office was closed.  He also contacted his PCP did not have any availability today.  No treatment prior to arrival.  The history is provided by the patient. No language interpreter was used.    Past Medical History:  Diagnosis Date  . BPH (benign prostatic hypertrophy)   . Hyperlipidemia   . Hypertension   . Renal mass, left   . Seizures ([place])    20 YRS AGO due to ETOH abuse - no Alcohol x 20 yrs    Patient Active Problem List   Diagnosis Date Noted  . CTS (carpal tunnel syndrome) 10/27/2016  . SBO (small bowel obstruction) ([place]) 07/04/2016  . Hyponatremia 06/18/2014  . Renal mass 06/15/2014    Past Surgical History:  Procedure Laterality Date  . APPENDECTOMY  1974  . CATARACTS REMOVED  2014  . HERNIA REPAIR   2002  . INGUINAL HERNIA REPAIR Right 06/15/2014   Procedure: HERNIA REPAIR INGUINAL ADULT;  Surgeon: Coralie Keens, MD;  Location: [facility];  Service: General;  Laterality: Right;  . INSERTION OF MESH Right 06/15/2014   Procedure: INSERTION OF MESH;  Surgeon: Coralie Keens, MD;  Location: [facility];  Service: General;  Laterality: Right;  . ROBOT ASSISTED LAPAROSCOPIC NEPHRECTOMY Left 06/15/2014   Procedure: ROBOTIC ASSISTED LAPAROSCOPIC NEPHRECTOMY;  Surgeon: Alexis Frock, MD;  Location: [facility];  Service: Urology;  Laterality: Left;  . TONSILLECTOMY          Home Medications    Prior to Admission medications   Medication Sig Start Date End Date Taking? Authorizing Provider  amLODipine (NORVASC) 10 MG tablet Take 10 mg by mouth every evening. 06/24/16  Yes [provider]  Multiple Vitamins-Minerals (CENTRUM SILVER ADULT 50+ PO) Take 1 tablet by mouth daily.   Yes [provider]  simvastatin (ZOCOR) 20 MG tablet Take 20 mg by mouth at bedtime.   Yes [provider]  terazosin (HYTRIN) 2 MG capsule Take 2 mg by mouth 2 (two) times daily.   Yes [provider]    Family History Family History  Problem Relation Age of Onset  . Neuropathy Neg Hx     Social History Social History  Tobacco Use  . Smoking status: Never Smoker  . Smokeless tobacco: Never Used  Substance Use Topics  . 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 Drug use: No     Allergies   Patient has no known allergies.   Review of Systems Review of Systems  Constitutional: Negative for activity change, chills and fever.  HENT: Positive for ear pain. Negative for mouth sores, sinus pressure, sinus pain, sneezing, sore throat and trouble swallowing.        Jaw pain   Respiratory: Negative for shortness of breath.   Cardiovascular: Negative for chest pain.  Gastrointestinal: Negative for abdominal pain, diarrhea, nausea and vomiting.    Musculoskeletal: Negative for back pain.  Skin: Negative for rash.   Physical Exam Updated Vital Signs BP 140/88 (BP Location: Left Arm)   Pulse 74   Temp 97.6 F (36.4 C) (Oral)   Resp 18   SpO2 99%   Physical Exam  Constitutional: He appears well-developed.  HENT:  Head: Normocephalic.  Right Ear: Hearing and tympanic membrane normal.  Left Ear: Hearing and tympanic membrane normal.  Mouth/Throat: Uvula is midline, oropharynx is clear and moist and mucous membranes are normal. No trismus in the jaw. No uvula swelling. No oropharyngeal exudate, posterior oropharyngeal edema, posterior oropharyngeal erythema or tonsillar abscesses. No tonsillar exudate.  Soft, non-occluding cerumen is noted in the right canal.  No mastoid tenderness bilaterally.  Patient has full range of motion of the jaw with opening and closing and cytocide movement.  The head of the mandible does feel slightly more prominent on the right side.  Slight mal-alignment of the patient's bite with the right posterior teeth.   Eyes: Conjunctivae are normal.  Neck: Neck supple.  Cardiovascular: Normal rate, regular rhythm, normal heart sounds and intact distal pulses. Exam reveals no gallop and no friction rub.  No murmur heard. Pulmonary/Chest: Effort normal. No stridor. No respiratory distress. He has no wheezes. He has no rales. He exhibits no tenderness.  Abdominal: Soft. He exhibits no distension.  Neurological: He is alert.  Skin: Skin is warm and dry.  Psychiatric: His behavior is normal.  Nursing note and vitals reviewed.  ED Treatments / Results  Labs (all labs ordered are listed, but only abnormal results are displayed) Labs Reviewed - No data to display  EKG None  Radiology Dg Tmj Open & Close Bilateral  Result Date: 07/19/2017 CLINICAL DATA:  Right jaw pain. EXAM: TEMPOROMANDIBULAR JOINTS COMPARISON:  None. FINDINGS: No evidence of mandibular fracture. Normal osseous mineralization. Extensive  dental work noted. IMPRESSION: No evidence of mandibular fracture. Electronically Signed   By: Fidela Salisbury M.D.   On: 07/19/2017 19:14   Ct Maxillofacial Wo Contrast  Result Date: 07/19/2017 CLINICAL DATA:  Right-sided jaw pain after waking up on Friday. EXAM: CT MAXILLOFACIAL WITHOUT CONTRAST TECHNIQUE: Multidetector CT imaging of the maxillofacial structures was performed. Multiplanar CT image reconstructions were also generated. COMPARISON:  None. FINDINGS: Osseous: The temporomandibular joints are maintained bilaterally without joint dislocation. Osteoarthritic joint space narrowing and spurring with subchondral cystic changes seen about the right TMJ however. No acute maxillofacial fracture. The nasal bones and zygomaticomaxillary complexes appear intact. Orbits: Symmetric appearance of both globes and extra-ocular muscles. No acute abnormality of the optic nerves. Orbital walls and floor appear intact. Bilateral lens replacements. Sinuses: Small mucous retention cyst in the left maxillary sinus measuring up to 9 mm. Mild mucosal thickening is seen of both maxillary  sinuses and ethmoid air cells. The frontal and sphenoid sinuses are nonacute. The included mastoids appear clear. Soft tissues: Negative. Limited intracranial: No significant or unexpected finding. IMPRESSION: 1. Osteoarthritis of the right TMJ.  No joint dislocations. 2. Bilateral maxillary and ethmoid sinus mucosal thickening. Small mucous retention cyst in the left maxillary sinus. 3. No acute osseous abnormality. Electronically Signed   By: Ashley Royalty M.D.   On: 07/19/2017 20:27    Procedures Procedures (including critical care time)  Medications Ordered in ED Medications - No data to display   Initial Impression / Assessment and Plan / ED Course  I have reviewed the triage vital signs and the nursing notes.  Pertinent labs & imaging results that were available during my care of the patient were reviewed by me and  considered in my medical decision making (see chart for details).     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  He is concerned that he dislocated his jaw.  On physical exam, he has full range of motion of the jaw.  Head of the mandible is slightly more prominent on the right side.  Spoke with radiology who recommended TMJ open and close bilaterally for assessment of dislocation. Spoke with Dr. Jetta Lout, radiologist, who noted the right joint space to be more narrow on the right side, but could not definitively say whether or not the child was dislocated on the right from the x-ray.  Recommended CT maxillofacial.  CT mostly facial with osteoarthritis of the right TMJ.  No dislocation.  Discussed these findings with the patient.  The patient and plan was also discussed with Dr. Thurnell Garbe, attending physician.  Will discharge the patient to home with Tylenol for symptomatic and education on temporomandibular dysfunction.  He has good follow-up with his primary care provider.  Doubt osteomyelitis, temporomandibular dislocation, or fracture of the mandible.  He is hemodynamically stable and in no acute distress.  He is safe for discharge home at this time.  Final Clinical Impressions(s) / ED Diagnoses   Final diagnoses:  Jaw asymmetry  Arthralgia of right temporomandibular joint    ED Discharge Orders    None       Kaylan Yates A, PA-C 07/19/17 [place], [place], DO 07/19/17 2352

## 2017-07-19 NOTE — ED Triage Notes (Signed)
Patient c/o right side jaw pain since waking up Friday morning. Reports "I think it is dislocated." States he is unable to bite down. Patient speaking in full sentences.

## 2017-07-19 NOTE — ED Notes (Addendum)
Pt ambulatory to XR

## 2017-09-20 DIAGNOSIS — E785 Hyperlipidemia, unspecified: Secondary | ICD-10-CM | POA: Diagnosis not present

## 2017-09-20 DIAGNOSIS — E871 Hypo-osmolality and hyponatremia: Secondary | ICD-10-CM | POA: Diagnosis not present

## 2017-09-20 DIAGNOSIS — Z0000 Encounter for general adult medical examination without abnormal findings: Secondary | ICD-10-CM | POA: Diagnosis not present

## 2017-09-20 DIAGNOSIS — Z125 Encounter for screening for malignant neoplasm of prostate: Secondary | ICD-10-CM | POA: Diagnosis not present

## 2017-09-20 DIAGNOSIS — N400 Enlarged prostate without lower urinary tract symptoms: Secondary | ICD-10-CM | POA: Diagnosis not present

## 2017-09-20 DIAGNOSIS — R748 Abnormal levels of other serum enzymes: Secondary | ICD-10-CM | POA: Diagnosis not present

## 2017-09-20 DIAGNOSIS — K8020 Calculus of gallbladder without cholecystitis without obstruction: Secondary | ICD-10-CM | POA: Diagnosis not present

## 2017-09-20 DIAGNOSIS — R209 Unspecified disturbances of skin sensation: Secondary | ICD-10-CM | POA: Diagnosis not present

## 2017-09-20 DIAGNOSIS — R7303 Prediabetes: Secondary | ICD-10-CM | POA: Diagnosis not present

## 2017-09-20 DIAGNOSIS — I493 Ventricular premature depolarization: Secondary | ICD-10-CM | POA: Diagnosis not present

## 2017-09-20 DIAGNOSIS — C649 Malignant neoplasm of unspecified kidney, except renal pelvis: Secondary | ICD-10-CM | POA: Diagnosis not present

## 2017-09-20 DIAGNOSIS — I10 Essential (primary) hypertension: Secondary | ICD-10-CM | POA: Diagnosis not present

## 2017-09-24 ENCOUNTER — Ambulatory Visit (HOSPITAL_COMMUNITY)
Admission: RE | Admit: 2017-09-24 | Discharge: 2017-09-24 | Disposition: A | Discharge status: Home / Self Care | Payer: Medicare Other | Source: Ambulatory Visit | Attending: Urology | Admitting: Urology

## 2017-09-24 ENCOUNTER — Other Ambulatory Visit: Payer: Self-pay | Admitting: Urology

## 2017-09-24 DIAGNOSIS — C642 Malignant neoplasm of left kidney, except renal pelvis: Secondary | ICD-10-CM

## 2017-09-24 DIAGNOSIS — Z85528 Personal history of other malignant neoplasm of kidney: Secondary | ICD-10-CM | POA: Diagnosis not present

## 2017-09-24 DIAGNOSIS — N281 Cyst of kidney, acquired: Secondary | ICD-10-CM | POA: Diagnosis not present

## 2017-10-05 DIAGNOSIS — C642 Malignant neoplasm of left kidney, except renal pelvis: Secondary | ICD-10-CM | POA: Diagnosis not present

## 2017-10-05 DIAGNOSIS — N281 Cyst of kidney, acquired: Secondary | ICD-10-CM | POA: Diagnosis not present

## 2017-10-05 DIAGNOSIS — Z905 Acquired absence of kidney: Secondary | ICD-10-CM | POA: Diagnosis not present

## 2017-11-22 DIAGNOSIS — Z23 Encounter for immunization: Secondary | ICD-10-CM | POA: Diagnosis not present

## 2018-01-05 DIAGNOSIS — Z08 Encounter for follow-up examination after completed treatment for malignant neoplasm: Secondary | ICD-10-CM | POA: Diagnosis not present

## 2018-01-05 DIAGNOSIS — D225 Melanocytic nevi of trunk: Secondary | ICD-10-CM | POA: Diagnosis not present

## 2018-01-05 DIAGNOSIS — Z85828 Personal history of other malignant neoplasm of skin: Secondary | ICD-10-CM | POA: Diagnosis not present

## 2018-03-10 DIAGNOSIS — M25562 Pain in left knee: Secondary | ICD-10-CM | POA: Diagnosis not present

## 2018-03-21 DIAGNOSIS — J069 Acute upper respiratory infection, unspecified: Secondary | ICD-10-CM | POA: Diagnosis not present

## 2018-03-28 DIAGNOSIS — R7303 Prediabetes: Secondary | ICD-10-CM | POA: Diagnosis not present

## 2018-03-28 DIAGNOSIS — C649 Malignant neoplasm of unspecified kidney, except renal pelvis: Secondary | ICD-10-CM | POA: Diagnosis not present

## 2018-03-28 DIAGNOSIS — N400 Enlarged prostate without lower urinary tract symptoms: Secondary | ICD-10-CM | POA: Diagnosis not present

## 2018-03-28 DIAGNOSIS — K8020 Calculus of gallbladder without cholecystitis without obstruction: Secondary | ICD-10-CM | POA: Diagnosis not present

## 2018-03-28 DIAGNOSIS — R7989 Other specified abnormal findings of blood chemistry: Secondary | ICD-10-CM | POA: Diagnosis not present

## 2018-03-28 DIAGNOSIS — I10 Essential (primary) hypertension: Secondary | ICD-10-CM | POA: Diagnosis not present

## 2018-03-28 DIAGNOSIS — E871 Hypo-osmolality and hyponatremia: Secondary | ICD-10-CM | POA: Diagnosis not present

## 2018-03-28 DIAGNOSIS — E785 Hyperlipidemia, unspecified: Secondary | ICD-10-CM | POA: Diagnosis not present

## 2018-04-17 DIAGNOSIS — J3489 Other specified disorders of nose and nasal sinuses: Secondary | ICD-10-CM | POA: Diagnosis not present

## 2018-08-17 ENCOUNTER — Other Ambulatory Visit: Payer: Self-pay | Admitting: Urology

## 2018-08-17 DIAGNOSIS — K862 Cyst of pancreas: Secondary | ICD-10-CM

## 2018-08-17 DIAGNOSIS — C642 Malignant neoplasm of left kidney, except renal pelvis: Secondary | ICD-10-CM

## 2018-10-04 ENCOUNTER — Ambulatory Visit
Admission: RE | Admit: 2018-10-04 | Discharge: 2018-10-04 | Disposition: A | Discharge status: Home / Self Care | Payer: Medicare Other | Source: Ambulatory Visit | Attending: Anesthesiology | Admitting: Anesthesiology

## 2018-10-04 ENCOUNTER — Other Ambulatory Visit: Payer: Self-pay | Admitting: Anesthesiology

## 2018-10-04 ENCOUNTER — Ambulatory Visit
Admission: RE | Admit: 2018-10-04 | Discharge: 2018-10-04 | Disposition: A | Discharge status: Home / Self Care | Payer: Medicare Other | Source: Ambulatory Visit | Attending: Urology | Admitting: Urology

## 2018-10-04 DIAGNOSIS — C642 Malignant neoplasm of left kidney, except renal pelvis: Secondary | ICD-10-CM

## 2018-10-04 DIAGNOSIS — Z85528 Personal history of other malignant neoplasm of kidney: Secondary | ICD-10-CM | POA: Diagnosis not present

## 2018-10-04 DIAGNOSIS — I10 Essential (primary) hypertension: Secondary | ICD-10-CM | POA: Diagnosis not present

## 2018-10-04 DIAGNOSIS — K862 Cyst of pancreas: Secondary | ICD-10-CM

## 2018-10-04 MED ORDER — GADOBENATE DIMEGLUMINE 529 MG/ML IV SOLN
7.0000 mL | Freq: Once | INTRAVENOUS | Status: AC | PRN
  Administered 2018-10-04: 7 mL via INTRAVENOUS

## 2018-10-10 DIAGNOSIS — K862 Cyst of pancreas: Secondary | ICD-10-CM | POA: Diagnosis not present

## 2018-10-10 DIAGNOSIS — C642 Malignant neoplasm of left kidney, except renal pelvis: Secondary | ICD-10-CM | POA: Diagnosis not present

## 2018-10-10 DIAGNOSIS — Z905 Acquired absence of kidney: Secondary | ICD-10-CM | POA: Diagnosis not present

## 2018-10-10 DIAGNOSIS — N281 Cyst of kidney, acquired: Secondary | ICD-10-CM | POA: Diagnosis not present

## 2018-12-02 DIAGNOSIS — Z23 Encounter for immunization: Secondary | ICD-10-CM | POA: Diagnosis not present

## 2019-01-03 DIAGNOSIS — R209 Unspecified disturbances of skin sensation: Secondary | ICD-10-CM | POA: Diagnosis not present

## 2019-01-03 DIAGNOSIS — R7989 Other specified abnormal findings of blood chemistry: Secondary | ICD-10-CM | POA: Diagnosis not present

## 2019-01-03 DIAGNOSIS — E785 Hyperlipidemia, unspecified: Secondary | ICD-10-CM | POA: Diagnosis not present

## 2019-01-03 DIAGNOSIS — R7303 Prediabetes: Secondary | ICD-10-CM | POA: Diagnosis not present

## 2019-01-03 DIAGNOSIS — N400 Enlarged prostate without lower urinary tract symptoms: Secondary | ICD-10-CM | POA: Diagnosis not present

## 2019-01-03 DIAGNOSIS — E871 Hypo-osmolality and hyponatremia: Secondary | ICD-10-CM | POA: Diagnosis not present

## 2019-01-03 DIAGNOSIS — K8020 Calculus of gallbladder without cholecystitis without obstruction: Secondary | ICD-10-CM | POA: Diagnosis not present

## 2019-01-03 DIAGNOSIS — Z125 Encounter for screening for malignant neoplasm of prostate: Secondary | ICD-10-CM | POA: Diagnosis not present

## 2019-01-03 DIAGNOSIS — I10 Essential (primary) hypertension: Secondary | ICD-10-CM | POA: Diagnosis not present

## 2019-01-03 DIAGNOSIS — C649 Malignant neoplasm of unspecified kidney, except renal pelvis: Secondary | ICD-10-CM | POA: Diagnosis not present

## 2019-01-03 DIAGNOSIS — Z0000 Encounter for general adult medical examination without abnormal findings: Secondary | ICD-10-CM | POA: Diagnosis not present

## 2019-01-03 DIAGNOSIS — I493 Ventricular premature depolarization: Secondary | ICD-10-CM | POA: Diagnosis not present

## 2019-02-03 DIAGNOSIS — R7989 Other specified abnormal findings of blood chemistry: Secondary | ICD-10-CM | POA: Diagnosis not present

## 2019-04-21 ENCOUNTER — Ambulatory Visit: Payer: Medicare Other | Attending: Internal Medicine

## 2019-04-21 DIAGNOSIS — Z23 Encounter for immunization: Secondary | ICD-10-CM | POA: Insufficient documentation

## 2019-04-21 NOTE — Progress Notes (Signed)
   Covid-19 Vaccination Clinic  Name:  Taylor Dillon    MRN: [id number] DOB: June 20, 1940  04/21/2019  Mr. Taylor Dillon was observed post Covid-19 immunization for 15 minutes without incidence. He was provided with Vaccine Information Sheet and instruction to access the V-Safe system.   Mr. Taylor Dillon was instructed to call 911 with any severe reactions post vaccine: Marland Kitchen Difficulty breathing  . Swelling of your face and throat  . A fast heartbeat  . A bad rash all over your body  . Dizziness and weakness    Immunizations Administered    Name Date Dose VIS Date Route   Pfizer COVID-19 Vaccine 04/21/2019  9:15 AM 0.3 mL 03/10/2019 Intramuscular   Manufacturer: [place]   Lot: [id number]   [place]: [id number]

## 2019-05-12 ENCOUNTER — Ambulatory Visit: Payer: Medicare Other | Attending: Internal Medicine

## 2019-05-12 DIAGNOSIS — Z23 Encounter for immunization: Secondary | ICD-10-CM | POA: Insufficient documentation

## 2019-05-12 NOTE — Progress Notes (Signed)
   Covid-19 Vaccination Clinic  Name:  Taylor Dillon    MRN: [id number] DOB: Jan 24, 1941  05/12/2019  Taylor Dillon was observed post Covid-19 immunization for 15 minutes without incidence. He was provided with Vaccine Information Sheet and instruction to access the V-Safe system.   Taylor Dillon was instructed to call 911 with any severe reactions post vaccine: Marland Kitchen Difficulty breathing  . Swelling of your face and throat  . A fast heartbeat  . A bad rash all over your body  . Dizziness and weakness    Immunizations Administered    Name Date Dose VIS Date Route   Pfizer COVID-19 Vaccine 05/12/2019 12:47 PM 0.3 mL 03/10/2019 Intramuscular   Manufacturer: [place]   Lot: [id number]   [place]: [id number]

## 2019-06-14 DIAGNOSIS — M2141 Flat foot [pes planus] (acquired), right foot: Secondary | ICD-10-CM | POA: Diagnosis not present

## 2019-10-14 IMAGING — DX DG CHEST 2V
2 series · 2 of 2 positions shown · non-contrast
Comparison: Radiographs September 19, 2015.

CLINICAL DATA: History of left renal cell carcinoma.

EXAM:
CHEST - 2 VIEW

[chest pa]
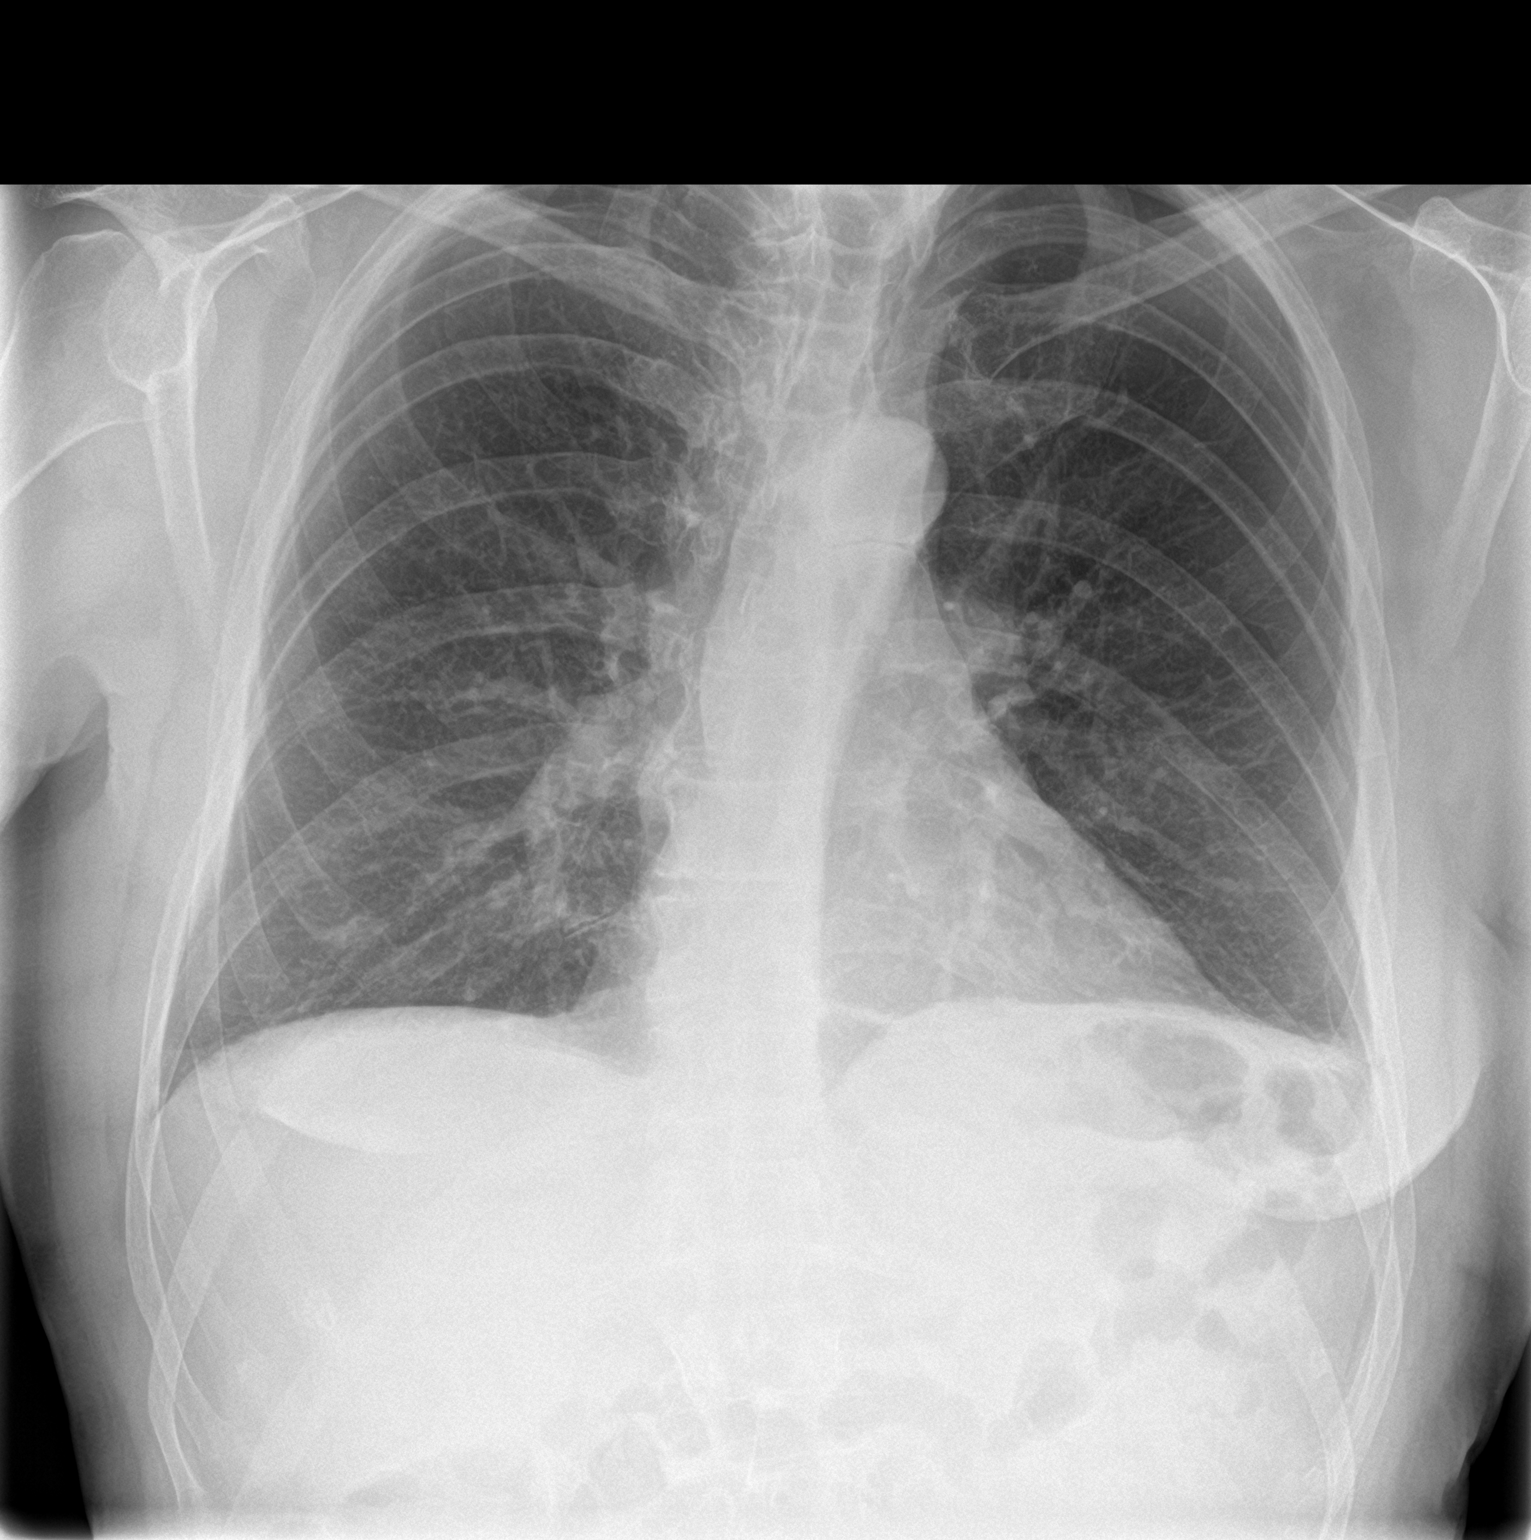

[chest lat]
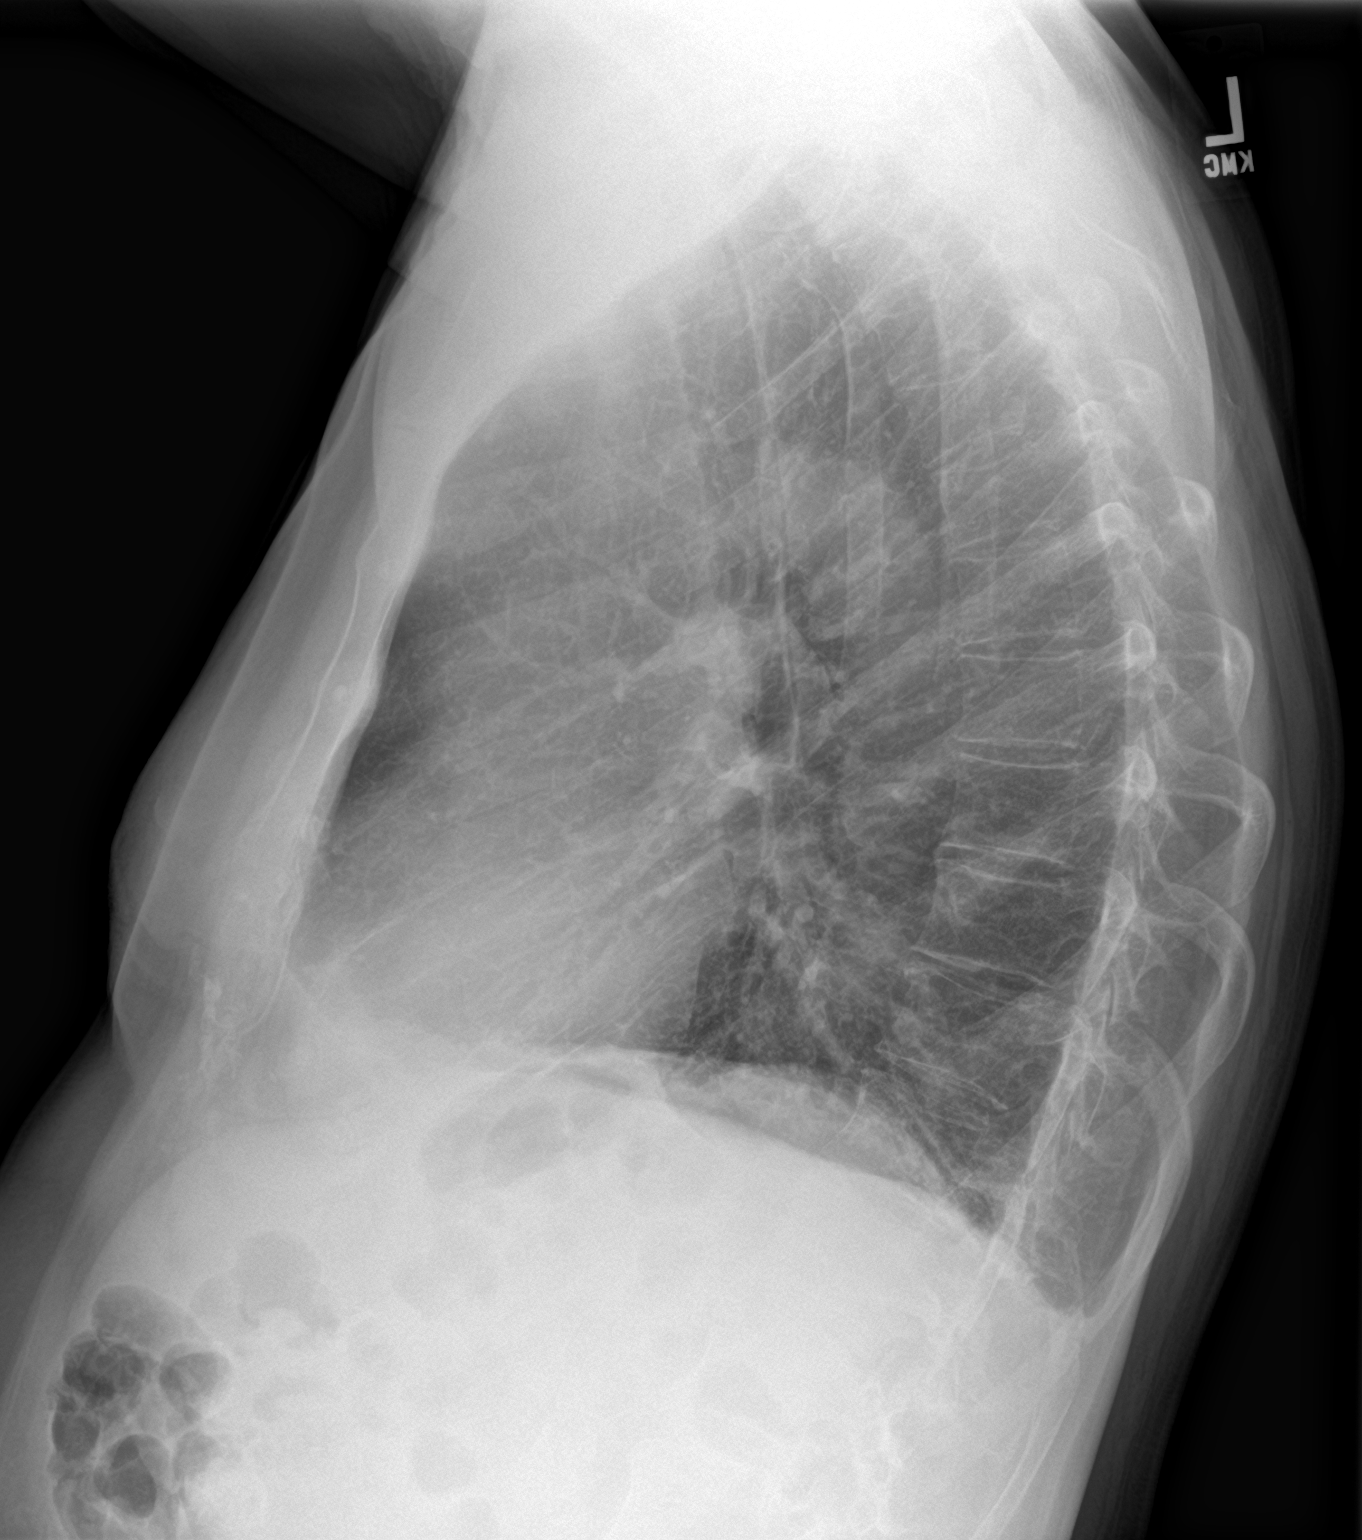

[2 of 2 positions shown; findings below may reference images not displayed]

FINDINGS: The heart size and mediastinal contours are within normal limits.
Both lungs are clear. No pneumothorax or pleural effusion is noted.
The visualized skeletal structures are unremarkable.
IMPRESSION: No active cardiopulmonary disease.

## 2019-10-16 ENCOUNTER — Other Ambulatory Visit: Payer: Self-pay

## 2019-10-16 ENCOUNTER — Ambulatory Visit (HOSPITAL_COMMUNITY)
Admission: RE | Admit: 2019-10-16 | Discharge: 2019-10-16 | Disposition: A | Discharge status: Home / Self Care | Payer: Medicare Other | Source: Ambulatory Visit | Attending: Urology | Admitting: Urology

## 2019-10-16 ENCOUNTER — Other Ambulatory Visit (HOSPITAL_COMMUNITY): Payer: Self-pay | Admitting: Urology

## 2019-10-16 DIAGNOSIS — Z905 Acquired absence of kidney: Secondary | ICD-10-CM | POA: Diagnosis not present

## 2019-10-16 DIAGNOSIS — C642 Malignant neoplasm of left kidney, except renal pelvis: Secondary | ICD-10-CM

## 2019-10-16 DIAGNOSIS — K8689 Other specified diseases of pancreas: Secondary | ICD-10-CM | POA: Diagnosis not present

## 2019-10-16 DIAGNOSIS — K429 Umbilical hernia without obstruction or gangrene: Secondary | ICD-10-CM | POA: Diagnosis not present

## 2019-10-16 DIAGNOSIS — I700 Atherosclerosis of aorta: Secondary | ICD-10-CM | POA: Diagnosis not present

## 2019-10-16 DIAGNOSIS — K862 Cyst of pancreas: Secondary | ICD-10-CM | POA: Diagnosis not present

## 2019-10-16 DIAGNOSIS — J90 Pleural effusion, not elsewhere classified: Secondary | ICD-10-CM | POA: Diagnosis not present

## 2019-10-16 DIAGNOSIS — N281 Cyst of kidney, acquired: Secondary | ICD-10-CM | POA: Diagnosis not present

## 2019-10-16 DIAGNOSIS — C649 Malignant neoplasm of unspecified kidney, except renal pelvis: Secondary | ICD-10-CM | POA: Diagnosis not present

## 2019-11-29 DIAGNOSIS — Z23 Encounter for immunization: Secondary | ICD-10-CM | POA: Diagnosis not present

## 2019-12-25 DIAGNOSIS — Z23 Encounter for immunization: Secondary | ICD-10-CM | POA: Diagnosis not present

## 2020-01-09 DIAGNOSIS — Z0000 Encounter for general adult medical examination without abnormal findings: Secondary | ICD-10-CM | POA: Diagnosis not present

## 2020-01-09 DIAGNOSIS — Z1389 Encounter for screening for other disorder: Secondary | ICD-10-CM | POA: Diagnosis not present

## 2020-01-15 DIAGNOSIS — I10 Essential (primary) hypertension: Secondary | ICD-10-CM | POA: Diagnosis not present

## 2020-01-15 DIAGNOSIS — R7303 Prediabetes: Secondary | ICD-10-CM | POA: Diagnosis not present

## 2020-01-15 DIAGNOSIS — N400 Enlarged prostate without lower urinary tract symptoms: Secondary | ICD-10-CM | POA: Diagnosis not present

## 2020-01-15 DIAGNOSIS — I493 Ventricular premature depolarization: Secondary | ICD-10-CM | POA: Diagnosis not present

## 2020-01-15 DIAGNOSIS — R7989 Other specified abnormal findings of blood chemistry: Secondary | ICD-10-CM | POA: Diagnosis not present

## 2020-01-15 DIAGNOSIS — Z125 Encounter for screening for malignant neoplasm of prostate: Secondary | ICD-10-CM | POA: Diagnosis not present

## 2020-01-15 DIAGNOSIS — C642 Malignant neoplasm of left kidney, except renal pelvis: Secondary | ICD-10-CM | POA: Diagnosis not present

## 2020-08-29 DIAGNOSIS — I493 Ventricular premature depolarization: Secondary | ICD-10-CM | POA: Diagnosis not present

## 2020-08-29 DIAGNOSIS — N400 Enlarged prostate without lower urinary tract symptoms: Secondary | ICD-10-CM | POA: Diagnosis not present

## 2020-08-29 DIAGNOSIS — R7303 Prediabetes: Secondary | ICD-10-CM | POA: Diagnosis not present

## 2020-08-29 DIAGNOSIS — Z85528 Personal history of other malignant neoplasm of kidney: Secondary | ICD-10-CM | POA: Diagnosis not present

## 2020-08-29 DIAGNOSIS — N1832 Chronic kidney disease, stage 3b: Secondary | ICD-10-CM | POA: Diagnosis not present

## 2020-08-29 DIAGNOSIS — E785 Hyperlipidemia, unspecified: Secondary | ICD-10-CM | POA: Diagnosis not present

## 2020-08-29 DIAGNOSIS — I10 Essential (primary) hypertension: Secondary | ICD-10-CM | POA: Diagnosis not present

## 2020-11-19 DIAGNOSIS — I10 Essential (primary) hypertension: Secondary | ICD-10-CM | POA: Diagnosis not present

## 2020-11-19 DIAGNOSIS — N1832 Chronic kidney disease, stage 3b: Secondary | ICD-10-CM | POA: Diagnosis not present

## 2020-11-19 DIAGNOSIS — Z85528 Personal history of other malignant neoplasm of kidney: Secondary | ICD-10-CM | POA: Diagnosis not present

## 2020-11-19 DIAGNOSIS — I493 Ventricular premature depolarization: Secondary | ICD-10-CM | POA: Diagnosis not present

## 2020-11-19 DIAGNOSIS — R7303 Prediabetes: Secondary | ICD-10-CM | POA: Diagnosis not present

## 2020-11-19 DIAGNOSIS — E785 Hyperlipidemia, unspecified: Secondary | ICD-10-CM | POA: Diagnosis not present

## 2020-11-19 DIAGNOSIS — N400 Enlarged prostate without lower urinary tract symptoms: Secondary | ICD-10-CM | POA: Diagnosis not present

## 2020-12-06 DIAGNOSIS — Z23 Encounter for immunization: Secondary | ICD-10-CM | POA: Diagnosis not present

## 2021-01-13 DIAGNOSIS — Z0000 Encounter for general adult medical examination without abnormal findings: Secondary | ICD-10-CM | POA: Diagnosis not present

## 2021-01-13 DIAGNOSIS — Z1389 Encounter for screening for other disorder: Secondary | ICD-10-CM | POA: Diagnosis not present

## 2021-01-20 DIAGNOSIS — Z85528 Personal history of other malignant neoplasm of kidney: Secondary | ICD-10-CM | POA: Diagnosis not present

## 2021-01-20 DIAGNOSIS — E785 Hyperlipidemia, unspecified: Secondary | ICD-10-CM | POA: Diagnosis not present

## 2021-01-20 DIAGNOSIS — I10 Essential (primary) hypertension: Secondary | ICD-10-CM | POA: Diagnosis not present

## 2021-01-20 DIAGNOSIS — R7303 Prediabetes: Secondary | ICD-10-CM | POA: Diagnosis not present

## 2021-01-20 DIAGNOSIS — N400 Enlarged prostate without lower urinary tract symptoms: Secondary | ICD-10-CM | POA: Diagnosis not present

## 2021-01-20 DIAGNOSIS — I493 Ventricular premature depolarization: Secondary | ICD-10-CM | POA: Diagnosis not present

## 2021-01-20 DIAGNOSIS — N1832 Chronic kidney disease, stage 3b: Secondary | ICD-10-CM | POA: Diagnosis not present

## 2021-01-20 DIAGNOSIS — Z125 Encounter for screening for malignant neoplasm of prostate: Secondary | ICD-10-CM | POA: Diagnosis not present

## 2021-02-07 DIAGNOSIS — H60392 Other infective otitis externa, left ear: Secondary | ICD-10-CM | POA: Diagnosis not present

## 2021-06-11 DIAGNOSIS — L308 Other specified dermatitis: Secondary | ICD-10-CM | POA: Diagnosis not present

## 2021-07-21 DIAGNOSIS — R7303 Prediabetes: Secondary | ICD-10-CM | POA: Diagnosis not present

## 2021-07-21 DIAGNOSIS — N1832 Chronic kidney disease, stage 3b: Secondary | ICD-10-CM | POA: Diagnosis not present

## 2021-11-04 IMAGING — DX DG CHEST 2V
2 series · 2 of 2 positions shown · non-contrast
Comparison: Chest x-ray 10/04/2018.

CLINICAL DATA: 79-year-old male with history of renal cell
carcinoma.

EXAM:
CHEST - 2 VIEW

[chest pa]
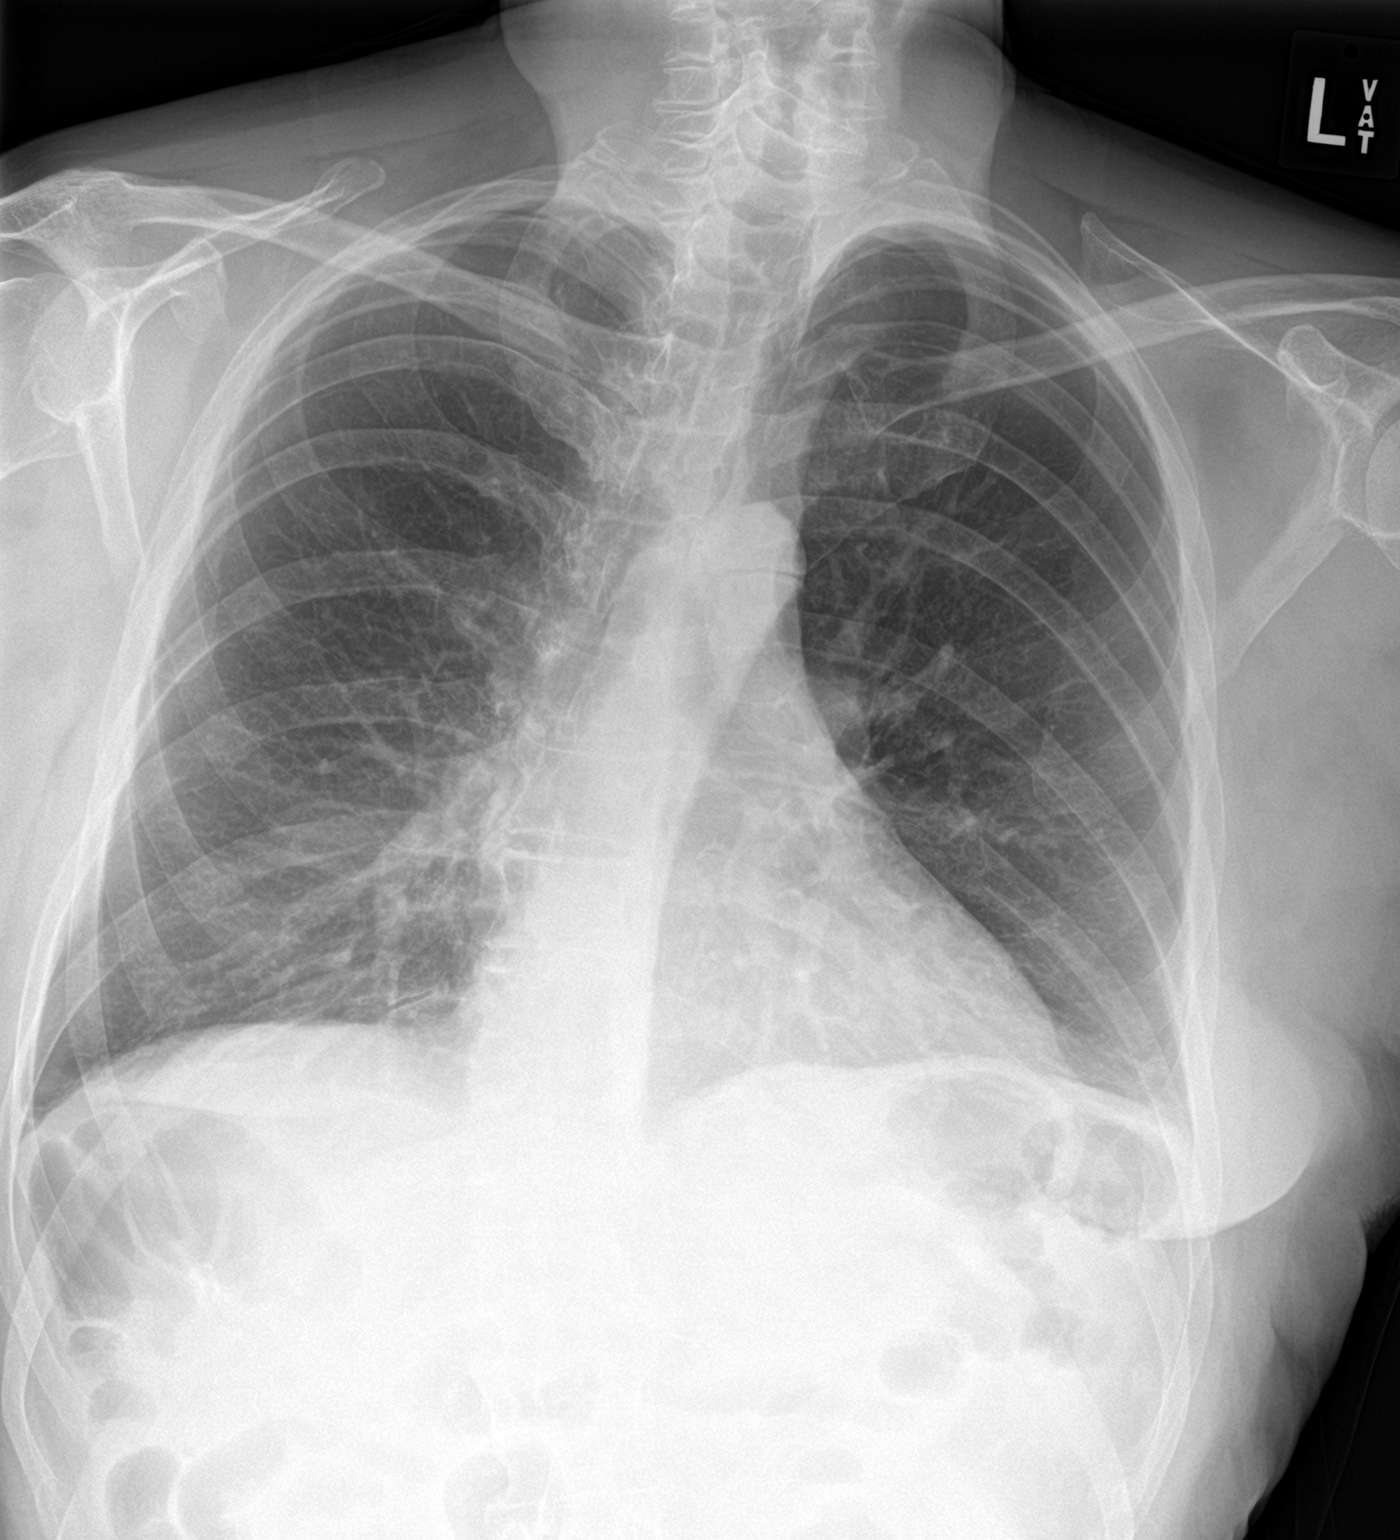

[chest lat]
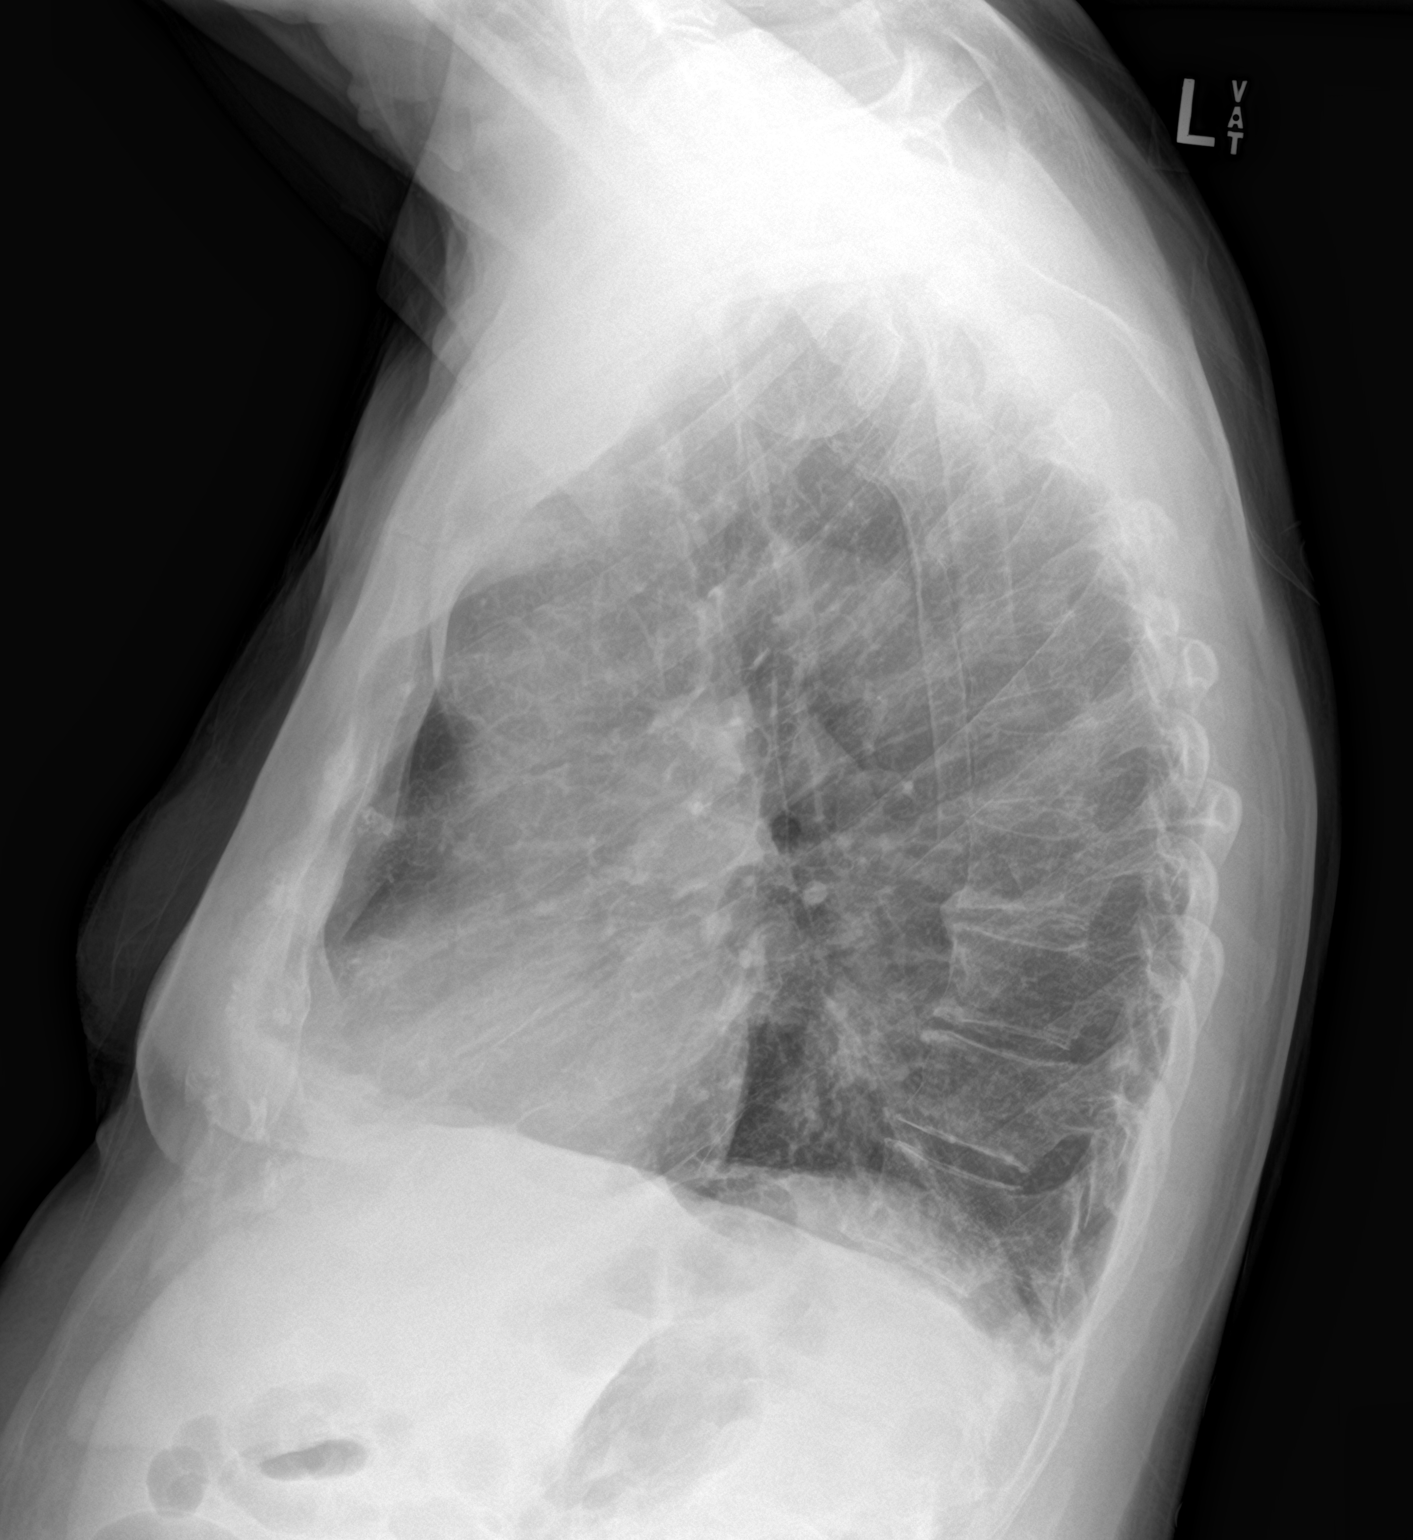

[2 of 2 positions shown; findings below may reference images not displayed]

FINDINGS: Lung volumes are normal. No consolidative airspace disease. No
pleural effusions. No pneumothorax. No pulmonary nodule or mass
noted. Pulmonary vasculature and the cardiomediastinal silhouette
are within normal limits.
IMPRESSION: 1. No definite findings to suggest metastatic disease to the thorax.

## 2021-12-29 DIAGNOSIS — L308 Other specified dermatitis: Secondary | ICD-10-CM | POA: Diagnosis not present

## 2022-01-15 DIAGNOSIS — R7303 Prediabetes: Secondary | ICD-10-CM | POA: Diagnosis not present

## 2022-01-15 DIAGNOSIS — I10 Essential (primary) hypertension: Secondary | ICD-10-CM | POA: Diagnosis not present

## 2022-01-15 DIAGNOSIS — N1832 Chronic kidney disease, stage 3b: Secondary | ICD-10-CM | POA: Diagnosis not present

## 2022-01-15 DIAGNOSIS — Z1389 Encounter for screening for other disorder: Secondary | ICD-10-CM | POA: Diagnosis not present

## 2022-01-15 DIAGNOSIS — Z0000 Encounter for general adult medical examination without abnormal findings: Secondary | ICD-10-CM | POA: Diagnosis not present

## 2022-01-15 DIAGNOSIS — E785 Hyperlipidemia, unspecified: Secondary | ICD-10-CM | POA: Diagnosis not present

## 2022-01-15 DIAGNOSIS — Z6824 Body mass index (BMI) 24.0-24.9, adult: Secondary | ICD-10-CM | POA: Diagnosis not present

## 2022-02-24 DIAGNOSIS — Z23 Encounter for immunization: Secondary | ICD-10-CM | POA: Diagnosis not present

## 2022-07-06 DIAGNOSIS — H43813 Vitreous degeneration, bilateral: Secondary | ICD-10-CM | POA: Diagnosis not present

## 2022-07-22 DIAGNOSIS — Z905 Acquired absence of kidney: Secondary | ICD-10-CM | POA: Diagnosis not present

## 2022-07-22 DIAGNOSIS — Z85528 Personal history of other malignant neoplasm of kidney: Secondary | ICD-10-CM | POA: Diagnosis not present

## 2022-07-22 DIAGNOSIS — Z6825 Body mass index (BMI) 25.0-25.9, adult: Secondary | ICD-10-CM | POA: Diagnosis not present

## 2022-07-22 DIAGNOSIS — N1832 Chronic kidney disease, stage 3b: Secondary | ICD-10-CM | POA: Diagnosis not present

## 2022-07-22 DIAGNOSIS — E785 Hyperlipidemia, unspecified: Secondary | ICD-10-CM | POA: Diagnosis not present

## 2022-07-22 DIAGNOSIS — I10 Essential (primary) hypertension: Secondary | ICD-10-CM | POA: Diagnosis not present

## 2022-07-30 DIAGNOSIS — I10 Essential (primary) hypertension: Secondary | ICD-10-CM | POA: Diagnosis not present

## 2022-08-10 DIAGNOSIS — Z23 Encounter for immunization: Secondary | ICD-10-CM | POA: Diagnosis not present

## 2022-11-11 DIAGNOSIS — Z23 Encounter for immunization: Secondary | ICD-10-CM | POA: Diagnosis not present

## 2022-12-13 DIAGNOSIS — Z23 Encounter for immunization: Secondary | ICD-10-CM | POA: Diagnosis not present

## 2023-01-20 DIAGNOSIS — Z6824 Body mass index (BMI) 24.0-24.9, adult: Secondary | ICD-10-CM | POA: Diagnosis not present

## 2023-01-20 DIAGNOSIS — E785 Hyperlipidemia, unspecified: Secondary | ICD-10-CM | POA: Diagnosis not present

## 2023-01-20 DIAGNOSIS — Z0000 Encounter for general adult medical examination without abnormal findings: Secondary | ICD-10-CM | POA: Diagnosis not present

## 2023-01-20 DIAGNOSIS — Z1331 Encounter for screening for depression: Secondary | ICD-10-CM | POA: Diagnosis not present

## 2023-01-20 DIAGNOSIS — I10 Essential (primary) hypertension: Secondary | ICD-10-CM | POA: Diagnosis not present

## 2023-01-20 DIAGNOSIS — Z85528 Personal history of other malignant neoplasm of kidney: Secondary | ICD-10-CM | POA: Diagnosis not present

## 2023-01-20 DIAGNOSIS — R7303 Prediabetes: Secondary | ICD-10-CM | POA: Diagnosis not present

## 2023-02-17 DIAGNOSIS — I10 Essential (primary) hypertension: Secondary | ICD-10-CM | POA: Diagnosis not present

## 2023-02-17 DIAGNOSIS — N1832 Chronic kidney disease, stage 3b: Secondary | ICD-10-CM | POA: Diagnosis not present

## 2023-02-18 DIAGNOSIS — N184 Chronic kidney disease, stage 4 (severe): Secondary | ICD-10-CM | POA: Diagnosis not present

## 2023-02-18 DIAGNOSIS — C642 Malignant neoplasm of left kidney, except renal pelvis: Secondary | ICD-10-CM | POA: Diagnosis not present

## 2023-02-18 DIAGNOSIS — E785 Hyperlipidemia, unspecified: Secondary | ICD-10-CM | POA: Diagnosis not present

## 2023-02-18 DIAGNOSIS — N1832 Chronic kidney disease, stage 3b: Secondary | ICD-10-CM | POA: Diagnosis not present

## 2023-02-18 DIAGNOSIS — I129 Hypertensive chronic kidney disease with stage 1 through stage 4 chronic kidney disease, or unspecified chronic kidney disease: Secondary | ICD-10-CM | POA: Diagnosis not present

## 2023-02-18 DIAGNOSIS — R799 Abnormal finding of blood chemistry, unspecified: Secondary | ICD-10-CM | POA: Diagnosis not present

## 2023-02-22 ENCOUNTER — Ambulatory Visit
Admission: RE | Admit: 2023-02-22 | Discharge: 2023-02-22 | Disposition: A | Discharge status: Home / Self Care | Payer: Medicare Other | Source: Ambulatory Visit | Attending: Nephrology | Admitting: Nephrology

## 2023-02-22 ENCOUNTER — Other Ambulatory Visit: Payer: Self-pay | Admitting: Nephrology

## 2023-02-22 DIAGNOSIS — N189 Chronic kidney disease, unspecified: Secondary | ICD-10-CM | POA: Diagnosis not present

## 2023-02-22 DIAGNOSIS — N184 Chronic kidney disease, stage 4 (severe): Secondary | ICD-10-CM

## 2023-03-18 DIAGNOSIS — N184 Chronic kidney disease, stage 4 (severe): Secondary | ICD-10-CM | POA: Diagnosis not present

## 2023-04-10 ENCOUNTER — Inpatient Hospital Stay (HOSPITAL_COMMUNITY): Payer: Medicare Other

## 2023-04-10 ENCOUNTER — Emergency Department (HOSPITAL_COMMUNITY): Payer: Medicare Other

## 2023-04-10 ENCOUNTER — Other Ambulatory Visit: Payer: Self-pay

## 2023-04-10 ENCOUNTER — Inpatient Hospital Stay (HOSPITAL_COMMUNITY)
Admission: EM | Admit: 2023-04-10 | Discharge: 2023-04-16 | DRG: 643 | Disposition: A | Discharge status: Home / Self Care | Payer: Medicare Other | Attending: Family Medicine | Admitting: Family Medicine

## 2023-04-10 ENCOUNTER — Encounter (HOSPITAL_COMMUNITY): Payer: Self-pay

## 2023-04-10 DIAGNOSIS — E872 Acidosis, unspecified: Secondary | ICD-10-CM | POA: Diagnosis not present

## 2023-04-10 DIAGNOSIS — E785 Hyperlipidemia, unspecified: Secondary | ICD-10-CM | POA: Diagnosis present

## 2023-04-10 DIAGNOSIS — Z7901 Long term (current) use of anticoagulants: Secondary | ICD-10-CM | POA: Diagnosis not present

## 2023-04-10 DIAGNOSIS — R918 Other nonspecific abnormal finding of lung field: Secondary | ICD-10-CM | POA: Diagnosis not present

## 2023-04-10 DIAGNOSIS — Z9049 Acquired absence of other specified parts of digestive tract: Secondary | ICD-10-CM | POA: Diagnosis not present

## 2023-04-10 DIAGNOSIS — G8929 Other chronic pain: Secondary | ICD-10-CM | POA: Diagnosis present

## 2023-04-10 DIAGNOSIS — D631 Anemia in chronic kidney disease: Secondary | ICD-10-CM | POA: Diagnosis present

## 2023-04-10 DIAGNOSIS — R531 Weakness: Secondary | ICD-10-CM

## 2023-04-10 DIAGNOSIS — Z905 Acquired absence of kidney: Secondary | ICD-10-CM | POA: Diagnosis not present

## 2023-04-10 DIAGNOSIS — E782 Mixed hyperlipidemia: Secondary | ICD-10-CM

## 2023-04-10 DIAGNOSIS — D638 Anemia in other chronic diseases classified elsewhere: Secondary | ICD-10-CM | POA: Diagnosis not present

## 2023-04-10 DIAGNOSIS — Z85528 Personal history of other malignant neoplasm of kidney: Secondary | ICD-10-CM | POA: Diagnosis not present

## 2023-04-10 DIAGNOSIS — I10 Essential (primary) hypertension: Secondary | ICD-10-CM | POA: Diagnosis present

## 2023-04-10 DIAGNOSIS — N1832 Chronic kidney disease, stage 3b: Secondary | ICD-10-CM | POA: Diagnosis present

## 2023-04-10 DIAGNOSIS — R0989 Other specified symptoms and signs involving the circulatory and respiratory systems: Secondary | ICD-10-CM | POA: Diagnosis not present

## 2023-04-10 DIAGNOSIS — N401 Enlarged prostate with lower urinary tract symptoms: Secondary | ICD-10-CM | POA: Diagnosis present

## 2023-04-10 DIAGNOSIS — I129 Hypertensive chronic kidney disease with stage 1 through stage 4 chronic kidney disease, or unspecified chronic kidney disease: Secondary | ICD-10-CM | POA: Diagnosis present

## 2023-04-10 DIAGNOSIS — J189 Pneumonia, unspecified organism: Secondary | ICD-10-CM | POA: Diagnosis not present

## 2023-04-10 DIAGNOSIS — Z79899 Other long term (current) drug therapy: Secondary | ICD-10-CM | POA: Diagnosis not present

## 2023-04-10 DIAGNOSIS — Z1152 Encounter for screening for COVID-19: Secondary | ICD-10-CM

## 2023-04-10 DIAGNOSIS — E871 Hypo-osmolality and hyponatremia: Secondary | ICD-10-CM | POA: Diagnosis present

## 2023-04-10 DIAGNOSIS — I4891 Unspecified atrial fibrillation: Secondary | ICD-10-CM | POA: Diagnosis not present

## 2023-04-10 DIAGNOSIS — I480 Paroxysmal atrial fibrillation: Secondary | ICD-10-CM | POA: Diagnosis present

## 2023-04-10 DIAGNOSIS — R338 Other retention of urine: Secondary | ICD-10-CM | POA: Diagnosis present

## 2023-04-10 DIAGNOSIS — E222 Syndrome of inappropriate secretion of antidiuretic hormone: Principal | ICD-10-CM | POA: Diagnosis present

## 2023-04-10 DIAGNOSIS — J90 Pleural effusion, not elsewhere classified: Secondary | ICD-10-CM | POA: Diagnosis not present

## 2023-04-10 DIAGNOSIS — N400 Enlarged prostate without lower urinary tract symptoms: Secondary | ICD-10-CM | POA: Diagnosis not present

## 2023-04-10 LAB — COMPREHENSIVE METABOLIC PANEL WITH GFR
ALT: 37 U/L (ref 0–44)
AST: 45 U/L — ABNORMAL HIGH (ref 15–41)
Albumin: 3.7 g/dL (ref 3.5–5.0)
Alkaline Phosphatase: 82 U/L (ref 38–126)
Anion gap: 15 (ref 5–15)
BUN: 19 mg/dL (ref 8–23)
CO2: 16 mmol/L — ABNORMAL LOW (ref 22–32)
Calcium: 7.8 mg/dL — ABNORMAL LOW (ref 8.9–10.3)
Chloride: 83 mmol/L — ABNORMAL LOW (ref 98–111)
Creatinine, Ser: 1.19 mg/dL (ref 0.61–1.24)
GFR, Estimated: >60 mL/min (ref >60)
Glucose, Bld: 135 mg/dL — ABNORMAL HIGH (ref 70–99)
Potassium: 4 mmol/L (ref 3.5–5.1)
Sodium: 114 mmol/L — CL (ref 135–145)
Total Bilirubin: 1.1 mg/dL (ref 0.0–1.2)
Total Protein: 6.9 g/dL (ref 6.5–8.1)

## 2023-04-10 LAB — RESP PANEL BY RT-PCR (RSV, FLU A&B, COVID)  RVPGX2
Influenza A by PCR: NEGATIVE
Influenza B by PCR: NEGATIVE
Resp Syncytial Virus by PCR: NEGATIVE
SARS Coronavirus 2 by RT PCR: NEGATIVE

## 2023-04-10 LAB — CBC WITH DIFFERENTIAL/PLATELET
Abs Immature Granulocytes: 0.03 10*3/uL (ref 0.00–0.07)
Abs Immature Granulocytes: 0.04 K/uL (ref 0.00–0.07)
Basophils Absolute: 0 10*3/uL (ref 0.0–0.1)
Basophils Absolute: 0 K/uL (ref 0.0–0.1)
Basophils Relative: 0 %
Basophils Relative: 0 %
Eosinophils Absolute: 0 10*3/uL (ref 0.0–0.5)
Eosinophils Absolute: 0 K/uL (ref 0.0–0.5)
Eosinophils Relative: 0 %
Eosinophils Relative: 1 %
HCT: 31.4 % — ABNORMAL LOW (ref 39.0–52.0)
HCT: 33.3 % — ABNORMAL LOW (ref 39.0–52.0)
Hemoglobin: 11.5 g/dL — ABNORMAL LOW (ref 13.0–17.0)
Hemoglobin: 12 g/dL — ABNORMAL LOW (ref 13.0–17.0)
Immature Granulocytes: 1 %
Immature Granulocytes: 1 %
Lymphocytes Relative: 14 %
Lymphocytes Relative: 9 %
Lymphs Abs: 0.6 K/uL — ABNORMAL LOW (ref 0.7–4.0)
Lymphs Abs: 0.8 10*3/uL (ref 0.7–4.0)
MCH: 31.7 pg (ref 26.0–34.0)
MCH: 31.9 pg (ref 26.0–34.0)
MCHC: 36 g/dL (ref 30.0–36.0)
MCHC: 36.6 g/dL — ABNORMAL HIGH (ref 30.0–36.0)
MCV: 87 fL (ref 80.0–100.0)
MCV: 88.1 fL (ref 80.0–100.0)
Monocytes Absolute: 0.8 10*3/uL (ref 0.1–1.0)
Monocytes Absolute: 0.8 K/uL (ref 0.1–1.0)
Monocytes Relative: 11 %
Monocytes Relative: 13 %
Neutro Abs: 4.3 10*3/uL (ref 1.7–7.7)
Neutro Abs: 5.4 K/uL (ref 1.7–7.7)
Neutrophils Relative %: 71 %
Neutrophils Relative %: 79 %
Platelets: 168 10*3/uL (ref 150–400)
Platelets: 185 K/uL (ref 150–400)
RBC: 3.61 MIL/uL — ABNORMAL LOW (ref 4.22–5.81)
RBC: 3.78 MIL/uL — ABNORMAL LOW (ref 4.22–5.81)
RDW: 11.6 % (ref 11.5–15.5)
RDW: 11.8 % (ref 11.5–15.5)
WBC: 6 10*3/uL (ref 4.0–10.5)
WBC: 6.9 K/uL (ref 4.0–10.5)
nRBC: 0 % (ref 0.0–0.2)
nRBC: 0 % (ref 0.0–0.2)

## 2023-04-10 LAB — CREATININE, URINE, RANDOM: Creatinine, Urine: 82 mg/dL

## 2023-04-10 LAB — SODIUM, URINE, RANDOM: Sodium, Ur: <10 mmol/L

## 2023-04-10 MED ORDER — ACETAMINOPHEN 650 MG RE SUPP: 650.0000 mg | Freq: Four times a day (QID) | RECTAL | Status: DC | PRN

## 2023-04-10 MED ORDER — AZITHROMYCIN 250 MG PO TABS
500.0000 mg | ORAL_TABLET | Freq: Once | ORAL | Status: AC
  Administered 2023-04-10: 500 mg via ORAL
  Filled 2023-04-10: qty 2

## 2023-04-10 MED ORDER — SODIUM CHLORIDE 0.9 % IV SOLN
1.0000 g | INTRAVENOUS | Status: DC
  Administered 2023-04-11 – 2023-04-12 (×2): 1 g via INTRAVENOUS
  Filled 2023-04-10 (×3): qty 10

## 2023-04-10 MED ORDER — AMOXICILLIN-POT CLAVULANATE 875-125 MG PO TABS
1.0000 | ORAL_TABLET | Freq: Once | ORAL | Status: AC
  Administered 2023-04-10: 1 via ORAL
  Filled 2023-04-10: qty 1

## 2023-04-10 MED ORDER — ACETAMINOPHEN 325 MG PO TABS
650.0000 mg | ORAL_TABLET | Freq: Four times a day (QID) | ORAL | Status: DC | PRN
  Administered 2023-04-13 – 2023-04-14 (×2): 650 mg via ORAL
  Filled 2023-04-10 (×2): qty 2

## 2023-04-10 MED ORDER — ONDANSETRON HCL 4 MG/2ML IJ SOLN: 4.0000 mg | Freq: Four times a day (QID) | INTRAMUSCULAR | Status: DC | PRN

## 2023-04-10 MED ORDER — MELATONIN 3 MG PO TABS: 3.0000 mg | ORAL_TABLET | Freq: Every evening | ORAL | Status: DC | PRN

## 2023-04-10 MED ORDER — SODIUM CHLORIDE 0.9 % IV SOLN
500.0000 mg | INTRAVENOUS | Status: DC
  Filled 2023-04-10: qty 5

## 2023-04-10 NOTE — ED Provider Notes (Signed)
 [place] EMERGENCY DEPARTMENT AT [facility] Provider Note   CSN: [id number] Arrival date & time: 04/10/23  1845     History  Chief Complaint  Patient presents with   Weakness    Taylor Dillon is a 83 y.o. male.  HPI Patient presents with weakness.  He was well until about 4 days ago when he began having URI-like illness as well as progressive weakness, anorexia, fatigue.  No focal weakness, no focal pain.  There is associated cough, that is mild with no vomiting, no fever.  Patient has a history of A-fib, is anticoagulated.  He denies obvious bleeding areas.    Home Medications Prior to Admission medications   Medication Sig Start Date End Date Taking? Authorizing Provider  amLODipine  (NORVASC ) 10 MG tablet Take 10 mg by mouth every evening. 06/24/16   [provider]  Multiple Vitamins-Minerals (CENTRUM SILVER ADULT 50+ PO) Take 1 tablet by mouth daily.    [provider]  simvastatin  (ZOCOR ) 20 MG tablet Take 20 mg by mouth at bedtime.    [provider]  terazosin  (HYTRIN ) 2 MG capsule Take 2 mg by mouth 2 (two) times daily.    [provider]      Allergies    Patient has no known allergies.    Review of Systems   Review of Systems  Physical Exam Updated Vital Signs BP (!) 141/97   Pulse 100   Temp (!) 97.5 F (36.4 C) (Oral)   Resp 18   Ht 5' 9 (1.753 m)   Wt 74 kg   SpO2 98%   BMI 24.09 kg/m  Physical Exam Vitals and nursing note reviewed.  Constitutional:      General: He is not in acute distress.    Appearance: He is well-developed.  HENT:     Head: Normocephalic and atraumatic.  Eyes:     Conjunctiva/sclera: Conjunctivae normal.  Cardiovascular:     Rate and Rhythm: Normal rate. Rhythm irregular.  Pulmonary:     Effort: Pulmonary effort is normal. No respiratory distress.     Breath sounds: No stridor.  Abdominal:     General: There is no distension.  Skin:    General: Skin is warm and dry.   Neurological:     Mental Status: He is alert and oriented to person, place, and time.     ED Results / Procedures / Treatments   Labs (all labs ordered are listed, but only abnormal results are displayed) Labs Reviewed  COMPREHENSIVE METABOLIC PANEL - Abnormal; Notable for the following components:      Result Value   Sodium 114 (*)    Chloride 83 (*)    CO2 16 (*)    Glucose, Bld 135 (*)    Calcium  7.8 (*)    AST 45 (*)    All other components within normal limits  CBC WITH DIFFERENTIAL/PLATELET - Abnormal; Notable for the following components:   RBC 3.78 (*)    Hemoglobin 12.0 (*)    HCT 33.3 (*)    Lymphs Abs 0.6 (*)    All other components within normal limits  RESP PANEL BY RT-PCR (RSV, FLU A&B, COVID)  [id number]  URINALYSIS, ROUTINE W REFLEX MICROSCOPIC  CBC WITH DIFFERENTIAL/PLATELET  COMPREHENSIVE METABOLIC PANEL  MAGNESIUM  MAGNESIUM  PHOSPHORUS  SODIUM, URINE, RANDOM  CREATININE, URINE, RANDOM  OSMOLALITY  OSMOLALITY, URINE  URIC ACID  TSH  BASIC METABOLIC PANEL    EKG None  Radiology DG Chest  2 View Result Date: 04/10/2023 CLINICAL DATA:  Weakness.  Congestion.  Question pneumonia. EXAM: CHEST - 2 VIEW COMPARISON:  10/16/2019 FINDINGS: Heart is upper normal in size. Mediastinal contours are stable. Minimal ill-defined left perihilar opacity. No pulmonary edema, large pleural effusion, or pneumothorax. No acute osseous abnormalities are seen. IMPRESSION: Minimal ill-defined left perihilar opacity may represent atelectasis or infection. Electronically Signed   By: Andrea Gasman M.D.   On: 04/10/2023 19:40    Procedures Procedures    Medications Ordered in ED Medications  acetaminophen  (TYLENOL ) tablet 650 mg (has no administration in time range)    Or  acetaminophen  (TYLENOL ) suppository 650 mg (has no administration in time range)  melatonin tablet 3 mg (has no administration in time range)  ondansetron  (ZOFRAN ) injection 4 mg (has no  administration in time range)  azithromycin  (ZITHROMAX ) 500 mg in sodium chloride  0.9 % 250 mL IVPB (has no administration in time range)  cefTRIAXone  (ROCEPHIN ) 1 g in sodium chloride  0.9 % 100 mL IVPB (has no administration in time range)  azithromycin  (ZITHROMAX ) tablet 500 mg (500 mg Oral Given 04/10/23 2028)  amoxicillin -clavulanate (AUGMENTIN ) 875-125 MG per tablet 1 tablet (1 tablet Oral Given 04/10/23 2028)    ED Course/ Medical Decision Making/ A&P                                 Medical Decision Making Elderly male with A-fib presents with cough, fatigue, weakness.  Differential including infectious process, electrolyte abnormalities, dehydration, all considered.  X-ray ordered, labs ordered.  Cardiac 111 A-fib abnormal Pulse ox 90% room air normal   Amount and/or Complexity of Data Reviewed External Data Reviewed: notes. Labs: ordered. Decision-making details documented in ED Course. Radiology: ordered and independent interpretation performed. Decision-making details documented in ED Course. ECG/medicine tests: ordered and independent interpretation performed. Decision-making details documented in ED Course.  Risk Prescription drug management. Decision regarding hospitalization. Diagnosis or treatment significantly limited by social determinants of health.   Update: Patient found to have left-sided opacification concerning for pneumonia versus be nodule.  Patient started antibiotics.  However, patient subsequent labs notable for hyponatremia, sodium 114.  Patient has no seizure activity, no confusion.  Suspicion for SIADH versus deconditioning, CT chest ordered for further evaluation of x-ray abnormality, patient has started fluid resuscitation.  ECG otherwise reassuring for acute changes from electrolyte abnormalities, patient admitted for further monitoring, management   Final Clinical Impression(s) / ED Diagnoses Final diagnoses:  Multifocal pneumonia  Acute hyponatremia    CRITICAL CARE Performed by: Lamar Salen Total critical care time: 35 minutes Critical care time was exclusive of separately billable procedures and treating other patients. Critical care was necessary to treat or prevent imminent or life-threatening deterioration. Critical care was time spent personally by me on the following activities: development of treatment plan with patient and/or surrogate as well as nursing, discussions with consultants, evaluation of patient's response to treatment, examination of patient, obtaining history from patient or surrogate, ordering and performing treatments and interventions, ordering and review of laboratory studies, ordering and review of radiographic studies, pulse oximetry and re-evaluation of patient's condition.    Salen Lamar, MD 04/10/23 2206

## 2023-04-10 NOTE — ED Triage Notes (Addendum)
 Patient BIB [facility] from home. Complaining of weakness and feels like he has pneumonia or a cold. Feels congestion, exhaustion, diarrhea, no appetite. Ambulatory.

## 2023-04-10 NOTE — H&P (Signed)
 History and Physical      Taylor Dillon FMW:[id number] DOB: 05/10/1940 DOA: 04/10/2023; DOS: 04/10/2023  PCP: Cyrena Gwenn SQUIBB, MD (Inactive) (will further assess) Patient coming from: home   I have personally briefly reviewed patient's old medical records in [facility]  Chief Complaint: sob   HPI: Taylor Dillon is a 83 y.o. male with medical history significant for renal cell carcinoma status post left nephrectomy in March 2016, BPH, essential hypertension, hyperlipidemia, who is admitted to [facility] on 04/10/2023 with community-acquired pneumonia after presenting from home to [facility] ED complaining of shortness of breath.   Patient reports 4 to 5 days of shortness of breath associated with new onset cough, subjective fever in the absence of chills, full body rigors, or generalized myalgias.  He conveys that his shortness of breath is not been associate with any orthopnea, PND, or peripheral edema.  He denies any associated chest pain, palpitations, diaphoresis, dizziness, presyncope, or syncope.  No hemoptysis or wheezing.  No recent lower extremity erythema or new calf tenderness.  Over the last 4 to 5 days, he is also noted generalized weakness in the absence of any acute focal weakness.  Denies any associated recent headache, neck stiffness, diarrhea, dysuria, or gross hematuria.  His medical history is notable for renal cell carcinoma in March 2016, at which time he underwent left nephrectomy.  At the time of diagnosis of his renal cell carcinoma in March 2016, he was noted to be hyponatremic, with sodium levels in the range of 1 17-1 22.  His most recent prior serum sodium level was found to be 141 in April 2018.  No ensuing serum sodium levels available per my initial chart review, including review of care everywhere.  Denies any alcohol consumption for the last 20 years.  He denies any recent acute focal weakness nor any acute focal numbness/paresthesias.  No recent acute  change in vision, facial droop, slurred speech, vertigo.  Denies any recent trauma.  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  Medical history is notable for BPH.  Per my chart review, no prior documented history of paroxysmal atrial fibrillation.  No prior echocardiogram on file.      ED Course:  Vital signs in the ED were notable for the following: Afebrile; heart rates in the 80s to low 100s; systolic blood pressures in the 140s; respiratory rate 18, and oxygen saturation 98% on room air.  Labs were notable for the following: CMP notable for the following: Sodium 114 compared to most recent prior value 141 in April 2018, chloride 83, bicarbonate 16, anion gap 15, creatinine 1.19 compared to 1.51 in April 2018, glucose 135, calcium  adjusted for mild hypoalbuminemia noted to be 8.0, albumin 3.7.  Otherwise, liver enzymes are within normal limits.  Magnesium level 2.1.  CBC notable for white blood cell count 6900, hemoglobin 12.0 associated Neuraceq/Norocarp properties and nonelevated RDW and compared to most recent prior hemoglobin value of 12.4 in April 2018.  Urinalysis shows no white blood cells and was leukocyte esterase/nitrate negative, will demonstrating no bacteria and no evidence of squamous epithelial cells.  COVID, influenza, RSV PCR were all negative.  Per my interpretation, EKG in ED demonstrated the following: Atrial fibrillation with PVC and heart rate 110, no evidence of T wave or ST changes, including no evidence of ST elevation.  Imaging in  the ED, per corresponding formal radiology read, was notable for the following: 2 view chest x-ray shows left perihilar airspace opacity concerning for infection, will demonstrate no evidence of edema, effusion, or pneumothorax.  CT chest without contrast was ordered by EDP to further evaluate patient's suspected  pneumonia as well as his hyponatremia, with result currently pending.  While in the ED, the following were administered: Augmentin , azithromycin .  Subsequently, the patient was admitted for further evaluation management of suspected community-acquired pneumonia, as well as hyponatremia of unclear chronicity, with presentation also notable for generalized weakness, with the presence of atrial fibrillation, which appears to represent a new diagnosis for him, and with additional labs notable for hypocalcemia.      Review of Systems: As per HPI otherwise 10 point review of systems negative.   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 YRS AGO due to ETOH abuse - no Alcohol x 20 yrs    Past Surgical History:  Procedure Laterality Date   APPENDECTOMY  1974   CATARACTS REMOVED  2014   HERNIA REPAIR  2002   INGUINAL HERNIA REPAIR Right 06/15/2014   Procedure: HERNIA REPAIR INGUINAL ADULT;  Surgeon: Vicenta Poli, MD;  Location: [facility];  Service: General;  Laterality: Right;   INSERTION OF MESH Right 06/15/2014   Procedure: INSERTION OF MESH;  Surgeon: Vicenta Poli, MD;  Location: [facility];  Service: General;  Laterality: Right;   ROBOT ASSISTED LAPAROSCOPIC NEPHRECTOMY Left 06/15/2014   Procedure: ROBOTIC ASSISTED LAPAROSCOPIC NEPHRECTOMY;  Surgeon: Ricardo Likens, MD;  Location: [facility];  Service: Urology;  Laterality: Left;   TONSILLECTOMY      Social History:  reports that he has never smoked. He has never used smokeless tobacco. He reports that he does not drink alcohol and does not use drugs.   No Known Allergies  Family History  Problem Relation Age of Onset   Neuropathy Neg Hx     Family history reviewed and not pertinent    Prior to Admission medications   Medication Sig Start Date End Date Taking? Authorizing Provider  amLODipine  (NORVASC ) 10 MG tablet Take  10 mg by mouth every evening. 06/24/16   [provider]  Multiple Vitamins-Minerals (CENTRUM SILVER ADULT 50+ PO) Take 1 tablet by mouth daily.    [provider]  simvastatin  (ZOCOR ) 20 MG tablet Take 20 mg by mouth at bedtime.    [provider]  terazosin  (HYTRIN ) 2 MG capsule Take 2 mg by mouth 2 (two) times daily.    [provider]     Objective    Physical Exam: Vitals:   04/10/23 1850 04/10/23 1852  BP:  (!) 141/97  Pulse:  100  Resp:  18  Temp:  (!) 97.5 F (36.4 C)  TempSrc:  Oral  SpO2:  98%  Weight: 74 kg   Height: 5' 9 (1.753 m)     General: appears to be stated age; alert, oriented Skin: warm, dry, no rash Head:  AT/[place] Mouth:  Oral mucosa membranes appear dry, normal dentition Neck: supple; trachea midline Heart: Irregular; did not appreciate any M/R/G Lungs: CTAB, did not appreciate any wheezes, rales, or rhonchi Abdomen: + BS; soft, ND, NT Vascular: 2+ pedal pulses b/l; 2+ radial pulses b/l Extremities: no peripheral edema, no muscle wasting Neuro: strength and sensation intact in upper and  lower extremities b/l    Labs on Admission: I have personally reviewed following labs and imaging studies  CBC: Recent Labs  Lab 04/10/23 1945  WBC 6.9  NEUTROABS 5.4  HGB 12.0*  HCT 33.3*  MCV 88.1  PLT 185   Basic Metabolic Panel: Recent Labs  Lab 04/10/23 1945  NA 114*  K 4.0  CL 83*  CO2 16*  GLUCOSE 135*  BUN 19  CREATININE 1.19  CALCIUM  7.8*   GFR: Estimated Creatinine Clearance: 46.2 mL/min (by C-G formula based on SCr of 1.19 mg/dL). Liver Function Tests: Recent Labs  Lab 04/10/23 1945  AST 45*  ALT 37  ALKPHOS 82  BILITOT 1.1  PROT 6.9  ALBUMIN 3.7   No results for input(s): LIPASE, AMYLASE in the last 168 hours. No results for input(s): AMMONIA in the last 168 hours. Coagulation Profile: No results for input(s): INR, PROTIME in the last 168 hours. Cardiac Enzymes: No results  for input(s): CKTOTAL, CKMB, CKMBINDEX, TROPONINI in the last 168 hours. BNP (last 3 results) No results for input(s): PROBNP in the last 8760 hours. HbA1C: No results for input(s): HGBA1C in the last 72 hours. CBG: No results for input(s): GLUCAP in the last 168 hours. Lipid Profile: No results for input(s): CHOL, HDL, LDLCALC, TRIG, CHOLHDL, LDLDIRECT in the last 72 hours. Thyroid  Function Tests: No results for input(s): TSH, T4TOTAL, FREET4, T3FREE, THYROIDAB in the last 72 hours. Anemia Panel: No results for input(s): VITAMINB12, FOLATE, FERRITIN, TIBC, IRON, RETICCTPCT in the last 72 hours. Urine analysis:    Component Value Date/Time   COLORURINE YELLOW 07/04/2016 1321   APPEARANCEUR CLEAR 07/04/2016 1321   LABSPEC 1.019 07/04/2016 1321   PHURINE 5.0 07/04/2016 1321   GLUCOSEU NEGATIVE 07/04/2016 1321   HGBUR NEGATIVE 07/04/2016 1321   BILIRUBINUR NEGATIVE 07/04/2016 1321   KETONESUR NEGATIVE 07/04/2016 1321   PROTEINUR 100 (A) 07/04/2016 1321   UROBILINOGEN 0.2 11/01/2014 1045   NITRITE NEGATIVE 07/04/2016 1321   LEUKOCYTESUR NEGATIVE 07/04/2016 1321    Radiological Exams on Admission: DG Chest 2 View Result Date: 04/10/2023 CLINICAL DATA:  Weakness.  Congestion.  Question pneumonia. EXAM: CHEST - 2 VIEW COMPARISON:  10/16/2019 FINDINGS: Heart is upper normal in size. Mediastinal contours are stable. Minimal ill-defined left perihilar opacity. No pulmonary edema, large pleural effusion, or pneumothorax. No acute osseous abnormalities are seen. IMPRESSION: Minimal ill-defined left perihilar opacity may represent atelectasis or infection. Electronically Signed   By: Andrea Gasman M.D.   On: 04/10/2023 19:40      Assessment/Plan    Principal Problem:   CAP (community acquired pneumonia) Active Problems:   Hyponatremia   Generalized weakness   Hypocalcemia   Paroxysmal atrial fibrillation (HCC)   BPH (benign  prostatic hyperplasia)   Essential hypertension   HLD (hyperlipidemia)   Anemia of chronic disease     #) Community-acquired pneumonia: dx on the basis of for 5 days of shortness of breath associated new onset cough, subjective fever, with today's chest x-ray showing evidence of left perihilar airspace opacity concerning for pneumonia. Of note, in the absence of objective fever or leukocytosis, SIRS criteria are not met for sepsis at this time.  Appears hemodynamically stable, without e/o hypotension. COVID/ RSV/ influenza PCR all negative.     In the ED today, received azithromycin  as well as Augmentin .  Will continue the azithromycin  and replace Augmentin  with Rocephin  for empiric coverage of suspected community-acquired pneumonia.  No e/o additional underlying infxn at this time, including urinalysis that  was inconsistent with UTI.  CT chest, as ordered by EDP, is currently pending.   Plan:  Continue azithromycin .start Rocephin . Check Procalcitonin level. As needed acetaminophen  for fever.  Repeat CMP, CBC in the morning.  Check serum phosphorus level.  Prn Tessalon  Perles for cough. Incentive spirometry. Check strep pneumonia urine antigen.  Follow-up for result of CT chest, as above.                  #) hypo-osmolar hyponatremia: Presenting serum sodium level of 114, with unclear chronicity given the greater than 6-year gap in serum sodium data points dating back to April 2018 when sodium level was found to be 141. Suspect an element of hypovolumeia, with suspected contribution from physical exam evidence dehydration. Differential also includes the possibility of a contribution from SIADH, particularly given suspected presenting community-acquired pneumonia in the setting of evidence of left perihilar airspace opacity on chest x-ray.  He is also at risk for development of SIADH on the basis of urinary retention, which is notable given his history of BPH.  Will pursue scheduled  postvoid residual bladder scans to further evaluate this possibility.   Given that the patient has a prior history of hyponatremia in March 2016 when he was diagnosed with renal cell carcinoma, there is concern for potential associated paraneoplastic contributions towards his presenting hyponatremia.  Given this history, will also pursue CT abdomen/pelvis with contrast.  in general, will provide gentle IV fluids to attend to suspected contribution from dehydration, while further evaluating for any additional contributing factors, including SIADH, as further detailed below. No overt pharmacologic contributions. Of note, no evidence of associated acute focal neurologic deficits, seizure-like activity, and no report of recent trauma.    Plan: monitor strict I's and O's and daily weights.  check random urine sodium, urine osmolality.  Check serum osmolality to confirm suspected hypoosmolar etiology.  check serum uric acid level, as SIADH can be associated with hypouricemia due to hyperuricuria.  Check TSH.  Every 4 hour BMPs ordered through 1300 on 04/11/2023.  Gentle IV fluids in the form of normal saline at 75 cc/h x 12 hours.  Acute 4-hour postvoid residual scans, as above.  Follow-up result of CT chest.  Additionally, will pursue CT abdomen/pelvis with contrast, as above.  Add on serum ethanol level.  To further assess volume status as a relates to the patient's hyponatremia, will also add on BNP level.                  #) Generalized weakness: 4 to 5-day duration of generalized weakness, in the absence of any evidence of acute focal neurologic deficits, including no evidence of acute focal weakness to suggest acute CVA.  Suspect potentially multifactorial contributions from physiologic stressors stemming from presenting suspected community-acquired pneumonia as well as contribution from hyponatremia, although the chronicity of the latter is unclear, as outlined above.  No e/o additional  infectious process at this time, including urinalysis that was inconsistent with UTI, while COVID, influenza, RSV PCR were all negative..   Will further eval for any additional contributions from endocrine/metabolic sources, as detailed below.   Plan: work-up and management of presenting community-acquired pneumonia as well as hyponatremia, as described above. PT/OT consults ordered for the AM. Fall precautions. CMP/CBC in the AM. Check TSH, serum Mg level. Check B12, CPK level.                 #) Hypocalcemia: Adjusted serum calcium  level mildly low at 8.0, with  concomitant magnesium level 2.1.  Plan: Calcium  gluconate 1 g IV over 1 hour x 1 dose.  Repeat CMP in the morning.  Check serum phosphorus level and repeat magnesium level with morning labs.                  #) Paroxysmal atrial fibrillation: Presenting EKG suggestive of atrial fibrillation.appears to represent a new diagnosis warranting additional evaluation with chart review revealing no prior documentation of a history of atrial fibrillation.  Appears rate controlled, with heart rates in the 80s to low 100s.  In the setting of no prior echocardiogram on file, will pursue echocardiogram in the morning, will also assist with determination of valvular versus nonvalvular pathology and to further determine CHA2DS2-VASc score and assessing for indication for anticoagulation for thromboembolic prophylaxis purposes.  Not currently on any AV nodal blocking agents at home.  Plan: monitor strict I's & O's and daily weights. CMP/CBC in AM.  12.5 mg p.o. twice daily.  Monitor on telemetry.  Add on BNP, as above.  Check INR.  Add on serum ethanol level.  Check TSH.                  #) Benign Prostatic Hyperplasia:  documented h/o such; on terazosin  as outpatient.  Notable in the context of his presenting hyponatremia given that worsening urinary retention can induce SIADH cascade.  Plan: monitor strict I's  & O's and daily weights. Repeat CMP in AM.  Continue outpatient terazosin .  Scheduled every 4 hour postvoid residual bladder scans, as above.                    #) Essential Hypertension: documented h/o such, with outpatient antihypertensive regimen including amlodipine , losartan.  SBP's in the ED today: 140s mmHg. in the setting of suspected presenting acute infection with lower community-acquired pneumonia, in the context of plan for initiation of low-dose beta-blocker, will hold him antihypertensive medications for now.  Plan: Close monitoring of subsequent BP via routine VS. hold home losartan as well as amlodipine  for now.                   #) Hyperlipidemia: documented h/o such. On simvastatin  as outpatient.   Plan: continue home statin.  Follow-up for result of CPK level, as above.                  #) Anemia of chronic disease: Documented history of such, a/w with baseline hgb range 12-13, with presenting hgb consistent with this range, in the absence of any overt evidence of active bleed.     Plan: Repeat CBC in the morning.  Check INR.     DVT prophylaxis: SCD's   Code Status: Full code Family Communication: none Disposition Plan: Per Rounding Team Consults called: none;  Admission status: Inpatient    I SPENT GREATER THAN 75  MINUTES IN CLINICAL CARE TIME/MEDICAL DECISION-MAKING IN COMPLETING THIS ADMISSION.     Eva NOVAK Tanny Harnack DO [facility] From 7PM - 7AM   04/10/2023, 9:39 PM

## 2023-04-11 ENCOUNTER — Encounter (HOSPITAL_COMMUNITY): Payer: Self-pay | Admitting: Internal Medicine

## 2023-04-11 ENCOUNTER — Inpatient Hospital Stay (HOSPITAL_COMMUNITY): Payer: Medicare Other

## 2023-04-11 DIAGNOSIS — J189 Pneumonia, unspecified organism: Secondary | ICD-10-CM | POA: Diagnosis not present

## 2023-04-11 DIAGNOSIS — E785 Hyperlipidemia, unspecified: Secondary | ICD-10-CM | POA: Diagnosis present

## 2023-04-11 DIAGNOSIS — N400 Enlarged prostate without lower urinary tract symptoms: Secondary | ICD-10-CM | POA: Diagnosis present

## 2023-04-11 DIAGNOSIS — D638 Anemia in other chronic diseases classified elsewhere: Secondary | ICD-10-CM | POA: Diagnosis present

## 2023-04-11 DIAGNOSIS — I10 Essential (primary) hypertension: Secondary | ICD-10-CM | POA: Diagnosis present

## 2023-04-11 DIAGNOSIS — I480 Paroxysmal atrial fibrillation: Secondary | ICD-10-CM | POA: Diagnosis present

## 2023-04-11 DIAGNOSIS — R531 Weakness: Secondary | ICD-10-CM

## 2023-04-11 DIAGNOSIS — I4891 Unspecified atrial fibrillation: Secondary | ICD-10-CM

## 2023-04-11 LAB — PROCALCITONIN: Procalcitonin: 0.11 ng/mL

## 2023-04-11 LAB — BASIC METABOLIC PANEL
Anion gap: 7 (ref 5–15)
Anion gap: 8 (ref 5–15)
Anion gap: 9 (ref 5–15)
BUN: 18 mg/dL (ref 8–23)
BUN: 20 mg/dL (ref 8–23)
BUN: 18 mg/dL (ref 8–23)
CO2: 16 mmol/L — ABNORMAL LOW (ref 22–32)
CO2: 17 mmol/L — ABNORMAL LOW (ref 22–32)
CO2: 18 mmol/L — ABNORMAL LOW (ref 22–32)
Calcium: 7.7 mg/dL — ABNORMAL LOW (ref 8.9–10.3)
Calcium: 8 mg/dL — ABNORMAL LOW (ref 8.9–10.3)
Calcium: 8.2 mg/dL — ABNORMAL LOW (ref 8.9–10.3)
Chloride: 90 mmol/L — ABNORMAL LOW (ref 98–111)
Chloride: 91 mmol/L — ABNORMAL LOW (ref 98–111)
Chloride: 91 mmol/L — ABNORMAL LOW (ref 98–111)
Creatinine, Ser: 1.3 mg/dL — ABNORMAL HIGH (ref 0.61–1.24)
Creatinine, Ser: 1.43 mg/dL — ABNORMAL HIGH (ref 0.61–1.24)
Creatinine, Ser: 1.39 mg/dL — ABNORMAL HIGH (ref 0.61–1.24)
GFR, Estimated: 54 mL/min — ABNORMAL LOW (ref >60)
GFR, Estimated: 48 mL/min — ABNORMAL LOW (ref >60)
GFR, Estimated: 50 mL/min — ABNORMAL LOW (ref >60)
Glucose, Bld: 118 mg/dL — ABNORMAL HIGH (ref 70–99)
Glucose, Bld: 140 mg/dL — ABNORMAL HIGH (ref 70–99)
Glucose, Bld: 98 mg/dL (ref 70–99)
Potassium: 4.1 mmol/L (ref 3.5–5.1)
Potassium: 4.1 mmol/L (ref 3.5–5.1)
Potassium: 4.1 mmol/L (ref 3.5–5.1)
Sodium: 113 mmol/L — CL (ref 135–145)
Sodium: 116 mmol/L — CL (ref 135–145)
Sodium: 118 mmol/L — CL (ref 135–145)

## 2023-04-11 LAB — ECHOCARDIOGRAM COMPLETE
Area-P 1/2: 5.07 cm2
Calc EF: 64.1 %
Height: 69 in
MV M vel: 5.08 m/s
MV Peak grad: 103.2 mm[Hg]
Radius: 0.5 cm
S' Lateral: 2.7 cm
Single Plane A2C EF: 66.8 %
Single Plane A4C EF: 58.8 %
Weight: 2836 [oz_av]

## 2023-04-11 LAB — COMPREHENSIVE METABOLIC PANEL
ALT: 35 U/L (ref 0–44)
AST: 38 U/L (ref 15–41)
Albumin: 3.2 g/dL — ABNORMAL LOW (ref 3.5–5.0)
Alkaline Phosphatase: 77 U/L (ref 38–126)
Anion gap: 10 (ref 5–15)
BUN: 19 mg/dL (ref 8–23)
CO2: 16 mmol/L — ABNORMAL LOW (ref 22–32)
Calcium: 8 mg/dL — ABNORMAL LOW (ref 8.9–10.3)
Chloride: 86 mmol/L — ABNORMAL LOW (ref 98–111)
Creatinine, Ser: 1.27 mg/dL — ABNORMAL HIGH (ref 0.61–1.24)
GFR, Estimated: 56 mL/min — ABNORMAL LOW (ref >60)
Glucose, Bld: 122 mg/dL — ABNORMAL HIGH (ref 70–99)
Potassium: 4.2 mmol/L (ref 3.5–5.1)
Sodium: 112 mmol/L — CL (ref 135–145)
Total Bilirubin: 0.8 mg/dL (ref 0.0–1.2)
Total Protein: 6.2 g/dL — ABNORMAL LOW (ref 6.5–8.1)

## 2023-04-11 LAB — GLUCOSE, CAPILLARY
Glucose-Capillary: 86 mg/dL (ref 70–99)
Glucose-Capillary: 128 mg/dL — ABNORMAL HIGH (ref 70–99)
Glucose-Capillary: 114 mg/dL — ABNORMAL HIGH (ref 70–99)
Glucose-Capillary: 109 mg/dL — ABNORMAL HIGH (ref 70–99)

## 2023-04-11 LAB — OSMOLALITY, URINE: Osmolality, Ur: 309 mosm/kg (ref 300–900)

## 2023-04-11 LAB — URINALYSIS, ROUTINE W REFLEX MICROSCOPIC
Bacteria, UA: NONE SEEN
Bilirubin Urine: NEGATIVE
Glucose, UA: NEGATIVE mg/dL
Hgb urine dipstick: NEGATIVE
Ketones, ur: 5 mg/dL — AB
Leukocytes,Ua: NEGATIVE
Nitrite: NEGATIVE
Protein, ur: 100 mg/dL — AB
Specific Gravity, Urine: 1.009 (ref 1.005–1.030)
pH: 5 (ref 5.0–8.0)

## 2023-04-11 LAB — VITAMIN B12: Vitamin B-12: 1192 pg/mL — ABNORMAL HIGH (ref 180–914)

## 2023-04-11 LAB — PHOSPHORUS: Phosphorus: 2.3 mg/dL — ABNORMAL LOW (ref 2.5–4.6)

## 2023-04-11 LAB — MAGNESIUM: Magnesium: 2.1 mg/dL (ref 1.7–2.4)

## 2023-04-11 LAB — BRAIN NATRIURETIC PEPTIDE: B Natriuretic Peptide: 485.9 pg/mL — ABNORMAL HIGH (ref 0.0–100.0)

## 2023-04-11 LAB — ETHANOL: Alcohol, Ethyl (B): <10 mg/dL (ref <10)

## 2023-04-11 LAB — PROTIME-INR
INR: 1.3 — ABNORMAL HIGH (ref 0.8–1.2)
Prothrombin Time: 16.2 s — ABNORMAL HIGH (ref 11.4–15.2)

## 2023-04-11 LAB — OSMOLALITY: Osmolality: 251 mosm/kg — ABNORMAL LOW (ref 275–295)

## 2023-04-11 LAB — CK: Total CK: 388 U/L (ref 49–397)

## 2023-04-11 LAB — TSH: TSH: 0.531 u[IU]/mL (ref 0.350–4.500)

## 2023-04-11 LAB — URIC ACID: Uric Acid, Serum: 6.8 mg/dL (ref 3.7–8.6)

## 2023-04-11 MED ORDER — TERAZOSIN HCL 5 MG PO CAPS
5.0000 mg | ORAL_CAPSULE | Freq: Two times a day (BID) | ORAL | Status: DC
  Administered 2023-04-11 – 2023-04-16 (×11): 5 mg via ORAL
  Filled 2023-04-11 (×11): qty 1

## 2023-04-11 MED ORDER — CALCIUM GLUCONATE-NACL 1-0.675 GM/50ML-% IV SOLN
1.0000 g | Freq: Once | INTRAVENOUS | Status: AC
  Administered 2023-04-11: 1000 mg via INTRAVENOUS
  Filled 2023-04-11: qty 50

## 2023-04-11 MED ORDER — SIMVASTATIN 20 MG PO TABS
20.0000 mg | ORAL_TABLET | Freq: Every day | ORAL | Status: DC
  Administered 2023-04-11 – 2023-04-15 (×5): 20 mg via ORAL
  Filled 2023-04-11 (×5): qty 1

## 2023-04-11 MED ORDER — ENOXAPARIN SODIUM 80 MG/0.8ML IJ SOSY
1.0000 mg/kg | PREFILLED_SYRINGE | Freq: Two times a day (BID) | INTRAMUSCULAR | Status: DC
  Administered 2023-04-11: 80 mg via SUBCUTANEOUS
  Filled 2023-04-11: qty 0.8

## 2023-04-11 MED ORDER — METOPROLOL TARTRATE 12.5 MG HALF TABLET
12.5000 mg | ORAL_TABLET | Freq: Two times a day (BID) | ORAL | Status: DC
  Administered 2023-04-11 – 2023-04-14 (×8): 12.5 mg via ORAL
  Filled 2023-04-11 (×8): qty 1

## 2023-04-11 MED ORDER — AZITHROMYCIN 250 MG PO TABS
500.0000 mg | ORAL_TABLET | Freq: Every day | ORAL | Status: AC
  Administered 2023-04-11 – 2023-04-14 (×4): 500 mg via ORAL
  Filled 2023-04-11 (×4): qty 2

## 2023-04-11 MED ORDER — BENZONATATE 100 MG PO CAPS
100.0000 mg | ORAL_CAPSULE | Freq: Three times a day (TID) | ORAL | Status: DC
  Administered 2023-04-11 – 2023-04-16 (×15): 100 mg via ORAL
  Filled 2023-04-11 (×15): qty 1

## 2023-04-11 MED ORDER — APIXABAN 5 MG PO TABS
5.0000 mg | ORAL_TABLET | Freq: Two times a day (BID) | ORAL | Status: DC
Start: 2023-04-11 — End: 2023-04-16
  Administered 2023-04-11 – 2023-04-16 (×10): 5 mg via ORAL
  Filled 2023-04-11 (×10): qty 1

## 2023-04-11 MED ORDER — SODIUM CHLORIDE 1 G PO TABS
2.0000 g | ORAL_TABLET | Freq: Three times a day (TID) | ORAL | Status: DC
  Administered 2023-04-11 (×2): 2 g via ORAL
  Filled 2023-04-11 (×2): qty 2

## 2023-04-11 MED ORDER — BENZONATATE 100 MG PO CAPS: 200.0000 mg | ORAL_CAPSULE | Freq: Three times a day (TID) | ORAL | Status: DC | PRN

## 2023-04-11 MED ORDER — SODIUM CHLORIDE 0.9 % IV SOLN
INTRAVENOUS | Status: DC
Start: 2023-04-11 — End: 2023-04-11

## 2023-04-11 MED ORDER — LEVALBUTEROL HCL 0.63 MG/3ML IN NEBU: 0.6300 mg | INHALATION_SOLUTION | Freq: Four times a day (QID) | RESPIRATORY_TRACT | Status: DC | PRN

## 2023-04-11 NOTE — Plan of Care (Signed)
   Problem: Education: Goal: Knowledge of General Education information will improve Description: Including pain rating scale, medication(s)/side effects and non-pharmacologic comfort measures Outcome: Progressing   Problem: Clinical Measurements: Goal: Will remain free from infection Outcome: Progressing Goal: Diagnostic test results will improve Outcome: Progressing

## 2023-04-11 NOTE — Progress Notes (Signed)
 PHARMACY - ANTICOAGULATION CONSULT NOTE  Pharmacy Consult for Enoxaparin  > Apixaban  Indication: atrial fibrillation  No Known Allergies  Patient Measurements: Height: 5' 9 (175.3 cm) Weight: 80.4 kg (177 lb 4 oz) IBW/kg (Calculated) : 70.7  Vital Signs: Temp: 98.2 F (36.8 C) (01/12 1206) Temp Source: Oral (01/12 1049) BP: 113/64 (01/12 1206) Pulse Rate: 92 (01/12 1206)  Labs: Recent Labs    04/10/23 1945 04/10/23 2339 04/11/23 0625  HGB 12.0* 11.5*  --   HCT 33.3* 31.4*  --   PLT 185 168  --   LABPROT  --   --  16.2*  INR  --   --  1.3*  CREATININE 1.19 1.27*  --   CKTOTAL  --   --  388    Estimated Creatinine Clearance: 43.3 mL/min (A) (by C-G formula based on SCr of 1.27 mg/dL (H)).   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 Pharmacy consulted for Enoxaparin  dosing (treatment dose) for afib. Patient not on anticoagulants PTA. CBC: Hgb 11.5, Plt 168K. SCr 1.3 with CrCl ~ 42 ml/min.   Goal of Therapy:  Anti-Xa level 0.6-1 units/ml 4hrs after LMWH dose given Monitor platelets by anticoagulation protocol: Yes   Plan:  Enoxaparin  1mg /kg SQ q12h Monitor renal function, CBC at least q72h, and for s/sx of bleeding    Almando Brawley, PharmD, BCPS Clinical Pharmacist 04/11/2023,12:30 PM   Addendum: Now asked to transition to Apixaban . One dose of Enoxaparin  1mg /kg SQ given at 1346.   Plan: Stop Enoxaparin  Start Apixaban  5mg  PO BID tonight Pharmacy to provide education prior to discharge    Tyera Hansley, PharmD, BCPS Clinical Pharmacist 04/11/2023 2:11 PM

## 2023-04-11 NOTE — Hospital Course (Addendum)
 PMH of RCC SP left nephrectomy, BPH, HTN, HLD present to the hospital with complaints of cough and shortness of breath.  No episodes of fever.  Patient actually felt that he may have a common cold. Currently being treated for community-acquired pneumonia, newly diagnosed A-fib as well as severe hyponatremia.  Assessment and Plan: Community-acquired pneumonia. No history of COPD or asthma. No sick contact. Chest x-ray shows possibility of multifocal pneumonia. COVID-negative. Currently on IV antibiotic.  Minimal elevation of procalcitonin. Will continue with antibiotic. Add as needed Xopenex . Continue cough suppressant.  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 Serum sodium significantly low. Recently in October serum sodium was normal more than 135. Patient remains asymptomatic thankfully. S Osm 251,Ur Osm 309,Ur NA less than 10. While urine sodium less than 10 is not consistent with SIADH.  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 Continue salt tablets.  Continue frequent sodium monitoring.  If sodium level remains below 120 on 1/14, will consult nephrology and switch to urea  packets.  A-fib. Rate controlled. Likely paroxysmal and new onset. Currently persistent though. Continue Lopressor .  Started new this admission. Discussed with patient in detail.  No significant history of bleeding.  No recent surgeries.  Patient is fairly active at his baseline, swim multiple laps in a day.  Discussed rationale of anticoagulation and options available. Currently agreeable to anticoagulation. Echocardiogram shows preserved EF. No vital abnormality. On Eliquis . Outpatient referral to A-fib clinic will be placed.  BPH. CKD 3B. History of RCC. Patient sees urology outpatient Dr. Alvaro.  Due to worsening renal function patient was also sent to Dr. Gearline nephrology. 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 Currently serum creatinine is  actually better than his October 2024 values of 2.3. Will monitor.  Non-anion gap metabolic acidosis. Etiology not clear. 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 Monitor for now.  Anticipate the patient might correct its own.  Otherwise will initiate bicarb therapy if worsens.  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 H&H stable.  Monitor.  HTN. Home regimen includes amlodipine  10 mg daily, Lasix  as needed, losartan 25 mg daily (dose was recently increased by nephrology). Currently blood pressure is actually adequate and patient is only on 12.5 mg of metoprolol  twice daily for rate control.  Will continue the same. Also on terazosin  twice daily.  Monitor.

## 2023-04-11 NOTE — Discharge Instructions (Signed)

## 2023-04-11 NOTE — Progress Notes (Signed)
  Echocardiogram 2D Echocardiogram has been performed.  Janalyn Harder 04/11/2023, 9:05 AM

## 2023-04-11 NOTE — Progress Notes (Signed)
 Date and time results received: 04/11/23 0126 (use smartphrase ".now" to insert current time)  Test: Na+ Critical Value: 112  Name of Provider Notified: Dr Arlean Hopping 04/11/2023 01:27

## 2023-04-11 NOTE — Progress Notes (Signed)
 [facility] Progress Note Patient: Taylor Dillon FMW:[id number] DOB: Mar 11, 1941 DOA: 04/10/2023  DOS: the patient was seen and examined on 04/11/2023  Brief Hospital Course: PMH of RCC SP left nephrectomy, BPH, HTN, HLD present to the hospital with complaints of cough and shortness of breath.  No episodes of fever.  Patient actually felt that he may have a common cold. Currently being treated for community-acquired pneumonia, newly diagnosed A-fib as well as severe hyponatremia.  Assessment and Plan: Community-acquired pneumonia. No history of COPD or asthma. No sick contact. Chest x-ray shows possibility of multifocal pneumonia. COVID-negative. Currently on IV antibiotic.  Minimal elevation of procalcitonin. Will continue with antibiotic. Add as needed Xopenex . Continue cough suppressant.  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 Serum sodium significantly low. Recently in October serum sodium was normal more than 135. Patient remains asymptomatic thankfully. S Osm 251,Ur Osm 309,Ur NA less than 10. While urine sodium less than 10 is not consistent with SIADH.  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 Will discontinue IV fluid.  Initiate fluid restriction.  Initiate salt tablets.  If the sodium level does not respond appropriately will consult nephrology on 1/13. Rate of correction : 4 to 6 mMol/L in a 24-hour period  A-fib. Rate controlled. Likely paroxysmal and new onset. Currently persistent though. Continue Lopressor .  Started new this admission. Discussed with patient in detail.  No significant history of bleeding.  No recent surgeries.  Patient is fairly active at his baseline, swim multiple laps in a day.  Discussed rationale of anticoagulation and options available.  Patient currently agreeable to initiate anticoagulation. Initially was given Lovenox  until echocardiogram information was available. Once the echocardiogram  information is available and the EF is normal as well as valvular abnormalities are not seen, patient will be in good candidate to initiate on anticoagulation with Eliquis  which patient agreed upon. Monitor. Outpatient referral to A-fib clinic will be placed.  BPH. CKD 3B. History of RCC. Patient sees urology outpatient Dr. Alvaro.  Due to worsening renal function patient was also sent to Dr. Gearline nephrology. 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 Currently serum creatinine is actually better than his October 2024 values of 2.3. Will monitor.  Non-anion gap metabolic acidosis. Etiology not clear. 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 Monitor for now.  Anticipate the patient might correct its own.  Otherwise will initiate bicarb therapy if worsens.  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 H&H stable.  Monitor.  HTN. Home regimen includes amlodipine  10 mg daily, Lasix  as needed, losartan 25 mg daily (dose was recently increased by nephrology). Currently blood pressure is actually adequate and patient is only on 12.5 mg of metoprolol  twice daily for rate control.  Will continue the same. Also on terazosin  twice daily.  Monitor.   Subjective: No nausea no vomiting no fever no chills.  Ongoing cough.  Physical Exam: General: in Mild distress, No Rash Cardiovascular: S1 and S2 Present, No Murmur Respiratory: Good respiratory effort, Bilateral Air entry present. No Crackles, bilateral expiratory wheezes Abdomen: Bowel Sound present, No tenderness Extremities: No edema Neuro: Alert and oriented x3, no new focal deficit  Data Reviewed: I have Reviewed nursing notes, Vitals, and Lab results. Since last encounter, pertinent lab results CBC and BMP   . I have ordered test including CBC and BMP TSH and free T4 and cortisol  .   Disposition: Status is: Inpatient Remains inpatient appropriate because: Monitor for improvement in sodium level  SCDs Start: 04/10/23 2137  apixaban  (ELIQUIS ) tablet 5 mg   Family  Communication: No one at bedside Level of care: Telemetry   Vitals:   04/11/23 0211 04/11/23 0629 04/11/23 1049 04/11/23 1206  BP: (!) 147/79 136/72 (!) 104/56 113/64  Pulse: 92  (!) 43 92  Resp: 18 18 16    Temp: 98.1 F (36.7 C) 98.3 F (36.8 C) 98 F (36.7 C) 98.2 F (36.8 C)  TempSrc: Oral Oral Oral   SpO2: 92% 92% 90% 92%  Weight:      Height:         Author: Yetta Blanch, MD 04/11/2023 5:31 PM  Please look on [email or web address] to find out who is on call.

## 2023-04-11 NOTE — Evaluation (Signed)
 Physical Therapy Evaluation Patient Details Name: Taylor Dillon MRN: [id number] DOB: 1940-04-04 Today's Date: 04/11/2023  History of Present Illness  Pt admitted from home 2* SOB/weakness and dx with CAP, hyponatremia and with hx of renal cell carcinoma s/p L nephrectomy  Clinical Impression  Pt admitted as above and presenting with functional mobility limitations 2* decreased endurance, generalized weakness and ambulatory balance deficits.  Pt should progress to dc home with PRN assist of family and would benefit from RW for home use.  Pt declines follow up PT intervention stating he is feeling better and feels he can work towards regaining strength/endurance on his own.        If plan is discharge home, recommend the following: Assistance with cooking/housework;Assist for transportation;Help with stairs or ramp for entrance;A little help with walking and/or transfers;A little help with bathing/dressing/bathroom   Can travel by private vehicle        Equipment Recommendations Rolling walker (2 wheels)  Recommendations for Other Services       Functional Status Assessment Patient has had a recent decline in their functional status and demonstrates the ability to make significant improvements in function in a reasonable and predictable amount of time.     Precautions / Restrictions Precautions Precautions: Fall Restrictions Weight Bearing Restrictions Per Provider Order: No      Mobility  Bed Mobility Overal bed mobility: Modified Independent             General bed mobility comments: Increased time but no physical assist    Transfers Overall transfer level: Needs assistance Equipment used: None Transfers: Sit to/from Stand Sit to Stand: Contact guard assist           General transfer comment: Steady assist with cues for use of UEs to assist    Ambulation/Gait Ambulation/Gait assistance: Min assist, Contact guard assist Gait Distance (Feet): 200  Feet Assistive device: Rolling walker (2 wheels), None Gait Pattern/deviations: Step-through pattern, Shuffle, Trunk flexed, Wide base of support Gait velocity: decr     General Gait Details: Gait initiated sans AD but pt immediately reaching for furniture to stabilize so RW introduced with marked improvement in stability; cues for posture, position from  [employer]            Wheelchair Mobility     Tilt Bed    Modified Rankin (Stroke Patients Only)       Balance Overall balance assessment: Needs assistance Sitting-balance support: No upper extremity supported, Feet supported Sitting balance-Leahy Scale: Good     Standing balance support: No upper extremity supported Standing balance-Leahy Scale: Fair                               Pertinent Vitals/Pain Pain Assessment Pain Assessment: No/denies pain    Home Living Family/patient expects to be discharged to:: Private residence Living Arrangements: Alone Available Help at Discharge: Available PRN/intermittently;Family (son lives near by) Type of Home: Other(Comment) (Townhouse) Home Access: Stairs to enter   Secretary/administrator of Steps: 1 Alternate Level Stairs-Number of Steps: flight Home Layout: Two level Home Equipment: Cane - single point      Prior Function Prior Level of Function : Independent/Modified Independent             Mobility Comments: Pt states drives and touches on furniture around the home; uses cane outside       Extremity/Trunk Assessment   Upper Extremity Assessment Upper Extremity Assessment:  Generalized weakness    Lower Extremity Assessment Lower Extremity Assessment: Generalized weakness    Cervical / Trunk Assessment Cervical / Trunk Assessment: Normal  Communication   Communication Communication: No apparent difficulties Cueing Techniques: Verbal cues  Cognition Arousal: Alert Behavior During Therapy: WFL for tasks assessed/performed Overall  Cognitive Status: Within Functional Limits for tasks assessed                                          General Comments      Exercises     Assessment/Plan    PT Assessment Patient needs continued PT services  PT Problem List Decreased activity tolerance;Decreased balance;Decreased mobility;Decreased knowledge of use of DME;Decreased strength       PT Treatment Interventions DME instruction;Gait training;Stair training;Functional mobility training;Therapeutic activities;Therapeutic exercise;Balance training;Patient/family education    PT Goals (Current goals can be found in the Care Plan section)  Acute Rehab PT Goals Patient Stated Goal: Regain IND and return home PT Goal Formulation: With patient Time For Goal Achievement: 04/25/23 Potential to Achieve Goals: Good    Frequency Min 1X/week     Co-evaluation               AM-PAC PT 6 Clicks Mobility  Outcome Measure Help needed turning from your back to your side while in a flat bed without using bedrails?: None Help needed moving from lying on your back to sitting on the side of a flat bed without using bedrails?: None Help needed moving to and from a bed to a chair (including a wheelchair)?: A Little Help needed standing up from a chair using your arms (e.g., wheelchair or bedside chair)?: A Little Help needed to walk in hospital room?: A Little Help needed climbing 3-5 steps with a railing? : A Lot 6 Click Score: 19    End of Session Equipment Utilized During Treatment: Gait belt Activity Tolerance: Patient tolerated treatment well Patient left: in chair;with call bell/phone within reach Nurse Communication: Mobility status PT Visit Diagnosis: Difficulty in walking, not elsewhere classified (R26.2)    Time: 8561-8544 PT Time Calculation (min) (ACUTE ONLY): 17 min   Charges:   PT Evaluation $PT Eval Low Complexity: 1 Low   PT General Charges $$ ACUTE PT VISIT: 1 Visit          Katrinka Acton PT Acute Rehabilitation Services Pager [phone] Office [phone]   Lynore Coscia 04/11/2023, 4:05 PM

## 2023-04-12 ENCOUNTER — Telehealth (HOSPITAL_COMMUNITY): Payer: Self-pay | Admitting: Pharmacy Technician

## 2023-04-12 ENCOUNTER — Other Ambulatory Visit (HOSPITAL_COMMUNITY): Payer: Self-pay

## 2023-04-12 DIAGNOSIS — J189 Pneumonia, unspecified organism: Secondary | ICD-10-CM | POA: Diagnosis not present

## 2023-04-12 LAB — BASIC METABOLIC PANEL
Anion gap: 10 (ref 5–15)
Anion gap: 9 (ref 5–15)
Anion gap: 5 (ref 5–15)
Anion gap: 11 (ref 5–15)
Anion gap: 7 (ref 5–15)
BUN: 17 mg/dL (ref 8–23)
BUN: 17 mg/dL (ref 8–23)
BUN: 16 mg/dL (ref 8–23)
BUN: 14 mg/dL (ref 8–23)
BUN: 13 mg/dL (ref 8–23)
CO2: 15 mmol/L — ABNORMAL LOW (ref 22–32)
CO2: 17 mmol/L — ABNORMAL LOW (ref 22–32)
CO2: 19 mmol/L — ABNORMAL LOW (ref 22–32)
CO2: 17 mmol/L — ABNORMAL LOW (ref 22–32)
CO2: 18 mmol/L — ABNORMAL LOW (ref 22–32)
Calcium: 8.1 mg/dL — ABNORMAL LOW (ref 8.9–10.3)
Calcium: 8.2 mg/dL — ABNORMAL LOW (ref 8.9–10.3)
Calcium: 8.1 mg/dL — ABNORMAL LOW (ref 8.9–10.3)
Calcium: 8.2 mg/dL — ABNORMAL LOW (ref 8.9–10.3)
Calcium: 8.2 mg/dL — ABNORMAL LOW (ref 8.9–10.3)
Chloride: 92 mmol/L — ABNORMAL LOW (ref 98–111)
Chloride: 91 mmol/L — ABNORMAL LOW (ref 98–111)
Chloride: 92 mmol/L — ABNORMAL LOW (ref 98–111)
Chloride: 91 mmol/L — ABNORMAL LOW (ref 98–111)
Chloride: 97 mmol/L — ABNORMAL LOW (ref 98–111)
Creatinine, Ser: 1.28 mg/dL — ABNORMAL HIGH (ref 0.61–1.24)
Creatinine, Ser: 1.29 mg/dL — ABNORMAL HIGH (ref 0.61–1.24)
Creatinine, Ser: 1.19 mg/dL (ref 0.61–1.24)
Creatinine, Ser: 1.17 mg/dL (ref 0.61–1.24)
Creatinine, Ser: 1.1 mg/dL (ref 0.61–1.24)
GFR, Estimated: 55 mL/min — ABNORMAL LOW (ref >60)
GFR, Estimated: 55 mL/min — ABNORMAL LOW (ref >60)
GFR, Estimated: >60 mL/min (ref >60)
GFR, Estimated: >60 mL/min (ref >60)
GFR, Estimated: >60 mL/min (ref >60)
Glucose, Bld: 98 mg/dL (ref 70–99)
Glucose, Bld: 95 mg/dL (ref 70–99)
Glucose, Bld: 130 mg/dL — ABNORMAL HIGH (ref 70–99)
Glucose, Bld: 116 mg/dL — ABNORMAL HIGH (ref 70–99)
Glucose, Bld: 112 mg/dL — ABNORMAL HIGH (ref 70–99)
Potassium: 4 mmol/L (ref 3.5–5.1)
Potassium: 3.9 mmol/L (ref 3.5–5.1)
Potassium: 4 mmol/L (ref 3.5–5.1)
Potassium: 4.1 mmol/L (ref 3.5–5.1)
Potassium: 4 mmol/L (ref 3.5–5.1)
Sodium: 117 mmol/L — CL (ref 135–145)
Sodium: 117 mmol/L — CL (ref 135–145)
Sodium: 116 mmol/L — CL (ref 135–145)
Sodium: 119 mmol/L — CL (ref 135–145)
Sodium: 122 mmol/L — ABNORMAL LOW (ref 135–145)

## 2023-04-12 LAB — TSH: TSH: 0.605 u[IU]/mL (ref 0.350–4.500)

## 2023-04-12 LAB — C DIFFICILE QUICK SCREEN W PCR REFLEX
C Diff antigen: NEGATIVE
C Diff interpretation: NOT DETECTED
C Diff toxin: NEGATIVE

## 2023-04-12 LAB — GLUCOSE, CAPILLARY
Glucose-Capillary: 169 mg/dL — ABNORMAL HIGH (ref 70–99)
Glucose-Capillary: 148 mg/dL — ABNORMAL HIGH (ref 70–99)

## 2023-04-12 LAB — CBC
HCT: 31.3 % — ABNORMAL LOW (ref 39.0–52.0)
Hemoglobin: 11.4 g/dL — ABNORMAL LOW (ref 13.0–17.0)
MCH: 31.9 pg (ref 26.0–34.0)
MCHC: 36.4 g/dL — ABNORMAL HIGH (ref 30.0–36.0)
MCV: 87.7 fL (ref 80.0–100.0)
Platelets: 196 10*3/uL (ref 150–400)
RBC: 3.57 MIL/uL — ABNORMAL LOW (ref 4.22–5.81)
RDW: 12 % (ref 11.5–15.5)
WBC: 6.7 10*3/uL (ref 4.0–10.5)
nRBC: 0 % (ref 0.0–0.2)

## 2023-04-12 LAB — CORTISOL: Cortisol, Plasma: 16.4 ug/dL

## 2023-04-12 LAB — T4, FREE: Free T4: 1.47 ng/dL — ABNORMAL HIGH (ref 0.61–1.12)

## 2023-04-12 LAB — STREP PNEUMONIAE URINARY ANTIGEN: Strep Pneumo Urinary Antigen: NEGATIVE

## 2023-04-12 MED ORDER — SODIUM CHLORIDE 1 G PO TABS
1.0000 g | ORAL_TABLET | Freq: Three times a day (TID) | ORAL | Status: DC
  Administered 2023-04-12 (×2): 1 g via ORAL
  Filled 2023-04-12 (×2): qty 1

## 2023-04-12 MED ORDER — SODIUM CHLORIDE 1 G PO TABS
2.0000 g | ORAL_TABLET | Freq: Three times a day (TID) | ORAL | Status: DC
  Administered 2023-04-12 – 2023-04-14 (×7): 2 g via ORAL
  Filled 2023-04-12 (×7): qty 2

## 2023-04-12 NOTE — Telephone Encounter (Signed)
 Patient Product/process development scientist completed.    The patient is insured through [employer].     Ran test claim for Eliquis 5 mg and the current 30 day co-pay is $43.00.   This test claim was processed through [facility]- copay amounts may vary at other pharmacies due to [employer], or as the patient moves through the different stages of their insurance plan.     Roland Earl, CPHT Pharmacy Technician III Certified Patient Advocate [facility] Patient Advocate Team Direct Number: [phone]  Fax: [phone]

## 2023-04-12 NOTE — Progress Notes (Signed)
 Mobility Specialist - Progress Note   04/12/23 0943  Mobility  Activity Ambulated with assistance in hallway  Level of Assistance Contact guard assist, steadying assist  Assistive Device Front wheel walker  Distance Ambulated (ft) 400 ft  Range of Motion/Exercises Active  Activity Response Tolerated well  Mobility Referral Yes  Mobility visit 1 Mobility  Mobility Specialist Start Time (ACUTE ONLY) 0933  Mobility Specialist Stop Time (ACUTE ONLY) 0943  Mobility Specialist Time Calculation (min) (ACUTE ONLY) 10 min   Pt was found on recliner chair and agreeable to ambulate. No complaints with session. At EOS returned to recliner chair with all needs met. Call bell in reach.  Erminio Leos Mobility Specialist

## 2023-04-12 NOTE — TOC Initial Note (Signed)
 Transition of Care [facility]) - Initial/Assessment Note    Patient Details  Name: Taylor Dillon MRN: [id number] Date of Birth: Mar 09, 1941  Transition of [facility]) CM/SW Contact:    Hoy DELENA Bigness, LCSW Phone Number: 04/12/2023, 1:42 PM  Clinical Narrative:                 Pt from home alone. Pt alert and oriented x 4. Met with pt and confirmed plan to return home at discharge. Pt shares his son will be providing transport home at discharge. Pt agreeable to recommendation for RW. RW has been ordered through [employer] and will be delivered to pt's room prior to discharge.   Expected Discharge Plan: Home/Self Care Barriers to Discharge: No Barriers Identified   Patient Goals and CMS Choice Patient states their goals for this hospitalization and ongoing recovery are:: To return home CMS Medicare.gov Compare Post Acute Care list provided to:: Patient Choice offered to / list presented to : Patient [place] ownership interest in [facility]provided to::  (NA)    Expected Discharge Plan and Services In-house Referral: Clinical Social Work Discharge Planning Services: NA Post Acute Care Choice: Durable Medical Equipment Living arrangements for the past 2 months: Single Family Home                 DME Arranged: Walker rolling DME Agency: [employer] Date DME Agency Contacted: 04/12/23 Time DME Agency Contacted: 1341 Representative spoke with at DME Agency: [place]            Prior Living Arrangements/Services Living arrangements for the past 2 months: Single Family Home Lives with:: Self Patient language and need for interpreter reviewed:: Yes Do you feel safe going back to the place where you live?: Yes      Need for Family Participation in Patient Care: Yes (Comment) Care giver support system in place?: No (comment)   Criminal Activity/Legal Involvement Pertinent to Current Situation/Hospitalization: No - Comment as needed  Activities of Daily  Living   ADL Screening (condition at time of admission) Independently performs ADLs?: Yes (appropriate for developmental age) Is the patient deaf or have difficulty hearing?: No Does the patient have difficulty seeing, even when wearing glasses/contacts?: No Does the patient have difficulty concentrating, remembering, or making decisions?: No  Permission Sought/Granted   Permission granted to share information with : No              Emotional Assessment Appearance:: Appears stated age Attitude/Demeanor/Rapport: Engaged Affect (typically observed): Accepting Orientation: : Oriented to Self, Oriented to Place, Oriented to  Time, Oriented to Situation Alcohol / Substance Use: Not Applicable Psych Involvement: No (comment)  Admission diagnosis:  Acute hyponatremia [E87.1] CAP (community acquired pneumonia) [J18.9] Multifocal pneumonia [J18.9] Patient Active Problem List   Diagnosis Date Noted   Generalized weakness 04/11/2023   Hypocalcemia 04/11/2023   Paroxysmal atrial fibrillation (HCC) 04/11/2023   BPH (benign prostatic hyperplasia) 04/11/2023   Essential hypertension 04/11/2023   HLD (hyperlipidemia) 04/11/2023   Anemia of chronic disease 04/11/2023   CAP (community acquired pneumonia) 04/10/2023   CTS (carpal tunnel syndrome) 10/27/2016   SBO (small bowel obstruction) (HCC) 07/04/2016   Hyponatremia 06/18/2014   Renal mass 06/15/2014   PCP:  Cyrena Gwenn SQUIBB, MD (Inactive) Pharmacy:   [facility], [place], [place] - [place] - [address] [place] [address] Phone: [phone] Fax: [phone]  [id number] - [place], [place] - [address] AT [facility] [address]  [address] [place] [address] Phone: [phone] Fax: [phone]  [facility] - [place], [place] - [address] [place] [address] Phone: [phone] Fax:  [phone]     Social Drivers of Health (SDOH) Social History: SDOH Screenings   Food Insecurity: No Food Insecurity (04/10/2023)  Housing: Unknown (04/10/2023)  Transportation Needs: No Transportation Needs (04/10/2023)  Utilities: Not At Risk (04/10/2023)  Social Connections: Unknown (04/10/2023)  Tobacco Use: Low Risk  (04/11/2023)   SDOH Interventions:     Readmission Risk Interventions    04/12/2023   11:54 AM  Readmission Risk Prevention Plan  Transportation Screening Complete  PCP or Specialist Appt within 5-7 Days Complete  Home Care Screening Complete  Medication Review (RN CM) Complete

## 2023-04-12 NOTE — Plan of Care (Signed)
  Problem: Education: Goal: Knowledge of General Education information will improve Description: Including pain rating scale, medication(s)/side effects and non-pharmacologic comfort measures Outcome: Progressing   Problem: Health Behavior/Discharge Planning: Goal: Ability to manage health-related needs will improve Outcome: Progressing   Problem: Clinical Measurements: Goal: Ability to maintain clinical measurements within normal limits will improve Outcome: Progressing   Problem: Activity: Goal: Risk for activity intolerance will decrease Outcome: Progressing   Problem: Elimination: Goal: Will not experience complications related to urinary retention Outcome: Progressing   Problem: Pain Management: Goal: General experience of comfort will improve Outcome: Progressing   Problem: Safety: Goal: Ability to remain free from injury will improve Outcome: Progressing   Problem: Skin Integrity: Goal: Risk for impaired skin integrity will decrease Outcome: Progressing

## 2023-04-12 NOTE — Progress Notes (Signed)
 [facility] Progress Note Patient: Taylor Dillon FMW:[id number] DOB: [birth year] DOA: 04/10/2023  DOS: the patient was seen and examined on 04/12/2023  Brief Hospital Course: PMH of RCC SP left nephrectomy, BPH, HTN, HLD present to the hospital with complaints of cough and shortness of breath.  No episodes of fever.  Patient actually felt that he may have a common cold. Currently being treated for community-acquired pneumonia, newly diagnosed A-fib as well as severe hyponatremia.  Assessment and Plan: Community-acquired pneumonia. No history of COPD or asthma. No sick contact. Chest x-ray shows possibility of multifocal pneumonia. COVID-negative. Currently on IV antibiotic.  Minimal elevation of procalcitonin. Will continue with antibiotic. Add as needed Xopenex . Continue cough suppressant.  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 Serum sodium significantly low. Recently in October serum sodium was normal more than 135. Patient remains asymptomatic thankfully. S Osm 251,Ur Osm 309,Ur NA less than 10. While urine sodium less than 10 is not consistent with SIADH.  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 Continue salt tablets.  Continue frequent sodium monitoring.  If sodium level remains below 120 on 1/14, will consult nephrology and switch to urea  packets.  A-fib. Rate controlled. Likely paroxysmal and new onset. Currently persistent though. Continue Lopressor .  Started new this admission. Discussed with patient in detail.  No significant history of bleeding.  No recent surgeries.  Patient is fairly active at his baseline, swim multiple laps in a day.  Discussed rationale of anticoagulation and options available. Currently agreeable to anticoagulation. Echocardiogram shows preserved EF. No vital abnormality. On Eliquis . Outpatient referral to A-fib clinic will be placed.  BPH. CKD 3B. History of RCC. Patient sees urology  outpatient Dr. Alvaro.  Due to worsening renal function patient was also sent to Dr. Gearline nephrology. 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 Currently serum creatinine is actually better than his October 2024 values of 2.3. Will monitor.  Non-anion gap metabolic acidosis. Etiology not clear. 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 Monitor for now.  Anticipate the patient might correct its own.  Otherwise will initiate bicarb therapy if worsens.  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 H&H stable.  Monitor.  HTN. Home regimen includes amlodipine  10 mg daily, Lasix  as needed, losartan 25 mg daily (dose was recently increased by nephrology). Currently blood pressure is actually adequate and patient is only on 12.5 mg of metoprolol  twice daily for rate control.  Will continue the same. Also on terazosin  twice daily.  Monitor.   Subjective: No nausea no vomiting no fever no chills no chest pain.  Physical Exam: General: in Mild distress, No Rash Cardiovascular: S1 and S2 Present, No Murmur Respiratory: Good respiratory effort, Bilateral Air entry present. No Crackles, improving right more than left bilateral wheezes Abdomen: Bowel Sound present, No tenderness Extremities: No edema Neuro: Alert and oriented x3, no new focal deficit  Data Reviewed: I have Reviewed nursing notes, Vitals, and Lab results. Since last encounter, pertinent lab results CBC and BMP   . I have ordered test including BMP  .   Disposition: Status is: Inpatient Remains inpatient appropriate because: Monitor for improvement in sodium level  SCDs Start: 04/10/23 2137 apixaban  (ELIQUIS ) tablet 5 mg   Family Communication: No one at bedside Level of care: Telemetry continue Vitals:   04/11/23 2052 04/12/23 0100 04/12/23 0541 04/12/23 1356  BP: (!) 141/88  (!) 141/68 (!) 141/73  Pulse: 93  98 61  Resp: 16 14 16    Temp: 98.6 F (37 C)  97.7 F (36.5 C) 97.8 F (36.6 C)  TempSrc: Oral  Oral Oral  SpO2: 92%  91% 96%  Weight:   68 kg    Height:         Author: Yetta Blanch, MD 04/12/2023 7:24 PM  Please look on [email or web address] to find out who is on call.

## 2023-04-12 NOTE — Progress Notes (Signed)
   04/12/23 0300  Provider Notification  Provider Name/Title Dr. Lynwood Kipper, AAP  Date Provider Notified 04/12/23  Time Provider Notified [phone]  Method of Notification Page through secure chat.  Notification Reason Critical Result  Test performed and critical result Na 117 mmol/dl  Date Critical Result Received 04/12/23  Time Critical Result Received 0315  Provider response Evaluate remotely, no new order received. Nephrologist has been follow due to SIADH and Pt has been treated with NaCl tablet. Closely monitor sodium level.   Date of Provider Response 04/12/23  Time of Provider Response 0331   Pt also has complaints of watery diarrhea 3 times on day shift and two times tonight. Pt denies abdominal pain/ nausea/vomiting. Denies black tarry or bloody stools. He is afebrile, stable hemodynamically, alert and fully oriented x 4, no acute distress noted over night. The on-call provider made aware. We will continue to monitor.   Wendi Dash, RN

## 2023-04-12 NOTE — Progress Notes (Signed)
 OT Cancellation Note  Patient Details Name: Taylor Dillon MRN: [id number] DOB: 1940-05-10   Cancelled Treatment:    Reason Eval/Treat Not Completed: OT screened, no needs identified, will sign off. Pt polietly declining participation in OT eval at this time; feels like he is at his baseline and has been performing transfers + toileting tasks mod independently in room. Pt has no DME needs and verbalizes that he will be getting a raised commode seat and continuing to swim daily at the [facility] at discharge.  Redmond Whittley L. Abelina Ketron, OTR/L  04/12/23, 11:53 AM

## 2023-04-12 NOTE — Progress Notes (Signed)
 Pt is alert and fully oriented x 4, stable hemodynamically, Afib on the monitor, HR under controlled. Pt is afebrile. Pt has non-productive coughing, on room air, normal respiratory rate and effort, no acute distress noted overnight. He is able to rest well with no complaints.   Lab notified critical result of Na 118 mmol/dl. MD was already aware. Pt has been treated with Sodium  chloride tablet 2 gm TID. We will continue to monitor.   Wendi Dash, RN

## 2023-04-13 DIAGNOSIS — J189 Pneumonia, unspecified organism: Secondary | ICD-10-CM | POA: Diagnosis not present

## 2023-04-13 LAB — BASIC METABOLIC PANEL
Anion gap: 9 (ref 5–15)
Anion gap: 8 (ref 5–15)
BUN: 12 mg/dL (ref 8–23)
BUN: 23 mg/dL (ref 8–23)
CO2: 17 mmol/L — ABNORMAL LOW (ref 22–32)
CO2: 19 mmol/L — ABNORMAL LOW (ref 22–32)
Calcium: 7.9 mg/dL — ABNORMAL LOW (ref 8.9–10.3)
Calcium: 8.1 mg/dL — ABNORMAL LOW (ref 8.9–10.3)
Chloride: 93 mmol/L — ABNORMAL LOW (ref 98–111)
Chloride: 97 mmol/L — ABNORMAL LOW (ref 98–111)
Creatinine, Ser: 1.13 mg/dL (ref 0.61–1.24)
Creatinine, Ser: 1.33 mg/dL — ABNORMAL HIGH (ref 0.61–1.24)
GFR, Estimated: >60 mL/min (ref >60)
GFR, Estimated: 53 mL/min — ABNORMAL LOW (ref >60)
Glucose, Bld: 101 mg/dL — ABNORMAL HIGH (ref 70–99)
Glucose, Bld: 168 mg/dL — ABNORMAL HIGH (ref 70–99)
Potassium: 3.8 mmol/L (ref 3.5–5.1)
Potassium: 3.6 mmol/L (ref 3.5–5.1)
Sodium: 119 mmol/L — CL (ref 135–145)
Sodium: 124 mmol/L — ABNORMAL LOW (ref 135–145)

## 2023-04-13 LAB — BASIC METABOLIC PANEL WITH GFR
Anion gap: 10 (ref 5–15)
BUN: 29 mg/dL — ABNORMAL HIGH (ref 8–23)
CO2: 17 mmol/L — ABNORMAL LOW (ref 22–32)
Calcium: 8.2 mg/dL — ABNORMAL LOW (ref 8.9–10.3)
Chloride: 93 mmol/L — ABNORMAL LOW (ref 98–111)
Creatinine, Ser: 1.26 mg/dL — ABNORMAL HIGH (ref 0.61–1.24)
GFR, Estimated: 56 mL/min — ABNORMAL LOW
Glucose, Bld: 141 mg/dL — ABNORMAL HIGH (ref 70–99)
Potassium: 4 mmol/L (ref 3.5–5.1)
Sodium: 120 mmol/L — ABNORMAL LOW (ref 135–145)

## 2023-04-13 MED ORDER — AMOXICILLIN-POT CLAVULANATE 875-125 MG PO TABS
1.0000 | ORAL_TABLET | Freq: Two times a day (BID) | ORAL | Status: DC
  Administered 2023-04-13 – 2023-04-16 (×7): 1 via ORAL
  Filled 2023-04-13 (×7): qty 1

## 2023-04-13 MED ORDER — SODIUM BICARBONATE 650 MG PO TABS
650.0000 mg | ORAL_TABLET | Freq: Three times a day (TID) | ORAL | Status: DC
  Administered 2023-04-13 – 2023-04-16 (×10): 650 mg via ORAL
  Filled 2023-04-13 (×10): qty 1

## 2023-04-13 MED ORDER — UREA 15 G PO PACK: 15.0000 g | PACK | Freq: Every day | ORAL | Status: DC

## 2023-04-13 MED ORDER — SODIUM CHLORIDE 0.9 % IV SOLN: INTRAVENOUS | Status: AC

## 2023-04-13 MED ORDER — UREA 15 G PO PACK
15.0000 g | PACK | Freq: Once | ORAL | Status: AC
Start: 2023-04-13 — End: 2023-04-13
  Administered 2023-04-13: 15 g via ORAL
  Filled 2023-04-13: qty 1

## 2023-04-13 MED ORDER — GUAIFENESIN 100 MG/5ML PO LIQD
5.0000 mL | ORAL | Status: DC | PRN
  Administered 2023-04-13: 5 mL via ORAL
  Filled 2023-04-13: qty 10

## 2023-04-13 NOTE — Progress Notes (Signed)
 PHARMACY - ANTICOAGULATION CONSULT NOTE  Pharmacy Consult for apixaban  Indication: atrial fibrillation  No Known Allergies  Patient Measurements: Height: 5' 9 (175.3 cm) Weight: 81.5 kg (179 lb 10.8 oz) IBW/kg (Calculated) : 70.7  Vital Signs: Temp: 98.4 F (36.9 C) (01/14 0508) Temp Source: Oral (01/13 1939) BP: 128/66 (01/14 0508) Pulse Rate: 108 (01/14 0508)  Labs: Recent Labs    04/10/23 1945 04/10/23 2339 04/11/23 0625 04/11/23 1212 04/12/23 0247 04/12/23 0636 04/12/23 1712 04/12/23 2204 04/13/23 0532  HGB 12.0* 11.5*  --   --  11.4*  --   --   --   --   HCT 33.3* 31.4*  --   --  31.3*  --   --   --   --   PLT 185 168  --   --  196  --   --   --   --   LABPROT  --   --  16.2*  --   --   --   --   --   --   INR  --   --  1.3*  --   --   --   --   --   --   CREATININE 1.19 1.27*  --    < > 1.28*   < > 1.17 1.10 1.13  CKTOTAL  --   --  388  --   --   --   --   --   --    < > = values in this interval not displayed.    Estimated Creatinine Clearance: 48.7 mL/min (by C-G formula based on SCr of 1.13 mg/dL).   Medical History: 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 YRS AGO due to ETOH abuse - no Alcohol x 20 yrs    Meds: No anticoagulants PTA  Assessment: Pt is an 89 yoM with PMH of renal cell carcinoma s/p left nephrectomy in 2016. Pt admitted with hyponatremia, new diagnosis of atrial fibrillation. Pharmacy consulted to dose apixaban .   Today, 04/13/23 CBC stable SCr 1.13, weight = 81 kg  Plan:  Continue apixaban  5 mg PO BID Medication counseling provided by pharmacy  Pharmacy to sign off.   Ronal CHRISTELLA Rav, PharmD 04/13/23 7:36 AM

## 2023-04-13 NOTE — Progress Notes (Signed)
 [facility] Progress Note Patient: Taylor Dillon FMW:[id number] DOB: March 03, 1941 DOA: 04/10/2023  DOS: the patient was seen and examined on 04/13/2023  Brief Hospital Course: PMH of RCC SP left nephrectomy, BPH, HTN, HLD present to the hospital with complaints of cough and shortness of breath.  No episodes of fever.  Patient actually felt that he may have a common cold. Currently being treated for community-acquired pneumonia, newly diagnosed A-fib as well as severe hyponatremia.  Assessment and Plan: Community-acquired pneumonia. No history of COPD or asthma. No sick contact. Chest x-ray shows possibility of multifocal pneumonia. COVID-negative. Currently on IV antibiotic.  Minimal elevation of procalcitonin. Will continue with antibiotic.  Switch to p.o. Add as needed Xopenex . Continue cough suppressant.  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 Serum sodium significantly low. Recently in October serum sodium was normal more than 135. Patient remains asymptomatic thankfully. S Osm 251,Ur Osm 309,Ur NA less than 10. While urine sodium less than 10 is not consistent with SIADH.  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 Continue salt tablets.  Continue frequent sodium monitoring. Improving. Monitor.  A-fib. Rate controlled. Likely paroxysmal and new onset. Currently persistent though. Continue Lopressor .  Started new this admission. Discussed with patient in detail.  No significant history of bleeding.  No recent surgeries.  Patient is fairly active at his baseline, swim multiple laps in a day.  Discussed rationale of anticoagulation and options available. Currently agreeable to anticoagulation. Echocardiogram shows preserved EF. No vital abnormality. On Eliquis . Outpatient referral to A-fib clinic will be placed.  BPH. CKD 3B. History of RCC. Patient sees urology outpatient Dr. Alvaro.  Due to worsening renal function patient  was also sent to Dr. Gearline nephrology. 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 Currently serum creatinine is actually better than his October 2024 values of 2.3. Will monitor.  Non-anion gap metabolic acidosis. Etiology not clear. 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 Monitor for now.  Anticipate the patient might correct its own.  Otherwise will initiate bicarb therapy if worsens.  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 H&H stable.  Monitor.  HTN. Home regimen includes amlodipine  10 mg daily, Lasix  as needed, losartan 25 mg daily (dose was recently increased by nephrology). Currently blood pressure is actually adequate and patient is only on 12.5 mg of metoprolol  twice daily for rate control.  Will continue the same. Also on terazosin  twice daily.  Monitor.   Subjective: No acute complaint.  No nausea no vomiting.  Has some chronic ankle pain which is acutely worsening this morning.  Physical Exam: Clear to auscultation.  Wheezing resolved. S1-S2 present Bowel sound present but  Data Reviewed: I have Reviewed nursing notes, Vitals, and Lab results. Reviewed CBC and CMP. Reordered CBC and CMP.  Disposition: Status is: Inpatient Remains inpatient appropriate because: Monitor for improvement in sodium level  SCDs Start: 04/10/23 2137 apixaban  (ELIQUIS ) tablet 5 mg   Family Communication: No one at bedside Level of care: Telemetry continue Vitals:   04/12/23 2124 04/13/23 0508 04/13/23 1236 04/13/23 1959  BP: 125/66 128/66 136/72 139/71  Pulse: 88 (!) 108 (!) 108 86  Resp:  18 18 15   Temp:  98.4 F (36.9 C) 98.7 F (37.1 C) 98.5 F (36.9 C)  TempSrc:   Oral Oral  SpO2:  91% 94% 95%  Weight:  81.5 kg    Height:         Author: Yetta Blanch, MD 04/13/2023 8:07 PM  Please look on [email or web address] to find  out who is on call.

## 2023-04-13 NOTE — Plan of Care (Signed)
  Problem: Education: Goal: Knowledge of General Education information will improve Description: Including pain rating scale, medication(s)/side effects and non-pharmacologic comfort measures Outcome: Progressing   Problem: Clinical Measurements: Goal: Will remain free from infection Outcome: Progressing   Problem: Nutrition: Goal: Adequate nutrition will be maintained Outcome: Progressing   Problem: Elimination: Goal: Will not experience complications related to urinary retention Outcome: Progressing   Problem: Safety: Goal: Ability to remain free from injury will improve Outcome: Progressing   Problem: Skin Integrity: Goal: Risk for impaired skin integrity will decrease Outcome: Progressing

## 2023-04-14 DIAGNOSIS — J189 Pneumonia, unspecified organism: Secondary | ICD-10-CM | POA: Diagnosis not present

## 2023-04-14 LAB — BASIC METABOLIC PANEL
Anion gap: 10 (ref 5–15)
Anion gap: 9 (ref 5–15)
BUN: 21 mg/dL (ref 8–23)
BUN: 21 mg/dL (ref 8–23)
CO2: 18 mmol/L — ABNORMAL LOW (ref 22–32)
CO2: 19 mmol/L — ABNORMAL LOW (ref 22–32)
Calcium: 8.3 mg/dL — ABNORMAL LOW (ref 8.9–10.3)
Calcium: 8.3 mg/dL — ABNORMAL LOW (ref 8.9–10.3)
Chloride: 97 mmol/L — ABNORMAL LOW (ref 98–111)
Chloride: 97 mmol/L — ABNORMAL LOW (ref 98–111)
Creatinine, Ser: 1.31 mg/dL — ABNORMAL HIGH (ref 0.61–1.24)
Creatinine, Ser: 1.38 mg/dL — ABNORMAL HIGH (ref 0.61–1.24)
GFR, Estimated: 54 mL/min — ABNORMAL LOW (ref >60)
GFR, Estimated: 50 mL/min — ABNORMAL LOW (ref >60)
Glucose, Bld: 108 mg/dL — ABNORMAL HIGH (ref 70–99)
Glucose, Bld: 143 mg/dL — ABNORMAL HIGH (ref 70–99)
Potassium: 3.5 mmol/L (ref 3.5–5.1)
Potassium: 3.8 mmol/L (ref 3.5–5.1)
Sodium: 125 mmol/L — ABNORMAL LOW (ref 135–145)
Sodium: 125 mmol/L — ABNORMAL LOW (ref 135–145)

## 2023-04-14 LAB — CBC
HCT: 31.1 % — ABNORMAL LOW (ref 39.0–52.0)
Hemoglobin: 11.1 g/dL — ABNORMAL LOW (ref 13.0–17.0)
MCH: 31.8 pg (ref 26.0–34.0)
MCHC: 35.7 g/dL (ref 30.0–36.0)
MCV: 89.1 fL (ref 80.0–100.0)
Platelets: 224 10*3/uL (ref 150–400)
RBC: 3.49 MIL/uL — ABNORMAL LOW (ref 4.22–5.81)
RDW: 12.6 % (ref 11.5–15.5)
WBC: 9.1 10*3/uL (ref 4.0–10.5)
nRBC: 0 % (ref 0.0–0.2)

## 2023-04-14 MED ORDER — FUROSEMIDE 20 MG PO TABS
20.0000 mg | ORAL_TABLET | Freq: Every day | ORAL | Status: DC
  Administered 2023-04-14 – 2023-04-16 (×3): 20 mg via ORAL
  Filled 2023-04-14 (×3): qty 1

## 2023-04-14 NOTE — Progress Notes (Signed)
 [facility]   NOTE [name] ZOX:[id number]  DOB: 08/10/1940  DOA: 04/10/2023  PCP: Suzzette Eth, MD (Inactive)  04/14/2023,8:20 AM   LOS: 4 days      Code Status: Full code From: Home  current Dispo: Likely home with home health   83 year old male Nephrectomy for renal cell CA 05/2014 Dr. Tova Fresh, prior appendectomy-managed conservatively BPH HTN HLD  Events 1/11 [facility] ED + SOB, generalized weakness 4 to 5 days-sodium 114 chloride 83 CO2 16 BUN/creatinine 19/1.1 LFTs relatively normal  BNP 485 CK3 88 WBC 6.9 hemoglobin 12 platelet 185 INR 1.3 Urine sodium less than 10 urine osmolality 319 serum osmolality 242  procedures 04/10/2023 CXR minimally ill-defined left perihilar opacity atelectasis versus infection, CT chest mild multifocal pneumonia small right and left pleural effusions associated bilateral lower lobe opacities  Plan  Community-acquired pneumonia Initially treated ceftriaxone  azithromycin -currently on Augmentin  and azithromycin  complete 10 days total given multifocal nature, advanced age 7/20 White count never elevated outpatient reimaging carefully Hypoosmolar hyponatremia SIADH--anion gap metabolic acidosis Improved-continues sodium chloride  tab 2 g 3 times daily meal, urea  X1 1/14-monitor trends --- give 1200 cc fluid limitation Will resume Lasix  p.o. as this will help with fluid Rate controlled atrial fibrillation new onset CHADVASC >4 Continues on metoprolol  5 twice daily, Eliquis  5 twice daily and will need outpatient follow-up BPH CKD 3 and history of RCC Outpatient surveillance as above Resume Hytrin  5 twice daily HTN PTA amlodipine  10 losartan 10, resume amlodipine  10.  If blood pressure can tolerate Outpatient follow-up Anemia of chronic kidney disease   DVT prophylaxis: Apixaban   Status is: Inpatient Remains inpatient appropriate because:   Requires correction of sodium close to 130      Subjective: Doing fair-feels a little bit stronger Mild  cough some sputum no fever no chills  Objective + exam Vitals:   04/13/23 1236 04/13/23 1959 04/13/23 2234 04/14/23 0527  BP: 136/72 139/71 134/77 (!) 141/78  Pulse: (!) 108 86 99 94  Resp: 18 15  18   Temp: 98.7 F (37.1 C) 98.5 F (36.9 C)  98.3 F (36.8 C)  TempSrc: Oral Oral    SpO2: 94% 95%  95%  Weight:      Height:       Filed Weights   04/11/23 0211 04/12/23 0541 04/13/23 0508  Weight: 80.4 kg 68 kg 81.5 kg    Examination: EOMI NCAT no focal deficit no icterus no pallor Chest is clear no wheeze rales sound ROM is intact no focal neurological deficit Power is 5/5 S1-S2 seems to be in sinus   Data Reviewed: reviewed   CBC    Component Value Date/Time   WBC 9.1 04/14/2023 0549   RBC 3.49 (L) 04/14/2023 0549   HGB 11.1 (L) 04/14/2023 0549   HCT 31.1 (L) 04/14/2023 0549   PLT 224 04/14/2023 0549   MCV 89.1 04/14/2023 0549   MCH 31.8 04/14/2023 0549   MCHC 35.7 04/14/2023 0549   RDW 12.6 04/14/2023 0549   LYMPHSABS 0.8 04/10/2023 2339   MONOABS 0.8 04/10/2023 2339   EOSABS 0.0 04/10/2023 2339   BASOSABS 0.0 04/10/2023 2339      Latest Ref Rng & Units 04/14/2023    5:49 AM 04/13/2023    9:56 PM 04/13/2023    1:35 PM  CMP  Glucose 70 - 99 mg/dL 811  914  782   BUN 8 - 23 mg/dL 21  23  29    Creatinine 0.61 - 1.24 mg/dL 9.56  2.13  1.26   Sodium 135 - 145 mmol/L 125  124  120   Potassium 3.5 - 5.1 mmol/L 3.5  3.6  4.0   Chloride 98 - 111 mmol/L 97  97  93   CO2 22 - 32 mmol/L 18  19  17    Calcium  8.9 - 10.3 mg/dL 8.3  8.1  8.2     Scheduled Meds:  amoxicillin -clavulanate  1 tablet Oral Q12H   apixaban   5 mg Oral BID   azithromycin   500 mg Oral QHS   benzonatate   100 mg Oral TID   metoprolol  tartrate  12.5 mg Oral BID   simvastatin   20 mg Oral QHS   sodium bicarbonate   650 mg Oral TID   sodium chloride   2 g Oral TID WC   terazosin   5 mg Oral BID   Continuous Infusions:  Time 44  Taylor Kayda Allers, MD  [facility]

## 2023-04-14 NOTE — Progress Notes (Signed)
 Physical Therapy Treatment Patient Details Name: Taylor Dillon MRN: [id number] DOB: November 22, 1940 Today's Date: 04/14/2023   History of Present Illness Pt admitted from home on 04/10/23 with SOB/weakness and dx with CAP, hyponatremia and with hx of renal cell carcinoma s/p L nephrectomy    PT Comments  Pt making good progress.  He has been ambulating in room independently and ambulated 250' with RW and supervision.  Min cues for RW proximity and posture.  Pt reports feels mildly unstable compared to baseline, so continue to recommend RW use.  No PT needs at d/c.     If plan is discharge home, recommend the following: Assistance with cooking/housework;Assist for transportation;Help with stairs or ramp for entrance   Can travel by private vehicle        Equipment Recommendations  Rolling walker (2 wheels)    Recommendations for Other Services       Precautions / Restrictions Precautions Precautions: Fall     Mobility  Bed Mobility               General bed mobility comments: sitting at arrival    Transfers Overall transfer level: Modified independent Equipment used: Rolling walker (2 wheels) Transfers: Sit to/from Stand Sit to Stand: Modified independent (Device/Increase time)           General transfer comment: Supervision during session but has been ambulating in room on his own    Ambulation/Gait Ambulation/Gait assistance: Supervision Gait Distance (Feet): 250 Feet Assistive device: Rolling walker (2 wheels) Gait Pattern/deviations: Trunk flexed Gait velocity: decreased     General Gait Details: leans forward at times but self corrects after initial cues; typically ambulates without RW   Stairs             Wheelchair Mobility     Tilt Bed    Modified Rankin (Stroke Patients Only)       Balance Overall balance assessment: Needs assistance Sitting-balance support: No upper extremity supported, Feet supported Sitting balance-Leahy Scale:  Good     Standing balance support: No upper extremity supported, Bilateral upper extremity supported Standing balance-Leahy Scale: Good Standing balance comment: Could ambulate in room without AD, RW for hallway                            Cognition Arousal: Alert Behavior During Therapy: WFL for tasks assessed/performed Overall Cognitive Status: Within Functional Limits for tasks assessed                                          Exercises      General Comments General comments (skin integrity, edema, etc.): On RA sats 98%      Pertinent Vitals/Pain Pain Assessment Pain Assessment: No/denies pain    Home Living                          Prior Function            PT Goals (current goals can now be found in the care plan section) Progress towards PT goals: Progressing toward goals    Frequency    Min 1X/week      PT Plan      Co-evaluation              AM-PAC PT "6 Clicks" Mobility   Outcome Measure  Help needed turning from your back to your side while in a flat bed without using bedrails?: None Help needed moving from lying on your back to sitting on the side of a flat bed without using bedrails?: None Help needed moving to and from a bed to a chair (including a wheelchair)?: None Help needed standing up from a chair using your arms (e.g., wheelchair or bedside chair)?: None Help needed to walk in hospital room?: None Help needed climbing 3-5 steps with a railing? : A Little 6 Click Score: 23    End of Session Equipment Utilized During Treatment: Gait belt Activity Tolerance: Patient tolerated treatment well Patient left: with call bell/phone within reach;in bed Nurse Communication: Mobility status PT Visit Diagnosis: Difficulty in walking, not elsewhere classified (R26.2)     Time: 0623-7628 PT Time Calculation (min) (ACUTE ONLY): 15 min  Charges:    $Gait Training: 8-22 mins PT General Charges $$  ACUTE PT VISIT: 1 Visit                     Cyd Dowse, PT Acute Rehab Services [facility] [phone]    Carolynn Citrin 04/14/2023, 3:24 PM

## 2023-04-14 NOTE — Plan of Care (Signed)
  Problem: Education: Goal: Knowledge of General Education information will improve Description: Including pain rating scale, medication(s)/side effects and non-pharmacologic comfort measures Outcome: Progressing   Problem: Health Behavior/Discharge Planning: Goal: Ability to manage health-related needs will improve Outcome: Progressing   Problem: Clinical Measurements: Goal: Respiratory complications will improve Outcome: Progressing   Problem: Activity: Goal: Risk for activity intolerance will decrease Outcome: Progressing   Problem: Coping: Goal: Level of anxiety will decrease Outcome: Progressing   Problem: Elimination: Goal: Will not experience complications related to bowel motility Outcome: Progressing Goal: Will not experience complications related to urinary retention Outcome: Progressing   Problem: Pain Management: Goal: General experience of comfort will improve Outcome: Progressing   Problem: Safety: Goal: Ability to remain free from injury will improve Outcome: Progressing

## 2023-04-15 DIAGNOSIS — J189 Pneumonia, unspecified organism: Secondary | ICD-10-CM | POA: Diagnosis not present

## 2023-04-15 LAB — CBC WITH DIFFERENTIAL/PLATELET
Abs Immature Granulocytes: 0.16 10*3/uL — ABNORMAL HIGH (ref 0.00–0.07)
Basophils Absolute: 0.1 10*3/uL (ref 0.0–0.1)
Basophils Relative: 1 %
Eosinophils Absolute: 0.2 10*3/uL (ref 0.0–0.5)
Eosinophils Relative: 3 %
HCT: 29.2 % — ABNORMAL LOW (ref 39.0–52.0)
Hemoglobin: 10.4 g/dL — ABNORMAL LOW (ref 13.0–17.0)
Immature Granulocytes: 2 %
Lymphocytes Relative: 19 %
Lymphs Abs: 1.6 10*3/uL (ref 0.7–4.0)
MCH: 32.1 pg (ref 26.0–34.0)
MCHC: 35.6 g/dL (ref 30.0–36.0)
MCV: 90.1 fL (ref 80.0–100.0)
Monocytes Absolute: 0.9 10*3/uL (ref 0.1–1.0)
Monocytes Relative: 11 %
Neutro Abs: 5.4 10*3/uL (ref 1.7–7.7)
Neutrophils Relative %: 64 %
Platelets: 213 10*3/uL (ref 150–400)
RBC: 3.24 MIL/uL — ABNORMAL LOW (ref 4.22–5.81)
RDW: 12.9 % (ref 11.5–15.5)
WBC: 8.4 10*3/uL (ref 4.0–10.5)
nRBC: 0 % (ref 0.0–0.2)

## 2023-04-15 LAB — SODIUM, URINE, RANDOM: Sodium, Ur: 25 mmol/L

## 2023-04-15 LAB — BASIC METABOLIC PANEL
Anion gap: 10 (ref 5–15)
BUN: 18 mg/dL (ref 8–23)
CO2: 18 mmol/L — ABNORMAL LOW (ref 22–32)
Calcium: 8.1 mg/dL — ABNORMAL LOW (ref 8.9–10.3)
Chloride: 97 mmol/L — ABNORMAL LOW (ref 98–111)
Creatinine, Ser: 0.9 mg/dL (ref 0.61–1.24)
GFR, Estimated: >60 mL/min (ref >60)
Glucose, Bld: 116 mg/dL — ABNORMAL HIGH (ref 70–99)
Potassium: 3.4 mmol/L — ABNORMAL LOW (ref 3.5–5.1)
Sodium: 125 mmol/L — ABNORMAL LOW (ref 135–145)

## 2023-04-15 LAB — OSMOLALITY, URINE: Osmolality, Ur: 347 mosm/kg (ref 300–900)

## 2023-04-15 MED ORDER — UREA 15 G PO PACK
30.0000 g | PACK | Freq: Two times a day (BID) | ORAL | Status: DC
  Administered 2023-04-15 (×2): 30 g via ORAL
  Filled 2023-04-15 (×3): qty 2
  Filled 2023-04-15: qty 1

## 2023-04-15 MED ORDER — METOPROLOL TARTRATE 25 MG PO TABS
25.0000 mg | ORAL_TABLET | Freq: Two times a day (BID) | ORAL | Status: DC
  Administered 2023-04-15 – 2023-04-16 (×3): 25 mg via ORAL
  Filled 2023-04-15 (×3): qty 1

## 2023-04-15 NOTE — Progress Notes (Signed)
Mobility Specialist - Progress Note   04/15/23 1106  Mobility  Activity Ambulated with assistance in hallway  Level of Assistance Standby assist, set-up cues, supervision of patient - no hands on  Assistive Device Front wheel walker  Distance Ambulated (ft) 300 ft  Activity Response Tolerated well  Mobility Referral Yes  Mobility visit 1 Mobility  Mobility Specialist Start Time (ACUTE ONLY) 1055  Mobility Specialist Stop Time (ACUTE ONLY) 1106  Mobility Specialist Time Calculation (min) (ACUTE ONLY) 11 min   Pt received in bed and agreeable to mobility. No complaints during session. Pt to bed after session with all needs met.    [facility]

## 2023-04-15 NOTE — Plan of Care (Signed)
  Problem: Education: Goal: Knowledge of General Education information will improve Description: Including pain rating scale, medication(s)/side effects and non-pharmacologic comfort measures Outcome: Progressing   Problem: Health Behavior/Discharge Planning: Goal: Ability to manage health-related needs will improve Outcome: Progressing   Problem: Clinical Measurements: Goal: Ability to maintain clinical measurements within normal limits will improve Outcome: Progressing Goal: Will remain free from infection Outcome: Progressing Goal: Diagnostic test results will improve Outcome: Progressing Goal: Cardiovascular complication will be avoided Outcome: Progressing   Problem: Nutrition: Goal: Adequate nutrition will be maintained Outcome: Progressing   Problem: Elimination: Goal: Will not experience complications related to bowel motility Outcome: Progressing Goal: Will not experience complications related to urinary retention Outcome: Progressing   Problem: Pain Management: Goal: General experience of comfort will improve Outcome: Progressing   Problem: Safety: Goal: Ability to remain free from injury will improve Outcome: Progressing   Problem: Skin Integrity: Goal: Risk for impaired skin integrity will decrease Outcome: Progressing

## 2023-04-15 NOTE — Progress Notes (Signed)
[facility] ROUNDING   NOTE Taylor Dillon [id number]  DOB: Jan 20, 1941  DOA: 04/10/2023  PCP: Ileana Ladd, MD (Inactive)  04/15/2023,8:46 AM   LOS: 5 days      Code Status: Full code From: Home  current Dispo: Likely home with home health   83 year old male Nephrectomy for renal cell CA 05/2014 Dr. 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 to ambulate  Events 1/11 [facility] ED + SOB, generalized weakness 4 to 5 days-sodium 114 chloride 83 CO2 16 BUN/creatinine 19/1.1 LFTs relatively normal  BNP 485 CK3 88 WBC 6.9 hemoglobin 12 platelet 185 INR 1.3 Urine sodium less than 10 urine osmolality 319 serum osmolality 242  procedures 04/10/2023 CXR minimally ill-defined left perihilar opacity atelectasis versus infection, CT chest mild multifocal pneumonia small right and left pleural effusions associated bilateral lower lobe opacities  Plan  Hypervolemic hyponatremia SIADH with anion gap metabolic acidosis he does have some peripheral edema Sodium seems to have stalled at 125-change salt tabs to urea 30 twice daily 1/16 Repeat urine sodium and urine osmolality and restrict fluid to 1200 cc Giving Lasix 20 daily--- continues on sodium bicarb 650 3 times daily If no better will ask renal an opinion New onset paroxysmal A-fib CHA2DS2-VASc >4 Heart rate is not controlled so increase metoprolol from 12.5-25 twice daily Continues on Eliquis 5 twice daily Will need outpatient A-fib follow-up BPH CKD and history of RCC Continue Hytrin 5 twice daily Requires outpatient surveillance for the cancer HTN Resumed amlodipine 10-losartan 10 discontinue Anemia of chronic disease   DVT prophylaxis: Apixaban  Status is: Inpatient Remains inpatient appropriate because:   Requires correction of sodium close to 130      Subjective:  No real complaints but did feel little bit of fluttering in the chest Nursing reports  heart rate as high as 150 No chest pain  Objective + exam Vitals:   04/14/23 0527 04/14/23 1233 04/14/23 1912 04/15/23 0435  BP: (!) 141/78 111/66 135/67 135/83  Pulse: 94 70 (!) 106 (!) 103  Resp: 18  18 16   Temp: 98.3 F (36.8 C) 98 F (36.7 C) 98.1 F (36.7 C) 98.3 F (36.8 C)  TempSrc:  Oral  Oral  SpO2: 95% 95% 95% 95%  Weight:      Height:       Filed Weights   04/11/23 0211 04/12/23 0541 04/13/23 0508  Weight: 80.4 kg 68 kg 81.5 kg    Examination:  EOMI NCAT no focal deficit no icterus no pallor Chest seems clear no wheeze rales rhonchi S1-S2 no murmur sinus tach rate controlled A-fib predominantly Abdomen soft no rebound no guarding ROM intact Neuro intact moving 4 limbs equally slight swelling lower extremity   Data Reviewed: reviewed   CBC    Component Value Date/Time   WBC 8.4 04/15/2023 0527   RBC 3.24 (L) 04/15/2023 0527   HGB 10.4 (L) 04/15/2023 0527   HCT 29.2 (L) 04/15/2023 0527   PLT 213 04/15/2023 0527   MCV 90.1 04/15/2023 0527   MCH 32.1 04/15/2023 0527   MCHC 35.6 04/15/2023 0527   RDW 12.9 04/15/2023 0527   LYMPHSABS 1.6 04/15/2023 0527   MONOABS 0.9 04/15/2023 0527   EOSABS 0.2 04/15/2023 0527   BASOSABS 0.1 04/15/2023 0527      Latest Ref Rng & Units 04/15/2023    5:27 AM 04/14/2023    2:19 PM 04/14/2023    5:49 AM  CMP  Glucose 70 - 99 mg/dL 161  096  045   BUN 8 - 23 mg/dL 18  21  21    Creatinine 0.61 - 1.24 mg/dL 4.09  8.11  9.14   Sodium 135 - 145 mmol/L 125  125  125   Potassium 3.5 - 5.1 mmol/L 3.4  3.8  3.5   Chloride 98 - 111 mmol/L 97  97  97   CO2 22 - 32 mmol/L 18  19  18    Calcium 8.9 - 10.3 mg/dL 8.1  8.3  8.3     Scheduled Meds:  amoxicillin-clavulanate  1 tablet Oral Q12H   apixaban  5 mg Oral BID   benzonatate  100 mg Oral TID   furosemide  20 mg Oral Daily   metoprolol tartrate  12.5 mg Oral BID   simvastatin  20 mg Oral QHS   sodium bicarbonate  650 mg Oral TID   terazosin  5 mg Oral BID   urea  30 g  Oral BID   Continuous Infusions:  Time 34  Rhetta Mura, MD  [facility]

## 2023-04-15 NOTE — Plan of Care (Signed)
  Problem: Education: Goal: Knowledge of General Education information will improve Description: Including pain rating scale, medication(s)/side effects and non-pharmacologic comfort measures Outcome: Progressing   Problem: Clinical Measurements: Goal: Ability to maintain clinical measurements within normal limits will improve Outcome: Progressing Goal: Will remain free from infection Outcome: Progressing   Problem: Activity: Goal: Risk for activity intolerance will decrease Outcome: Progressing   Problem: Nutrition: Goal: Adequate nutrition will be maintained Outcome: Progressing   Problem: Pain Management: Goal: General experience of comfort will improve Outcome: Progressing   Problem: Safety: Goal: Ability to remain free from injury will improve Outcome: Progressing   Problem: Skin Integrity: Goal: Risk for impaired skin integrity will decrease Outcome: Progressing

## 2023-04-16 DIAGNOSIS — J189 Pneumonia, unspecified organism: Secondary | ICD-10-CM | POA: Diagnosis not present

## 2023-04-16 LAB — CBC WITH DIFFERENTIAL/PLATELET
Abs Immature Granulocytes: 0.11 10*3/uL — ABNORMAL HIGH (ref 0.00–0.07)
Basophils Absolute: 0.1 10*3/uL (ref 0.0–0.1)
Basophils Relative: 1 %
Eosinophils Absolute: 0.2 10*3/uL (ref 0.0–0.5)
Eosinophils Relative: 2 %
HCT: 31.3 % — ABNORMAL LOW (ref 39.0–52.0)
Hemoglobin: 10.7 g/dL — ABNORMAL LOW (ref 13.0–17.0)
Immature Granulocytes: 1 %
Lymphocytes Relative: 13 %
Lymphs Abs: 1.2 10*3/uL (ref 0.7–4.0)
MCH: 31.1 pg (ref 26.0–34.0)
MCHC: 34.2 g/dL (ref 30.0–36.0)
MCV: 91 fL (ref 80.0–100.0)
Monocytes Absolute: 0.7 10*3/uL (ref 0.1–1.0)
Monocytes Relative: 8 %
Neutro Abs: 7.1 10*3/uL (ref 1.7–7.7)
Neutrophils Relative %: 75 %
Platelets: 245 10*3/uL (ref 150–400)
RBC: 3.44 MIL/uL — ABNORMAL LOW (ref 4.22–5.81)
RDW: 13.1 % (ref 11.5–15.5)
WBC: 9.4 10*3/uL (ref 4.0–10.5)
nRBC: 0 % (ref 0.0–0.2)

## 2023-04-16 LAB — BASIC METABOLIC PANEL
Anion gap: 10 (ref 5–15)
BUN: 52 mg/dL — ABNORMAL HIGH (ref 8–23)
CO2: 22 mmol/L (ref 22–32)
Calcium: 8.7 mg/dL — ABNORMAL LOW (ref 8.9–10.3)
Chloride: 98 mmol/L (ref 98–111)
Creatinine, Ser: 1.3 mg/dL — ABNORMAL HIGH (ref 0.61–1.24)
GFR, Estimated: 54 mL/min — ABNORMAL LOW (ref >60)
Glucose, Bld: 124 mg/dL — ABNORMAL HIGH (ref 70–99)
Potassium: 3.5 mmol/L (ref 3.5–5.1)
Sodium: 130 mmol/L — ABNORMAL LOW (ref 135–145)

## 2023-04-16 LAB — MAGNESIUM: Magnesium: 2 mg/dL (ref 1.7–2.4)

## 2023-04-16 MED ORDER — FUROSEMIDE 20 MG PO TABS: 20.0000 mg | ORAL_TABLET | Freq: Every day | ORAL | 0 refills | Status: DC

## 2023-04-16 MED ORDER — AMOXICILLIN-POT CLAVULANATE 875-125 MG PO TABS: 1.0000 | ORAL_TABLET | Freq: Two times a day (BID) | ORAL | 0 refills | Status: AC

## 2023-04-16 MED ORDER — SODIUM BICARBONATE 650 MG PO TABS: 650.0000 mg | ORAL_TABLET | Freq: Three times a day (TID) | ORAL | 0 refills | Status: DC

## 2023-04-16 MED ORDER — UREA 15 G PO PACK
30.0000 g | PACK | Freq: Every day | ORAL | Status: DC
  Administered 2023-04-16: 30 g via ORAL
  Filled 2023-04-16: qty 2

## 2023-04-16 MED ORDER — BENZONATATE 100 MG PO CAPS: 100.0000 mg | ORAL_CAPSULE | Freq: Three times a day (TID) | ORAL | 0 refills | Status: DC

## 2023-04-16 MED ORDER — GUAIFENESIN 100 MG/5ML PO LIQD: 5.0000 mL | ORAL | 0 refills | Status: DC | PRN

## 2023-04-16 MED ORDER — METOPROLOL TARTRATE 25 MG PO TABS: 25.0000 mg | ORAL_TABLET | Freq: Two times a day (BID) | ORAL | 11 refills | Status: DC

## 2023-04-16 MED ORDER — UREA 15 G PO PACK: 30.0000 g | PACK | Freq: Every day | ORAL | 0 refills | Status: DC

## 2023-04-16 MED ORDER — APIXABAN 5 MG PO TABS: 5.0000 mg | ORAL_TABLET | Freq: Two times a day (BID) | ORAL | 11 refills | Status: DC

## 2023-04-16 NOTE — Discharge Summary (Signed)
Physician Discharge Summary  Taylor Dillon NGE:[id number] DOB: March 23, 1941 DOA: 04/10/2023  PCP: Ileana Ladd, MD (Inactive)  Admit date: 04/10/2023 Discharge date: 04/16/2023  Time spent: 47 minutes  Recommendations for Outpatient Follow-up:  Needs Chem-7 CBC 1 week New medications metoprolol, urea packets, Eliquis etc. 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 to ambulate   Events 1/11 [facility] ED + SOB, generalized weakness 4 to 5 days-sodium 114 chloride 83 CO2 16 BUN/creatinine 19/1.1 LFTs relatively normal               BNP 485 CK3 88 WBC 6.9 hemoglobin 12 platelet 185 INR 1.3 Urine sodium less than 10 urine osmolality 319 serum osmolality 242   procedures 04/10/2023 CXR minimally ill-defined left perihilar opacity atelectasis versus infection, CT chest mild multifocal pneumonia small right and left pleural effusions associated bilateral lower lobe opacities 04/11/2023 echocardiogram EF 55-60% mild LVH with  and diastolic parameters indeterminate-hypertrophy of RV apex severely elevated pulmonary arterial pressures   Plan   Hypervolemic hyponatremia SIADH with anion gap metabolic acidosis he does have some peripheral edema Sodium  was difficult to bring up and sodium stalled at 125 decision made  to give small dose Lasix, urea packets twice daily-sodium is now come up to about 130-side effect of urea packets of course are raised and the BUN which is normal so I have given him only 3 more days of this  patient will need repeat labs in  3 to 4 days office visit 1 week to determine if further correction of electrolytes are needed-I have encouraged him not to excessively to try to eat more solute as I feel this was SIADH Pneumonia on admission Initially Rx ceftriaxone azithromycin-completes Augmentin in the outpatient setting 2 more days dosing-needs outpatient chest x-ray in about 1 month to ensure clearance of the same New onset paroxysmal A-fib CHA2DS2-VASc >4  pulmonary arterial hypertension Heart rate is not controlled so increase metoprolol from 12.5-25 twice daily Continues on Eliquis 5 twice daily Will need outpatient A-fib follow-up--- we will try to schedule the same PCP may need to follow and refer if difficult to control but at this time seems well-controlled BPH 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 Kathrynn Running in the outpatient setting and has been seen previously by Dr. Signe Colt Continue Hytrin 5 twice daily Requires outpatient surveillance for the cancer HTN  discontinued amlodipine losartan Lasix was reinitiated because of echocardiogram findings and probably needs referral to cardiology--- will message Ms. Johna Sheriff to make sure that this is arranged Anemia of chronic disease   Discharge Exam: Vitals:   04/15/23 1943 04/16/23 0439  BP: 122/75 121/68  Pulse: 93 74  Resp: 15 18  Temp: 98.4 F (36.9 C) 97.9 F (36.6 C)  SpO2: 97% 92%    Subj on day of d/c  awake coherent no distress EOMI NCAT no focal deficit looks fair eating drinking no chest pain  General Exam on discharge   EOMI NCAT no focal deficit No icterus no pallor Chest is clear ROM is intact CTAB no added sound no wheeze no rales no rhonchi Power is 5/5  Discharge  Instructions   Discharge Instructions     Diet - low sodium heart healthy   Complete by: As directed    Discharge instructions   Complete by: As directed    This hospital stay you were diagnosed with several issues-you came in with a pneumonia and then your heart rate flipped into an irregular rate something called A-fib and finally you had some low sodium during the hospital stay complete Augmentin as an outpatient with 1-2 more days of antibiotics and then stop I would recommend he get a chest x-ray in about a month Your new heart rhythm issue is called A-fib and that puts you at risk for stroke-to prevent the risk of stroke you need to be on a blood thinner something called Eliquis which you take twice a day and you will also be taking a new medication to control your heart rate which also acts as a blood pressure medicine called metoprolol-noticed that we have discontinued your Lasix losartan and amlodipine and these may need to be restarted once you get labs in about a week at your primary physician office Finally you had some low sodium during the hospital stay that was quite difficult to control-we tried salt tablets and ultimately we are discharging on urea tablets-urea tablets can make you look like you are dehydrated I do not think that you are-you will only get 3 of these packets and then you will have follow-up in the outpatient setting with your primary physician in about 3 to 4 days and get repeat labs-they will direct you as to what to do further If you experience high grade fever chills nausea vomiting or shortness of breath please return to the ED   Increase activity slowly   Complete by: As directed       Allergies as of 04/16/2023   No Known Allergies      Medication List     STOP taking these medications    amLODipine 10 MG tablet Commonly known as: NORVASC   losartan 25 MG tablet Commonly known as: COZAAR       TAKE these medications     amoxicillin-clavulanate 875-125 MG tablet Commonly known as: AUGMENTIN Take 1 tablet by mouth every 12 (twelve) hours for 2 days.   apixaban 5 MG Tabs tablet Commonly known as: ELIQUIS Take 1 tablet (5 mg total) by mouth 2 (two) times daily.   benzonatate 100 MG capsule Commonly known as: TESSALON Take 1 capsule (100 mg total) by mouth 3 (three) times daily.   CENTRUM SILVER ADULT 50+ PO Take 1 tablet by mouth daily.   furosemide 20 MG tablet Commonly known as: LASIX Take 1 tablet (20 mg total) by mouth daily.   guaiFENesin 100 MG/5ML liquid Commonly known as: ROBITUSSIN Take 5 mLs by mouth every 4 (four) hours as needed for cough or to loosen phlegm.   metoprolol tartrate 25 MG tablet Commonly known as: LOPRESSOR Take 1 tablet (25 mg total) by mouth 2 (two) times daily.   simvastatin 20 MG tablet Commonly known as: ZOCOR Take 20 mg by mouth at bedtime.   sodium bicarbonate 650 MG tablet Take 1 tablet (650 mg total) by mouth 3 (  three) times daily.   terazosin 5 MG capsule Commonly known as: HYTRIN Take 5 mg by mouth 2 (two) times daily. What changed: Another medication with the same name was removed. Continue taking this medication, and follow the directions you see here.   urea 15 g Pack oral packet Commonly known as: URE-NA Take 30 g by mouth daily.               Durable Medical Equipment  (From admission, onward)           Start     Ordered   04/12/23 1610  For home use only DME Walker rolling  Once       Question Answer Comment  Walker: With 5 Inch Wheels   Patient needs a walker to treat with the following condition Difficulty in walking, not elsewhere classified      04/11/23 1611           No Known Allergies    The results of significant diagnostics from this hospitalization (including imaging, microbiology, ancillary and laboratory) are listed below for reference.    Significant Diagnostic Studies: ECHOCARDIOGRAM  COMPLETE Result Date: 04/11/2023    ECHOCARDIOGRAM REPORT   Patient Name:   ARNET MICHAELSON Date of Exam: 04/11/2023 Medical Rec #:  [id number]        Height:       69.0 in Accession #:    [id number]       Weight:       177.2 lb Date of Birth:  1941/02/19         BSA:          1.963 m Patient Age:    82 years         BP:           136/72 mmHg Patient Gender: M                HR:           98 bpm. Exam Location:  Inpatient Procedure: 2D Echo, Cardiac Doppler and Color Doppler Indications:    I48.91* Unspeicified atrial fibrillation  History:        Patient has no prior history of Echocardiogram examinations.                 Pneumonia.  Sonographer:    Sheralyn Boatman RDCS Referring Phys: [id number] Angie Fava IMPRESSIONS  1. Left ventricular ejection fraction, by estimation, is 55 to 60%. The left ventricle has normal function. The left ventricle has no regional wall motion abnormalities. There is mild left ventricular hypertrophy. Left ventricular diastolic parameters are indeterminate.  2. Hypertrophy at RV apex.. Right ventricular systolic function is moderately reduced. The right ventricular size is mildly enlarged. There is severely elevated pulmonary artery systolic pressure.  3. Left atrial size was severely dilated.  4. Right atrial size was moderately dilated.  5. The mitral valve is normal in structure. Mild mitral valve regurgitation.  6. The aortic valve is tricuspid. Aortic valve regurgitation is not visualized. Aortic valve sclerosis/calcification is present, without any evidence of aortic stenosis.  7. The inferior vena cava is dilated in size with <50% respiratory variability, suggesting right atrial pressure of 15 mmHg. Comparison(s): No prior Echocardiogram. FINDINGS  Left Ventricle: Left ventricular ejection fraction, by estimation, is 55 to 60%. The left ventricle has normal function. The left ventricle has no regional wall motion abnormalities. The left ventricular internal cavity size was normal in  size. There is  mild left ventricular hypertrophy. Left ventricular diastolic parameters are indeterminate. Right Ventricle: Hypertrophy at RV apex. The right ventricular size is mildly enlarged. Right ventricular systolic function is moderately reduced. There is severely elevated pulmonary artery systolic pressure. The tricuspid regurgitant velocity is 3.54 m/s, and with an assumed right atrial pressure of 15 mmHg, the estimated right ventricular systolic pressure is 65.1 mmHg. Left Atrium: Left atrial size was severely dilated. Right Atrium: Right atrial size was moderately dilated. Pericardium: There is no evidence of pericardial effusion. Mitral Valve: The mitral valve is normal in structure. Mild mitral valve regurgitation. Tricuspid Valve: The tricuspid valve is normal in structure. Tricuspid valve regurgitation is mild. Aortic Valve: The aortic valve is tricuspid. Aortic valve regurgitation is not visualized. Aortic valve sclerosis/calcification is present, without any evidence of aortic stenosis. Pulmonic Valve: The pulmonic valve was normal in structure. Pulmonic valve regurgitation is mild. Aorta: The aortic root and ascending aorta are structurally normal, with no evidence of dilitation. Venous: The inferior vena cava is dilated in size with less than 50% respiratory variability, suggesting right atrial pressure of 15 mmHg. IAS/Shunts: No atrial level shunt detected by color flow Doppler.  LEFT VENTRICLE PLAX 2D LVIDd:         4.40 cm LVIDs:         2.70 cm LV PW:         1.20 cm LV IVS:        1.00 cm LVOT diam:     2.50 cm LV SV:         91 LV SV Index:   46 LVOT Area:     4.91 cm  LV Volumes (MOD) LV vol d, MOD A2C: 91.5 ml LV vol d, MOD A4C: 72.1 ml LV vol s, MOD A2C: 30.4 ml LV vol s, MOD A4C: 29.7 ml LV SV MOD A2C:     61.1 ml LV SV MOD A4C:     72.1 ml LV SV MOD BP:      54.3 ml RIGHT VENTRICLE            IVC RV S prime:     9.46 cm/s  IVC diam: 2.90 cm TAPSE (M-mode): 2.3 cm LEFT ATRIUM               Index        RIGHT ATRIUM           Index LA diam:        4.90 cm  2.50 cm/m   RA Area:     22.70 cm LA Vol (A2C):   99.1 ml  50.49 ml/m  RA Volume:   72.90 ml  37.14 ml/m LA Vol (A4C):   86.1 ml  43.87 ml/m LA Biplane Vol: 100.0 ml 50.95 ml/m  AORTIC VALVE             PULMONIC VALVE LVOT Vmax:   119.00 cm/s PR End Diast Vel: 2.46 msec LVOT Vmean:  75.000 cm/s LVOT VTI:    0.186 m  AORTA Ao Root diam: 3.70 cm Ao Asc diam:  3.30 cm MITRAL VALVE                  TRICUSPID VALVE MV Area (PHT): 5.07 cm       TV Peak grad:   23.8 mmHg MV Decel Time: 150 msec       TV Vmax:        2.44 m/s MR Peak grad:    103.2 mmHg  TR Peak grad:   50.1 mmHg MR Mean grad:    77.0 mmHg    TR Vmax:        354.00 cm/s MR Vmax:         508.00 cm/s MR Vmean:        425.0 cm/s   SHUNTS MR PISA:         1.57 cm     Systemic VTI:  0.19 m MR PISA Eff ROA: 10 mm       Systemic Diam: 2.50 cm MR PISA Radius:  0.50 cm MV E velocity: 122.50 cm/s Dietrich Pates MD Electronically signed by Dietrich Pates MD Signature Date/Time: 04/11/2023/1:54:07 PM    Final    CT Chest Wo Contrast Result Date: 04/10/2023 CLINICAL DATA:  Weakness, pneumonia EXAM: CT CHEST WITHOUT CONTRAST TECHNIQUE: Multidetector CT imaging of the chest was performed following the standard protocol without IV contrast. RADIATION DOSE REDUCTION: This exam was performed according to the departmental dose-optimization program which includes automated exposure control, adjustment of the mA and/or kV according to patient size and/or use of iterative reconstruction technique. COMPARISON:  Chest radiograph dated 04/10/2023. FINDINGS: Cardiovascular: The heart is top-normal in size. Trace pericardial effusion. No evidence of thoracic aortic aneurysm. Atherosclerotic calcifications of the aortic arch. Mild three-vessel coronary atherosclerosis. Mediastinum/Nodes: No suspicious mediastinal lymphadenopathy. Visualized thyroid is unremarkable. Lungs/Pleura: Multifocal patchy/ground-glass  opacities in the lungs bilaterally, medial right upper lobe predominant (series 4/image 25), suggesting mild multifocal pneumonia. Small right and trace left pleural effusions. Associated bilateral lower lobe opacities, likely compressive atelectasis. No suspicious pulmonary nodules. No pneumothorax. Upper Abdomen: Visualized upper abdomen is notable for cholelithiasis and vascular calcifications. Suspected left nephrectomy. Musculoskeletal: Visualized osseous structures are within normal limits. IMPRESSION: Mild multifocal pneumonia, as above. Small right and trace left pleural effusions. Associated bilateral lower lobe opacities, likely compressive atelectasis. Aortic Atherosclerosis (ICD10-I70.0). Electronically Signed   By: Charline Bills M.D.   On: 04/10/2023 22:02   DG Chest 2 View Result Date: 04/10/2023 CLINICAL DATA:  Weakness.  Congestion.  Question pneumonia. EXAM: CHEST - 2 VIEW COMPARISON:  10/16/2019 FINDINGS: Heart is upper normal in size. Mediastinal contours are stable. Minimal ill-defined left perihilar opacity. No pulmonary edema, large pleural effusion, or pneumothorax. No acute osseous abnormalities are seen. IMPRESSION: Minimal ill-defined left perihilar opacity may represent atelectasis or infection. Electronically Signed   By: Narda Rutherford M.D.   On: 04/10/2023 19:40    Microbiology: Recent Results (from the past 240 hours)  Resp panel by RT-PCR (RSV, Flu A&B, Covid) Anterior Nasal Swab     Status: None   Collection Time: 04/10/23  7:38 PM   Specimen: Anterior Nasal Swab  Result Value Ref Range Status   SARS Coronavirus 2 by RT PCR NEGATIVE NEGATIVE Final    Comment: (NOTE) SARS-CoV-2 target nucleic acids are NOT DETECTED.  The SARS-CoV-2 RNA is generally detectable in upper respiratory specimens during the acute phase of infection. The lowest concentration of SARS-CoV-2 viral copies this assay can detect is 138 copies/mL. A negative result does not preclude  SARS-Cov-2 infection and should not be used as the sole basis for treatment or other patient management decisions. A negative result may occur with  improper specimen collection/handling, submission of specimen other than nasopharyngeal swab, presence of viral mutation(s) within the areas targeted by this assay, and inadequate number of viral copies(<138 copies/mL). A negative result must be combined with clinical observations, patient history, and epidemiological information. The expected result is Negative.  Fact  Sheet for Patients:  BloggerCourse.com  Fact Sheet for Healthcare Providers:  SeriousBroker.it  This test is no t yet approved or cleared by the [place] FDA and  has been authorized for detection and/or diagnosis of SARS-CoV-2 by FDA under an Emergency Use Authorization (EUA). This EUA will remain  in effect (meaning this test can be used) for the duration of the COVID-19 declaration under Section 564(b)(1) of the Act, 21 U.S.C.section 360bbb-3(b)(1), unless the authorization is terminated  or revoked sooner.       Influenza A by PCR NEGATIVE NEGATIVE Final   Influenza B by PCR NEGATIVE NEGATIVE Final    Comment: (NOTE) The Xpert Xpress SARS-CoV-2/FLU/RSV plus assay is intended as an aid in the diagnosis of influenza from Nasopharyngeal swab specimens and should not be used as a sole basis for treatment. Nasal washings and aspirates are unacceptable for Xpert Xpress SARS-CoV-2/FLU/RSV testing.  Fact Sheet for Patients: BloggerCourse.com  Fact Sheet for Healthcare Providers: SeriousBroker.it  This test is not yet approved or cleared by the [place] FDA and has been authorized for detection and/or diagnosis of SARS-CoV-2 by FDA under an Emergency Use Authorization (EUA). This EUA will remain in effect (meaning this test can be used) for the duration of  the COVID-19 declaration under Section 564(b)(1) of the Act, 21 U.S.C. section 360bbb-3(b)(1), unless the authorization is terminated or revoked.     Resp Syncytial Virus by PCR NEGATIVE NEGATIVE Final    Comment: (NOTE) Fact Sheet for Patients: BloggerCourse.com  Fact Sheet for Healthcare Providers: SeriousBroker.it  This test is not yet approved or cleared by the [place] FDA and has been authorized for detection and/or diagnosis of SARS-CoV-2 by FDA under an Emergency Use Authorization (EUA). This EUA will remain in effect (meaning this test can be used) for the duration of the COVID-19 declaration under Section 564(b)(1) of the Act, 21 U.S.C. section 360bbb-3(b)(1), unless the authorization is terminated or revoked.  Performed at [facility], [address] [place], [place] [address]   C Difficile Quick Screen w PCR reflex     Status: None   Collection Time: 04/12/23 11:14 AM   Specimen: STOOL  Result Value Ref Range Status   C Diff antigen NEGATIVE NEGATIVE Final   C Diff toxin NEGATIVE NEGATIVE Final   C Diff interpretation No C. difficile detected.  Final    Comment: Performed at [facility], [address] [place], [place] [address]     Labs: Basic Metabolic Panel: Recent Labs  Lab 04/10/23 2339 04/11/23 1212 04/13/23 2156 04/14/23 0549 04/14/23 1419 04/15/23 0527 04/16/23 0611  NA 112*   < > 124* 125* 125* 125* 130*  K 4.2   < > 3.6 3.5 3.8 3.4* 3.5  CL 86*   < > 97* 97* 97* 97* 98  CO2 16*   < > 19* 18* 19* 18* 22  GLUCOSE 122*   < > 168* 108* 143* 116* 124*  BUN 19   < > 23 21 21 18  52*  CREATININE 1.27*   < > 1.33* 1.31* 1.38* 0.90 1.30*  CALCIUM 8.0*   < > 8.1* 8.3* 8.3* 8.1* 8.7*  MG 2.1  --   --   --   --   --  2.0  PHOS 2.3*  --   --   --   --   --   --    < > = values in this interval not displayed.   Liver Function  Tests: Recent Labs  Lab  04/10/23 1945 04/10/23 2339  AST 45* 38  ALT 37 35  ALKPHOS 82 77  BILITOT 1.1 0.8  PROT 6.9 6.2*  ALBUMIN 3.7 3.2*   No results for input(s): "LIPASE", "AMYLASE" in the last 168 hours. No results for input(s): "AMMONIA" in the last 168 hours. CBC: Recent Labs  Lab 04/10/23 1945 04/10/23 2339 04/12/23 0247 04/14/23 0549 04/15/23 0527 04/16/23 0611  WBC 6.9 6.0 6.7 9.1 8.4 9.4  NEUTROABS 5.4 4.3  --   --  5.4 7.1  HGB 12.0* 11.5* 11.4* 11.1* 10.4* 10.7*  HCT 33.3* 31.4* 31.3* 31.1* 29.2* 31.3*  MCV 88.1 87.0 87.7 89.1 90.1 91.0  PLT 185 168 196 224 213 245   Cardiac Enzymes: Recent Labs  Lab 04/11/23 0625  CKTOTAL 388   BNP: BNP (last 3 results) Recent Labs    04/11/23 0625  BNP 485.9*    ProBNP (last 3 results) No results for input(s): "PROBNP" in the last 8760 hours.  CBG: Recent Labs  Lab 04/11/23 1208 04/11/23 1617 04/11/23 2106 04/12/23 0756 04/12/23 1227  GLUCAP 128* 114* 109* 169* 148*    Signed:  Rhetta Mura MD   [facility] 04/16/2023, 10:14 AM

## 2023-04-16 NOTE — Plan of Care (Signed)
  Problem: Education: Goal: Knowledge of General Education information will improve Description: Including pain rating scale, medication(s)/side effects and non-pharmacologic comfort measures Outcome: Progressing   Problem: Clinical Measurements: Goal: Ability to maintain clinical measurements within normal limits will improve Outcome: Progressing Goal: Will remain free from infection Outcome: Progressing   Problem: Nutrition: Goal: Adequate nutrition will be maintained Outcome: Progressing   Problem: Pain Management: Goal: General experience of comfort will improve Outcome: Progressing   Problem: Safety: Goal: Ability to remain free from injury will improve Outcome: Progressing

## 2023-04-21 DIAGNOSIS — I2721 Secondary pulmonary arterial hypertension: Secondary | ICD-10-CM | POA: Diagnosis not present

## 2023-04-21 DIAGNOSIS — Z09 Encounter for follow-up examination after completed treatment for conditions other than malignant neoplasm: Secondary | ICD-10-CM | POA: Diagnosis not present

## 2023-04-21 DIAGNOSIS — I480 Paroxysmal atrial fibrillation: Secondary | ICD-10-CM | POA: Diagnosis not present

## 2023-04-21 DIAGNOSIS — R600 Localized edema: Secondary | ICD-10-CM | POA: Diagnosis not present

## 2023-04-21 DIAGNOSIS — J189 Pneumonia, unspecified organism: Secondary | ICD-10-CM | POA: Diagnosis not present

## 2023-04-21 DIAGNOSIS — E871 Hypo-osmolality and hyponatremia: Secondary | ICD-10-CM | POA: Diagnosis not present

## 2023-05-18 DIAGNOSIS — J189 Pneumonia, unspecified organism: Secondary | ICD-10-CM | POA: Diagnosis not present

## 2023-06-16 DIAGNOSIS — L304 Erythema intertrigo: Secondary | ICD-10-CM | POA: Diagnosis not present

## 2023-07-07 NOTE — Progress Notes (Unsigned)
  Cardiology Office Note:  .   Date:  07/07/2023  ID:  Taylor Dillon, DOB [birth year], MRN [id number] PCP: Ileana Ladd, MD (Inactive)  [facility] Providers Cardiologist:  None { Click to update primary MD,subspecialty MD or APP then REFRESH:1}   History of Present Illness: .   No chief complaint on file.   Taylor Dillon is a 83 y.o. male with history of Afib, HTN, RCC, CKD 3a who presents for the evaluation of pAF/pHTN at the request of Jackelyn Poling, DO.    Problem List Persistent Afib -Dx 03/2023 -> PNA 2. Severe pulmonary hypertension  -RVSP 65 mmHG 3. HTN 4. CKD 3a 5. RCC 6. Anemia     ROS: All other ROS reviewed and negative. Pertinent positives noted in the HPI.     Studies Reviewed: Marland Kitchen        TTE 04/11/2023  1. Left ventricular ejection fraction, by estimation, is 55 to 60%. The  left ventricle has normal function. The left ventricle has no regional  wall motion abnormalities. There is mild left ventricular hypertrophy.  Left ventricular diastolic parameters  are indeterminate.   2. Hypertrophy at RV apex.. Right ventricular systolic function is  moderately reduced. The right ventricular size is mildly enlarged. There  is severely elevated pulmonary artery systolic pressure.   3. Left atrial size was severely dilated.   4. Right atrial size was moderately dilated.   5. The mitral valve is normal in structure. Mild mitral valve  regurgitation.   6. The aortic valve is tricuspid. Aortic valve regurgitation is not  visualized. Aortic valve sclerosis/calcification is present, without any  evidence of aortic stenosis.   7. The inferior vena cava is dilated in size with <50% respiratory  variability, suggesting right atrial pressure of 15 mmHg.  Physical Exam:   VS:  There were no vitals taken for this visit.   Wt Readings from Last 3 Encounters:  04/13/23 179 lb 10.8 oz (81.5 kg)  10/27/16 162 lb (73.5 kg)  07/04/16 145 lb (65.8 kg)    GEN: Well  nourished, well developed in no acute distress NECK: No JVD; No carotid bruits CARDIAC: ***RRR, no murmurs, rubs, gallops RESPIRATORY:  Clear to auscultation without rales, wheezing or rhonchi  ABDOMEN: Soft, non-tender, non-distended EXTREMITIES:  No edema; No deformity  ASSESSMENT AND PLAN: .   ***    {Are you ordering a CV Procedure (e.g. stress test, cath, DCCV, TEE, etc)?   Press F2        :[id number]}   Follow-up: No follow-ups on file.  Time Spent with Patient: I have spent a total of *** minutes caring for this patient today face to face, ordering and reviewing labs/tests, reviewing prior records/medical history, examining the patient, establishing an assessment and plan, communicating results/findings to the patient/family, and documenting in the medical record.   Signed, Lenna Gilford. Flora Lipps, MD, [facility]  [facility]  [address], [address] [place], [place] [address] [phone]  8:52 AM

## 2023-07-08 ENCOUNTER — Encounter: Payer: Self-pay | Admitting: Cardiovascular Disease

## 2023-07-08 ENCOUNTER — Ambulatory Visit: Attending: Cardiovascular Disease | Admitting: Cardiovascular Disease

## 2023-07-08 VITALS — BP 130/90 | HR 119 | Ht 70.0 in | Wt 164.0 lb

## 2023-07-08 DIAGNOSIS — R0602 Shortness of breath: Secondary | ICD-10-CM

## 2023-07-08 DIAGNOSIS — I272 Pulmonary hypertension, unspecified: Secondary | ICD-10-CM

## 2023-07-08 DIAGNOSIS — I5032 Chronic diastolic (congestive) heart failure: Secondary | ICD-10-CM

## 2023-07-08 DIAGNOSIS — I4819 Other persistent atrial fibrillation: Secondary | ICD-10-CM

## 2023-07-08 LAB — CBC

## 2023-07-08 MED ORDER — METOPROLOL TARTRATE 100 MG PO TABS: 100.0000 mg | ORAL_TABLET | Freq: Two times a day (BID) | ORAL | 3 refills | Status: DC

## 2023-07-08 NOTE — Patient Instructions (Signed)
 Medication Instructions:  - INCREASE METOPROLOL TARTRATE to 100 MG by mouth TWICE DAILY   *If you need a refill on your cardiac medications before your next appointment, please call your pharmacy*   Lab Work: CBC  BMET    If you have labs (blood work) drawn today and your tests are completely normal, you will receive your results only by: MyChart Message (if you have MyChart) OR A paper copy in the mail If you have any lab test that is abnormal or we need to change your treatment, we will call you to review the results.   Testing/Procedures:     Dear Taylor Dillon  You are scheduled for a Cardioversion on Wednesday, April 16 with Dr. Mayford Knife.  Please arrive at the [facility] (Main Entrance A) at [facility]: [address] [place], [place] [address] at 8:30 AM (This time is 1 hour(s) before your procedure to ensure your preparation).   Free valet parking service is available. You will check in at ADMITTING.   *Please Note: You will receive a call the day before your procedure to confirm the appointment time. That time may have changed from the original time based on the schedule for that day.*    DIET:  Nothing to eat or drink after midnight except a sip of water with medications (see medication instructions below)  MEDICATION INSTRUCTIONS: !!IF ANY NEW MEDICATIONS ARE STARTED AFTER TODAY, PLEASE NOTIFY YOUR PROVIDER AS SOON AS POSSIBLE!!  FYI: Medications such as Semaglutide (Ozempic, [place]), Tirzepatide (Mounjaro, Zepbound), Dulaglutide (Trulicity), etc ("GLP1 agonists") AND Canagliflozin (Invokana), Dapagliflozin (Farxiga), Empagliflozin (Jardiance), Ertugliflozin (Steglatro), Bexagliflozin [employer]) or any combination with one of these drugs such as Invokamet (Canagliflozin/Metformin), Synjardy (Empagliflozin/Metformin), etc ("SGLT2 inhibitors") must be held around the time of a procedure. This is not a comprehensive list of all of these drugs. Please review all of  your medications and talk to your provider if you take any one of these. If you are not sure, ask your provider.     Continue taking your anticoagulant (blood thinner): Apixaban (Eliquis).  You will need to continue this after your procedure until you are told by your provider that it is safe to stop.    LABS:   LABS DRAWN IN CLINIC   FYI:  For your safety, and to allow [place] to monitor your vital signs accurately during the surgery/procedure we request: If you have artificial nails, gel coating, SNS etc, please have those removed prior to your surgery/procedure. Not having the nail coverings /polish removed may result in cancellation or delay of your surgery/procedure.  Your support person will be asked to wait in the waiting room during your procedure.  It is OK to have someone drop you off and come back when you are ready to be discharged.  You cannot drive after the procedure and will need someone to drive you home.  Bring your insurance cards.  *Special Note: Every effort is made to have your procedure done on time. Occasionally there are emergencies that occur at the hospital that may cause delays. Please be patient if a delay does occur.       Follow-Up: At [facility], you and your health needs are our priority.  As part of our continuing mission to provide you with exceptional heart care, we have created designated Provider Care Teams.  These Care Teams include your primary Cardiologist (physician) and Advanced Practice Providers (APPs -  Physician Assistants and Nurse Practitioners) who all work together to  provide you with the care you need, when you need it.  We recommend signing up for the patient portal called "MyChart".  Sign up information is provided on this After Visit Summary.  MyChart is used to connect with patients for Virtual Visits (Telemedicine).  Patients are able to view lab/test results, encounter notes, upcoming appointments, etc.  Non-urgent messages can be  sent to your provider as well.   To learn more about what you can do with MyChart, go to [email or web address].    Your next appointment:   July 19, 2023 AT 9:00AM   The format for your next appointment:   In Person  Provider:   Lennie Odor, MD     Other Instructions

## 2023-07-09 ENCOUNTER — Other Ambulatory Visit: Payer: Self-pay

## 2023-07-09 ENCOUNTER — Telehealth: Payer: Self-pay | Admitting: Cardiovascular Disease

## 2023-07-09 DIAGNOSIS — N179 Acute kidney failure, unspecified: Secondary | ICD-10-CM

## 2023-07-09 LAB — CBC
Hematocrit: 43.9 % (ref 37.5–51.0)
Hemoglobin: 14.1 g/dL (ref 13.0–17.7)
MCH: 31.2 pg (ref 26.6–33.0)
MCHC: 32.1 g/dL (ref 31.5–35.7)
MCV: 97 fL (ref 79–97)
Platelets: 139 10*3/uL — ABNORMAL LOW (ref 150–450)
RBC: 4.52 x10E6/uL (ref 4.14–5.80)
RDW: 12.7 % (ref 11.6–15.4)
WBC: 5.7 10*3/uL (ref 3.4–10.8)

## 2023-07-09 LAB — BASIC METABOLIC PANEL WITH GFR
BUN/Creatinine Ratio: 13 (ref 10–24)
BUN: 28 mg/dL — ABNORMAL HIGH (ref 8–27)
CO2: 21 mmol/L (ref 20–29)
Calcium: 9.4 mg/dL (ref 8.6–10.2)
Chloride: 99 mmol/L (ref 96–106)
Creatinine, Ser: 2.11 mg/dL — ABNORMAL HIGH (ref 0.76–1.27)
Glucose: 125 mg/dL — ABNORMAL HIGH (ref 70–99)
Potassium: 4.7 mmol/L (ref 3.5–5.2)
Sodium: 137 mmol/L (ref 134–144)
eGFR: 30 mL/min/{1.73_m2} — ABNORMAL LOW (ref >59)

## 2023-07-09 NOTE — Telephone Encounter (Signed)
 Called Mr. Clemence.  Has an AKI with creatinine at 2.1.  He will hold Lasix over the weekend.  He will come back for repeat BMP on Monday.  He will not adjust his Eliquis.  Will wait to see what his creatinine is on Monday before you this.  He has a cardioversion coming up.  Denies any chest pains or trouble breathing.  Overall doing well.  Follow-up echo lab work on Monday.  Gerri Spore T. Flora Lipps, MD, [facility]  [facility]  [address] [place], [place] [address] [phone]  10:34 AM

## 2023-07-12 DIAGNOSIS — N179 Acute kidney failure, unspecified: Secondary | ICD-10-CM | POA: Diagnosis not present

## 2023-07-12 LAB — BASIC METABOLIC PANEL WITH GFR
BUN/Creatinine Ratio: 12 (ref 10–24)
BUN: 23 mg/dL (ref 8–27)
CO2: 22 mmol/L (ref 20–29)
Calcium: 9.1 mg/dL (ref 8.6–10.2)
Chloride: 100 mmol/L (ref 96–106)
Creatinine, Ser: 1.94 mg/dL — ABNORMAL HIGH (ref 0.76–1.27)
Glucose: 110 mg/dL — ABNORMAL HIGH (ref 70–99)
Potassium: 4.8 mmol/L (ref 3.5–5.2)
Sodium: 133 mmol/L — ABNORMAL LOW (ref 134–144)
eGFR: 34 mL/min/{1.73_m2} — ABNORMAL LOW (ref >59)

## 2023-07-13 ENCOUNTER — Other Ambulatory Visit: Payer: Self-pay | Admitting: Cardiovascular Disease

## 2023-07-13 ENCOUNTER — Encounter: Payer: Self-pay | Admitting: Cardiovascular Disease

## 2023-07-13 MED ORDER — APIXABAN 2.5 MG PO TABS: 2.5000 mg | ORAL_TABLET | Freq: Two times a day (BID) | ORAL | 1 refills | Status: DC

## 2023-07-13 NOTE — Progress Notes (Signed)
 Eliquis 2.5 mg BID ordered.   Melodee Spruce T. Rolm Clos, MD, [facility]  [facility]  [address], [address] [place], [place] [address] [phone]  12:48 PM

## 2023-07-13 NOTE — H&P (View-Only) (Signed)
 Eliquis 2.5 mg BID ordered.   Melodee Spruce T. Rolm Clos, MD, [facility]  [facility]  [address], [address] [place], [place] [address] [phone]  12:48 PM

## 2023-07-13 NOTE — Progress Notes (Signed)
 Pt called for pre procedure instructions. Arrival time 0800 NPO after midnight explained Instructed to take am meds with sip of water and confirmed blood thinner consistency, reports that he has not missed any doses of Eliquis. Instructed pt need for ride home tomorrow and have responsible adult with them for 24 hrs post procedure.

## 2023-07-14 ENCOUNTER — Ambulatory Visit (HOSPITAL_COMMUNITY)
Admission: RE | Admit: 2023-07-14 | Discharge: 2023-07-14 | Disposition: A | Discharge status: Home / Self Care | Attending: Cardiology | Admitting: Cardiology

## 2023-07-14 ENCOUNTER — Ambulatory Visit (HOSPITAL_COMMUNITY): Admitting: Certified Registered Nurse Anesthetist

## 2023-07-14 ENCOUNTER — Telehealth: Payer: Self-pay | Admitting: Cardiovascular Disease

## 2023-07-14 ENCOUNTER — Encounter (HOSPITAL_COMMUNITY)
Admission: RE | Disposition: A | Discharge status: Home / Self Care | Payer: Self-pay | Source: Home / Self Care | Attending: Cardiology

## 2023-07-14 ENCOUNTER — Other Ambulatory Visit: Payer: Self-pay

## 2023-07-14 ENCOUNTER — Other Ambulatory Visit: Payer: Self-pay | Admitting: *Deleted

## 2023-07-14 ENCOUNTER — Ambulatory Visit (HOSPITAL_BASED_OUTPATIENT_CLINIC_OR_DEPARTMENT_OTHER): Admitting: Certified Registered Nurse Anesthetist

## 2023-07-14 DIAGNOSIS — I4819 Other persistent atrial fibrillation: Secondary | ICD-10-CM

## 2023-07-14 DIAGNOSIS — I4891 Unspecified atrial fibrillation: Secondary | ICD-10-CM

## 2023-07-14 DIAGNOSIS — I10 Essential (primary) hypertension: Secondary | ICD-10-CM | POA: Diagnosis not present

## 2023-07-14 DIAGNOSIS — Z79899 Other long term (current) drug therapy: Secondary | ICD-10-CM | POA: Diagnosis not present

## 2023-07-14 DIAGNOSIS — I480 Paroxysmal atrial fibrillation: Secondary | ICD-10-CM

## 2023-07-14 HISTORY — PX: CARDIOVERSION: EP1203

## 2023-07-14 MED ORDER — PROPOFOL 10 MG/ML IV BOLUS
INTRAVENOUS | Status: DC | PRN
  Administered 2023-07-14: 40 mg via INTRAVENOUS
  Administered 2023-07-14: 10 mg via INTRAVENOUS

## 2023-07-14 MED ORDER — APIXABAN 2.5 MG PO TABS: 2.5000 mg | ORAL_TABLET | Freq: Two times a day (BID) | ORAL | 1 refills | Status: DC

## 2023-07-14 SURGICAL SUPPLY — 1 items: PAD DEFIB RADIO PHYSIO CONN (PAD) ×1 IMPLANT

## 2023-07-14 NOTE — Telephone Encounter (Signed)
 Eliquis 2.5mg  refill request received. Patient is 83 years old, weight-72.6kg, Crea-1.94 on 07/12/23, Diagnosis-Afib, and last seen by Dr. Rolm Clos on 07/08/23. Dose is appropriate based on dosing criteria.   Refill was sent on 07/14/23 for 30 day supply with a refill.

## 2023-07-14 NOTE — Transfer of Care (Signed)
 Immediate Anesthesia Transfer of Care Note  Patient: Taylor Dillon  Procedure(s) Performed: CARDIOVERSION  Patient Location: PACU  Anesthesia Type:General  Level of Consciousness: awake, alert , and oriented  Airway & Oxygen Therapy: Patient Spontanous Breathing  Post-op Assessment: Report given to RN and Post -op Vital signs reviewed and stable  Post vital signs: Reviewed and stable  Last Vitals:  Vitals Value Taken Time  BP 121/71 07/14/23 0925  Temp    Pulse 78 07/14/23 0929  Resp 11 07/14/23 0929  SpO2 94 % 07/14/23 0929  Vitals shown include unfiled device data.  Last Pain:  Vitals:   07/14/23 0920  TempSrc:   PainSc: 0-No pain         Complications: No notable events documented.

## 2023-07-14 NOTE — Telephone Encounter (Signed)
 Eliquis 2.5mg  refill request received. Patient is 83 years old, weight-72.6kg, Crea-1.94 on 07/12/23, Diagnosis-Afib, and last seen by Dr. Rolm Clos on 07/08/23. Dose is appropriate based on dosing criteria.   Last refill sent yesterday for 30 day supply and 1 refill this request is for a 90 day supply. Will send.

## 2023-07-14 NOTE — Interval H&P Note (Signed)
 History and Physical Interval Note:  07/14/2023 8:30 AM  Taylor Dillon  has presented today for surgery, with the diagnosis of AFIB.  The various methods of treatment have been discussed with the patient and family. After consideration of risks, benefits and other options for treatment, the patient has consented to  Procedure(s): CARDIOVERSION (N/A) as a surgical intervention.  The patient's history has been reviewed, patient examined, no change in status, stable for surgery.  I have reviewed the patient's chart and labs.  Questions were answered to the patient's satisfaction.     Gaylyn Keas

## 2023-07-14 NOTE — Anesthesia Preprocedure Evaluation (Signed)
 Anesthesia Evaluation  Patient identified by MRN, date of birth, ID band Patient awake    Reviewed: Allergy & Precautions, NPO status , Patient's Chart, lab work & pertinent test results  History of Anesthesia Complications Negative for: history of anesthetic complications  Airway Mallampati: III  TM Distance: >3 FB Neck ROM: Full    Dental  (+) Dental Advisory Given   Pulmonary neg shortness of breath, neg sleep apnea, neg COPD, neg recent URI   breath sounds clear to auscultation       Cardiovascular hypertension, Pt. on medications and Pt. on home beta blockers + dysrhythmias Atrial Fibrillation  Rhythm:Irregular   1. Left ventricular ejection fraction, by estimation, is 55 to 60%. The  left ventricle has normal function. The left ventricle has no regional  wall motion abnormalities. There is mild left ventricular hypertrophy.  Left ventricular diastolic parameters  are indeterminate.   2. Hypertrophy at RV apex.. Right ventricular systolic function is  moderately reduced. The right ventricular size is mildly enlarged. There  is severely elevated pulmonary artery systolic pressure.   3. Left atrial size was severely dilated.   4. Right atrial size was moderately dilated.   5. The mitral valve is normal in structure. Mild mitral valve  regurgitation.   6. The aortic valve is tricuspid. Aortic valve regurgitation is not  visualized. Aortic valve sclerosis/calcification is present, without any  evidence of aortic stenosis.   7. The inferior vena cava is dilated in size with <50% respiratory  variability, suggesting right atrial pressure of 15 mmHg.      Neuro/Psych  Neuromuscular disease    GI/Hepatic negative GI ROS, Neg liver ROS,,,  Endo/Other  negative endocrine ROS    Renal/GU negative Renal ROS     Musculoskeletal negative musculoskeletal ROS (+)    Abdominal   Peds  Hematology  (+) Blood dyscrasia Lab  Results      Component                Value               Date                      WBC                      5.7                 07/08/2023                HGB                      14.1                07/08/2023                HCT                      43.9                07/08/2023                MCV                      97                  07/08/2023  PLT                      139 (L)             07/08/2023             eliquis   Anesthesia Other Findings   Reproductive/Obstetrics                              Anesthesia Physical Anesthesia Plan  ASA: 3  Anesthesia Plan: General   Post-op Pain Management: Minimal or no pain anticipated   Induction: Intravenous  PONV Risk Score and Plan: 2 and Treatment may vary due to age or medical condition  Airway Management Planned: Nasal Cannula, Natural Airway and Simple Face Mask  Additional Equipment: None  Intra-op Plan:   Post-operative Plan:   Informed Consent: I have reviewed the patients History and Physical, chart, labs and discussed the procedure including the risks, benefits and alternatives for the proposed anesthesia with the patient or authorized representative who has indicated his/her understanding and acceptance.     Dental advisory given  Plan Discussed with: CRNA  Anesthesia Plan Comments:          Anesthesia Quick Evaluation

## 2023-07-14 NOTE — Telephone Encounter (Signed)
*  STAT* If patient is at the pharmacy, call can be transferred to refill team.   1. Which medications need to be refilled? (please list name of each medication and dose if known) apixaban (ELIQUIS) 2.5 MG TABS tablet   2. Which pharmacy/location (including street and city if local pharmacy) is medication to be sent to? [id number] [place], [place] - [address] Phone: [phone]  Fax: [phone]     3. Do they need a 30 day or 90 day supply? 90 Per Pharmacy pt went in on the website and accidentally put he was not taking now they need new script sent over to be able to refill it.

## 2023-07-14 NOTE — Anesthesia Postprocedure Evaluation (Signed)
 Anesthesia Post Note  Patient: Taylor Dillon  Procedure(s) Performed: CARDIOVERSION     Patient location during evaluation: Cath Lab Anesthesia Type: General Level of consciousness: awake and alert Pain management: pain level controlled Vital Signs Assessment: post-procedure vital signs reviewed and stable Respiratory status: spontaneous breathing, nonlabored ventilation and respiratory function stable Cardiovascular status: blood pressure returned to baseline and stable Postop Assessment: no apparent nausea or vomiting Anesthetic complications: no   No notable events documented.  Last Vitals:  Vitals:   07/14/23 0940 07/14/23 0945  BP: (!) 117/97 (!) 128/90  Pulse: 89 78  Resp: (!) 28 11  Temp:    SpO2: 91% 92%    Last Pain:  Vitals:   07/14/23 0920  TempSrc:   PainSc: 0-No pain                 Atlantis Delong

## 2023-07-14 NOTE — CV Procedure (Signed)
    Electrical Cardioversion Procedure Note DEZMOND DOWNIE [id number] September 14, 1940  Procedure: Electrical Cardioversion Indications:  Atrial Fibrillation  Time Out: Verified patient identification, verified procedure,medications/allergies/relevent history reviewed, required imaging and test results available.  Performed  Procedure Details  During this procedure the patient is administered a total of Propofol 50 mg  to achieve and maintain moderate conscious sedation.  The patient's heart rate, blood pressure, and oxygen saturation are monitored continuously during the procedure. The period of conscious sedation is 3 minutes, of which I was present face-to-face 100% of this time. Omar Bibber, CRNA is an independent, trained observer who assisted in the monitoring of the patient's level of consciousness.     Cardioversion was done with synchronized biphasic defibrillation with AP pads with 200watts.  The patient converted to normal sinus rhythm but then had increased atrial ectopy and went back into atrial fibrillation with RVR. Cardioversion was done with synchronized biphasic defibrillation with AP pads with 300watts.  The patient converted to normal sinus rhythm . The patient tolerated the procedure well   IMPRESSION:  Successful cardioversion of atrial fibrillation    Jaynee Winters 07/14/2023, 8:31 AM

## 2023-07-15 ENCOUNTER — Encounter (HOSPITAL_COMMUNITY): Payer: Self-pay | Admitting: Cardiology

## 2023-07-18 NOTE — Progress Notes (Unsigned)
 Cardiology Office Note:  .   Date:  07/19/2023  ID:  Ezella Holly, DOB 19-Jul-1940, MRN [id number] PCP: Mordechai April, DO  [facility] Providers Cardiologist:  None { History of Present Illness: .    Chief Complaint  Patient presents with   Follow-up    Taylor Dillon is a 83 y.o. male with history of persistent Afib, pHTN, HTN, CKD 3a who presents for follow-up.    History of Present Illness   Taylor Dillon is an 83 year old male with CKD stage 3A, hyperlipidemia, HFpEF, pHTN, and persistent atrial fibrillation who presents for follow-up after early recurrence of AFib post-cardioversion.  Initially evaluated for new onset atrial fibrillation after a bout of pneumonia in January 2025, he experienced an early recurrence of atrial fibrillation following a cardioversion procedure. Despite the cardioversion, he returned to atrial fibrillation shortly after the procedure.  He feels generally tired and exhausted, with some shortness of breath. No chest pain is reported. His energy levels have been low since recovering from pneumonia, which he describes as having a significant impact on his energy compared to before the illness.  He is currently on metoprolol  tartrate 100 mg twice daily, which has helped control his heart rate. He is also taking Eliquis  2.5 mg twice daily due to elevated serum creatinine levels. He was previously on furosemide  (Lasix ) but has held off on it recently, using it only as needed. He reports minimal swelling, only noticing it slightly when wearing tight socks.  No chest pain and minimal swelling. He denies feeling his heart racing or beating fast.          Problem List Persistent Afib -Dx 03/2023 -> PNA -DCCV 07/14/2023 2. HFpEF -Severe pulmonary hypertension  -RVSP 65 mmHG 3. HTN 4. CKD 3a 5. 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 Anemia  7. HLD -T chol 152, HDL 63, LDL 77, TG 58    ROS: All other ROS reviewed and negative. Pertinent positives  noted in the HPI.     Studies Reviewed: Aaron Aas   EKG Interpretation Date/Time:  Monday July 19 2023 09:11:36 EDT Ventricular Rate:  84 PR Interval:    QRS Duration:  90 QT Interval:  390 QTC Calculation: 460 R Axis:   -5  Text Interpretation: Atrial fibrillation Nonspecific T wave abnormality Prolonged QT Confirmed by Jackquelyn Mass [phone]) on 07/19/2023 9:12:36 AM   TTE 04/11/2023  1. Left ventricular ejection fraction, by estimation, is 55 to 60%. The  left ventricle has normal function. The left ventricle has no regional  wall motion abnormalities. There is mild left ventricular hypertrophy.  Left ventricular diastolic parameters  are indeterminate.   2. Hypertrophy at RV apex.. Right ventricular systolic function is  moderately reduced. The right ventricular size is mildly enlarged. There  is severely elevated pulmonary artery systolic pressure.   3. Left atrial size was severely dilated.   4. Right atrial size was moderately dilated.   5. The mitral valve is normal in structure. Mild mitral valve  regurgitation.   6. The aortic valve is tricuspid. Aortic valve regurgitation is not  visualized. Aortic valve sclerosis/calcification is present, without any  evidence of aortic stenosis.   7. The inferior vena cava is dilated in size with <50% respiratory  variability, suggesting right atrial pressure of 15 mmHg.  Physical Exam:   VS:  BP 128/88 (BP Location: Left Arm, Patient Position: Sitting)   Pulse 95   Ht 5\' 9"  (1.753 m)   Wt 169  lb 9.6 oz (76.9 kg)   SpO2 95%   BMI 25.05 kg/m    Wt Readings from Last 3 Encounters:  07/19/23 169 lb 9.6 oz (76.9 kg)  07/14/23 160 lb (72.6 kg)  07/08/23 164 lb (74.4 kg)    GEN: Well nourished, well developed in no acute distress NECK: No JVD; No carotid bruits CARDIAC: irregular rhythm, no murmurs, rubs, gallops RESPIRATORY:  Clear to auscultation without rales, wheezing or rhonchi  ABDOMEN: Soft, non-tender, non-distended EXTREMITIES:   trace edema ASSESSMENT AND PLAN: .   Assessment and Plan    Persistent Atrial Fibrillation Persistent atrial fibrillation with early recurrence post-cardioversion. Heart rate controlled with metoprolol . Fatigue and mild dyspnea present. Amiodarone  chosen for rhythm control due to short-lived sinus rhythm post-procedure. Risks considered with CKD, but amiodarone  deemed optimal. Cardioversion planned post-amiodarone  loading. Ablation or pacemaker considered if unsuccessful. - Initiate amiodarone  200 mg twice daily for 7 days, then 200 mg daily indefinitely. - Schedule cardioversion in 2 weeks. - Continue metoprolol  tartrate 100 mg twice daily. - Continue Eliquis  2.5 mg twice daily. - Consider referral to an electrophysiologist for potential ablation if rhythm control is not achieved. - Discuss potential use of CardiaMobile for monitoring AFib recurrence.  Pulmonary Hypertension and HFpEF Pulmonary hypertension and HFpEF present. No current volume overload. Blood pressure well controlled. - Continue furosemide  20 mg daily as needed for fluid management. -  I would like to repeat his echo once in NSR to see if this has improved. Initial echo when diagnosed with PNA and acutely ill.  - Euvolemic today.   Hypertension Blood pressure well controlled with metoprolol . - Continue metoprolol  tartrate 100 mg twice daily.  Chronic Kidney Disease Stage 3A AKI CKD stage 3A limits rhythm control medication options. - Order basic metabolic panel (BMP) to assess kidney function. - Monitor kidney function regularly.          Informed Consent   Shared Decision Making/Informed Consent The risks (stroke, cardiac arrhythmias rarely resulting in the need for a temporary or permanent pacemaker, skin irritation or burns and complications associated with conscious sedation including aspiration, arrhythmia, respiratory failure and death), benefits (restoration of normal sinus rhythm) and alternatives of a  direct current cardioversion were explained in detail to Taylor Dillon and he agrees to proceed.        Follow-up: Return in about 2 weeks (around 08/02/2023).   Signed, Gigi Kyle. Rolm Clos, MD, [facility]  [facility]  [address], [address] [place], [place] [address] [phone]  10:04 AM

## 2023-07-18 NOTE — H&P (View-Only) (Signed)
 Cardiology Office Note:  .   Date:  07/19/2023  ID:  Taylor Dillon, DOB 01/02/1941, MRN [id number] PCP: Mordechai April, DO  [facility] Providers Cardiologist:  None { History of Present Illness: .    Chief Complaint  Patient presents with   Follow-up    Taylor Dillon is a 83 y.o. male with history of persistent Afib, pHTN, HTN, CKD 3a who presents for follow-up.    History of Present Illness   Taylor Dillon is an 83 year old male with CKD stage 3A, hyperlipidemia, HFpEF, pHTN, and persistent atrial fibrillation who presents for follow-up after early recurrence of AFib post-cardioversion.  Initially evaluated for new onset atrial fibrillation after a bout of pneumonia in January 2025, he experienced an early recurrence of atrial fibrillation following a cardioversion procedure. Despite the cardioversion, he returned to atrial fibrillation shortly after the procedure.  He feels generally tired and exhausted, with some shortness of breath. No chest pain is reported. His energy levels have been low since recovering from pneumonia, which he describes as having a significant impact on his energy compared to before the illness.  He is currently on metoprolol  tartrate 100 mg twice daily, which has helped control his heart rate. He is also taking Eliquis  2.5 mg twice daily due to elevated serum creatinine levels. He was previously on furosemide  (Lasix ) but has held off on it recently, using it only as needed. He reports minimal swelling, only noticing it slightly when wearing tight socks.  No chest pain and minimal swelling. He denies feeling his heart racing or beating fast.          Problem List Persistent Afib -Dx 03/2023 -> PNA -DCCV 07/14/2023 2. HFpEF -Severe pulmonary hypertension  -RVSP 65 mmHG 3. HTN 4. CKD 3a 5. 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 Anemia  7. HLD -T chol 152, HDL 63, LDL 77, TG 58    ROS: All other ROS reviewed and negative. Pertinent positives  noted in the HPI.     Studies Reviewed: Aaron Aas   EKG Interpretation Date/Time:  Monday July 19 2023 09:11:36 EDT Ventricular Rate:  84 PR Interval:    QRS Duration:  90 QT Interval:  390 QTC Calculation: 460 R Axis:   -5  Text Interpretation: Atrial fibrillation Nonspecific T wave abnormality Prolonged QT Confirmed by Jackquelyn Mass [phone]) on 07/19/2023 9:12:36 AM   TTE 04/11/2023  1. Left ventricular ejection fraction, by estimation, is 55 to 60%. The  left ventricle has normal function. The left ventricle has no regional  wall motion abnormalities. There is mild left ventricular hypertrophy.  Left ventricular diastolic parameters  are indeterminate.   2. Hypertrophy at RV apex.. Right ventricular systolic function is  moderately reduced. The right ventricular size is mildly enlarged. There  is severely elevated pulmonary artery systolic pressure.   3. Left atrial size was severely dilated.   4. Right atrial size was moderately dilated.   5. The mitral valve is normal in structure. Mild mitral valve  regurgitation.   6. The aortic valve is tricuspid. Aortic valve regurgitation is not  visualized. Aortic valve sclerosis/calcification is present, without any  evidence of aortic stenosis.   7. The inferior vena cava is dilated in size with <50% respiratory  variability, suggesting right atrial pressure of 15 mmHg.  Physical Exam:   VS:  BP 128/88 (BP Location: Left Arm, Patient Position: Sitting)   Pulse 95   Ht 5\' 9"  (1.753 m)   Wt 169  lb 9.6 oz (76.9 kg)   SpO2 95%   BMI 25.05 kg/m    Wt Readings from Last 3 Encounters:  07/19/23 169 lb 9.6 oz (76.9 kg)  07/14/23 160 lb (72.6 kg)  07/08/23 164 lb (74.4 kg)    GEN: Well nourished, well developed in no acute distress NECK: No JVD; No carotid bruits CARDIAC: irregular rhythm, no murmurs, rubs, gallops RESPIRATORY:  Clear to auscultation without rales, wheezing or rhonchi  ABDOMEN: Soft, non-tender, non-distended EXTREMITIES:   trace edema ASSESSMENT AND PLAN: .   Assessment and Plan    Persistent Atrial Fibrillation Persistent atrial fibrillation with early recurrence post-cardioversion. Heart rate controlled with metoprolol . Fatigue and mild dyspnea present. Amiodarone  chosen for rhythm control due to short-lived sinus rhythm post-procedure. Risks considered with CKD, but amiodarone  deemed optimal. Cardioversion planned post-amiodarone  loading. Ablation or pacemaker considered if unsuccessful. - Initiate amiodarone  200 mg twice daily for 7 days, then 200 mg daily indefinitely. - Schedule cardioversion in 2 weeks. - Continue metoprolol  tartrate 100 mg twice daily. - Continue Eliquis  2.5 mg twice daily. - Consider referral to an electrophysiologist for potential ablation if rhythm control is not achieved. - Discuss potential use of CardiaMobile for monitoring AFib recurrence.  Pulmonary Hypertension and HFpEF Pulmonary hypertension and HFpEF present. No current volume overload. Blood pressure well controlled. - Continue furosemide  20 mg daily as needed for fluid management. -  I would like to repeat his echo once in NSR to see if this has improved. Initial echo when diagnosed with PNA and acutely ill.  - Euvolemic today.   Hypertension Blood pressure well controlled with metoprolol . - Continue metoprolol  tartrate 100 mg twice daily.  Chronic Kidney Disease Stage 3A AKI CKD stage 3A limits rhythm control medication options. - Order basic metabolic panel (BMP) to assess kidney function. - Monitor kidney function regularly.          Informed Consent   Shared Decision Making/Informed Consent The risks (stroke, cardiac arrhythmias rarely resulting in the need for a temporary or permanent pacemaker, skin irritation or burns and complications associated with conscious sedation including aspiration, arrhythmia, respiratory failure and death), benefits (restoration of normal sinus rhythm) and alternatives of a  direct current cardioversion were explained in detail to Mr. Pomplun and he agrees to proceed.        Follow-up: Return in about 2 weeks (around 08/02/2023).   Signed, Gigi Kyle. Rolm Clos, MD, [facility]  [facility]  [address], [address] [place], [place] [address] [phone]  10:04 AM

## 2023-07-19 ENCOUNTER — Ambulatory Visit: Attending: Cardiovascular Disease | Admitting: Cardiovascular Disease

## 2023-07-19 ENCOUNTER — Encounter: Payer: Self-pay | Admitting: Cardiovascular Disease

## 2023-07-19 VITALS — BP 128/88 | HR 95 | Ht 69.0 in | Wt 169.6 lb

## 2023-07-19 DIAGNOSIS — I4819 Other persistent atrial fibrillation: Secondary | ICD-10-CM | POA: Diagnosis not present

## 2023-07-19 DIAGNOSIS — N179 Acute kidney failure, unspecified: Secondary | ICD-10-CM | POA: Insufficient documentation

## 2023-07-19 DIAGNOSIS — I272 Pulmonary hypertension, unspecified: Secondary | ICD-10-CM | POA: Diagnosis not present

## 2023-07-19 DIAGNOSIS — I5032 Chronic diastolic (congestive) heart failure: Secondary | ICD-10-CM | POA: Diagnosis not present

## 2023-07-19 LAB — CBC

## 2023-07-19 MED ORDER — AMIODARONE HCL 200 MG PO TABS: ORAL_TABLET | ORAL | 3 refills | Status: DC

## 2023-07-19 MED ORDER — FUROSEMIDE 20 MG PO TABS: 20.0000 mg | ORAL_TABLET | ORAL | 3 refills | Status: DC | PRN

## 2023-07-19 NOTE — Patient Instructions (Addendum)
 Medication Instructions:  - START AMIODARONE  200 MG by mouth twice daily for 7 days. Then take 200 MG by mouth daily until we see you back in the office.   - START LASIX  20 MG BY MOUTH AS NEEDED for swelling or weight gain of 3 pounds overnight or 5 pounds in a week.    *If you need a refill on your cardiac medications before your next appointment, please call your pharmacy*   Lab Work: BASIC METABOLIC PANEL  COMPLETE BLOOD COUNT    If you have labs (blood work) drawn today and your tests are completely normal, you will receive your results only by: MyChart Message (if you have MyChart) OR A paper copy in the mail If you have any lab test that is abnormal or we need to change your treatment, we will call you to review the results.   Testing/Procedures:     Dear Taylor Dillon  You are scheduled for a Cardioversion on Tuesday, May 6 with Dr. Micael Adas.  Please arrive at the [facility] (Main Entrance A) at [facility]: [address] [place], [place] [address] at 6:30 AM (This time is 1 hour(s) before your procedure to ensure your preparation).   Free valet parking service is available. You will check in at ADMITTING.   *Please Note: You will receive a call the day before your procedure to confirm the appointment time. That time may have changed from the original time based on the schedule for that day.*    DIET:  Nothing to eat or drink after midnight except a sip of water with medications (see medication instructions below)  MEDICATION INSTRUCTIONS: !!IF ANY NEW MEDICATIONS ARE STARTED AFTER TODAY, PLEASE NOTIFY YOUR PROVIDER AS SOON AS POSSIBLE!!  FYI: Medications such as Semaglutide (Ozempic, [place]), Tirzepatide (Mounjaro, Zepbound), Dulaglutide (Trulicity), etc ("GLP1 agonists") AND Canagliflozin (Invokana), Dapagliflozin (Farxiga), Empagliflozin (Jardiance), Ertugliflozin (Steglatro), Bexagliflozin [employer]) or any combination with one of these drugs such as  Invokamet (Canagliflozin/Metformin), Synjardy (Empagliflozin/Metformin), etc ("SGLT2 inhibitors") must be held around the time of a procedure. This is not a comprehensive list of all of these drugs. Please review all of your medications and talk to your provider if you take any one of these. If you are not sure, ask your provider.     Continue taking your anticoagulant (blood thinner): Apixaban  (Eliquis ).  You will need to continue this after your procedure until you are told by your provider that it is safe to stop.    LABS:   COMPLETED DURING OFFICE VISIT   FYI:  For your safety, and to allow us  to monitor your vital signs accurately during the surgery/procedure we request: If you have artificial nails, gel coating, SNS etc, please have those removed prior to your surgery/procedure. Not having the nail coverings /polish removed may result in cancellation or delay of your surgery/procedure.  Your support person will be asked to wait in the waiting room during your procedure.  It is OK to have someone drop you off and come back when you are ready to be discharged.  You cannot drive after the procedure and will need someone to drive you home.  Bring your insurance cards.  *Special Note: Every effort is made to have your procedure done on time. Occasionally there are emergencies that occur at the hospital that may cause delays. Please be patient if a delay does occur.       Follow-Up: At [facility], you and your health needs are our priority.  As part of our continuing mission to provide you with exceptional heart care, we have created designated Provider Care Teams.  These Care Teams include your primary Cardiologist (physician) and Advanced Practice Providers (APPs -  Physician Assistants and Nurse Practitioners) who all work together to provide you with the care you need, when you need it.  We recommend signing up for the patient portal called "MyChart".  Sign up information is  provided on this After Visit Summary.  MyChart is used to connect with patients for Virtual Visits (Telemedicine).  Patients are able to view lab/test results, encounter notes, upcoming appointments, etc.  Non-urgent messages can be sent to your provider as well.   To learn more about what you can do with MyChart, go to [email or web address].    Your next appointment:   2 week(s)  The format for your next appointment:   In Person  Provider:   Marlana Silvan, NP    Then, Taylor Mass, MD  will plan to see you again in 3 month(s).   Other Instructions

## 2023-07-20 LAB — CBC
Hematocrit: 40.8 % (ref 37.5–51.0)
Hemoglobin: 13.3 g/dL (ref 13.0–17.7)
MCH: 31.4 pg (ref 26.6–33.0)
MCHC: 32.6 g/dL (ref 31.5–35.7)
MCV: 96 fL (ref 79–97)
Platelets: 125 10*3/uL — ABNORMAL LOW (ref 150–450)
RBC: 4.24 x10E6/uL (ref 4.14–5.80)
RDW: 12.8 % (ref 11.6–15.4)
WBC: 5.6 10*3/uL (ref 3.4–10.8)

## 2023-07-20 LAB — BASIC METABOLIC PANEL WITH GFR
BUN/Creatinine Ratio: 14 (ref 10–24)
BUN: 26 mg/dL (ref 8–27)
CO2: 19 mmol/L — ABNORMAL LOW (ref 20–29)
Calcium: 8.7 mg/dL (ref 8.6–10.2)
Chloride: 102 mmol/L (ref 96–106)
Creatinine, Ser: 1.85 mg/dL — ABNORMAL HIGH (ref 0.76–1.27)
Glucose: 108 mg/dL — ABNORMAL HIGH (ref 70–99)
Potassium: 4.7 mmol/L (ref 3.5–5.2)
Sodium: 133 mmol/L — ABNORMAL LOW (ref 134–144)
eGFR: 35 mL/min/{1.73_m2} — ABNORMAL LOW (ref >59)

## 2023-07-21 ENCOUNTER — Encounter: Payer: Self-pay | Admitting: Cardiovascular Disease

## 2023-07-28 DIAGNOSIS — L304 Erythema intertrigo: Secondary | ICD-10-CM | POA: Diagnosis not present

## 2023-08-02 NOTE — Progress Notes (Signed)
 Called patient with pre-procedure instructions for tomorrow.   Patient informed of:   Time to arrive for procedure. 0630 Remain NPO past midnight.  Must have a ride home and a responsible adult to remain with them for 24 hours post procedure.  Confirmed blood thinner. Eliquis  Confirmed no breaks in taking blood thinner for 3+ weeks prior to procedure.

## 2023-08-02 NOTE — Anesthesia Preprocedure Evaluation (Signed)
 Anesthesia Evaluation  Patient identified by MRN, date of birth, ID band Patient awake    Reviewed: Allergy & Precautions, NPO status , Patient's Chart, lab work & pertinent test results, reviewed documented beta blocker date and time   History of Anesthesia Complications Negative for: history of anesthetic complications  Airway Mallampati: III  TM Distance: >3 FB Neck ROM: Full    Dental  (+) Dental Advisory Given   Pulmonary neg shortness of breath, neg sleep apnea, neg COPD, neg recent URI   breath sounds clear to auscultation       Cardiovascular hypertension, Pt. on medications and Pt. on home beta blockers pulmonary hypertension+CHF  + dysrhythmias Atrial Fibrillation + Valvular Problems/Murmurs MR  Rhythm:Irregular Rate:Tachycardia  Echo 03/2023  1. Left ventricular ejection fraction, by estimation, is 55 to 60%. The left ventricle has normal function. The left ventricle has no regional wall motion abnormalities. There is mild left ventricular hypertrophy. Left ventricular diastolic parameters are indeterminate.   2. Hypertrophy at RV apex.. Right ventricular systolic function is moderately reduced. The right ventricular size is mildly enlarged. There is severely elevated pulmonary artery systolic pressure.   3. Left atrial size was severely dilated.   4. Right atrial size was moderately dilated.   5. The mitral valve is normal in structure. Mild mitral valve regurgitation.   6. The aortic valve is tricuspid. Aortic valve regurgitation is not visualized. Aortic valve sclerosis/calcification is present, without any evidence of aortic stenosis.   7. The inferior vena cava is dilated in size with <50% respiratory variability, suggesting right atrial pressure of 15 mmHg.      Neuro/Psych  Neuromuscular disease    GI/Hepatic negative GI ROS, Neg liver ROS,,,  Endo/Other  negative endocrine ROS    Renal/GU negative Renal ROS      Musculoskeletal negative musculoskeletal ROS (+)    Abdominal   Peds  Hematology  (+) Blood dyscrasia, anemia Lab Results      Component                Value               Date                      WBC                      5.7                 07/08/2023                HGB                      14.1                07/08/2023                HCT                      43.9                07/08/2023                MCV                      97                  07/08/2023  PLT                      139 (L)             07/08/2023             eliquis    Anesthesia Other Findings   Reproductive/Obstetrics                              Anesthesia Physical Anesthesia Plan  ASA: 4  Anesthesia Plan: General   Post-op Pain Management: Minimal or no pain anticipated   Induction: Intravenous  PONV Risk Score and Plan: 2 and Treatment may vary due to age or medical condition  Airway Management Planned: Nasal Cannula, Natural Airway and Simple Face Mask  Additional Equipment: None  Intra-op Plan:   Post-operative Plan:   Informed Consent: I have reviewed the patients History and Physical, chart, labs and discussed the procedure including the risks, benefits and alternatives for the proposed anesthesia with the patient or authorized representative who has indicated his/her understanding and acceptance.     Dental advisory given  Plan Discussed with: CRNA  Anesthesia Plan Comments:          Anesthesia Quick Evaluation

## 2023-08-03 ENCOUNTER — Other Ambulatory Visit: Payer: Self-pay

## 2023-08-03 ENCOUNTER — Encounter (HOSPITAL_COMMUNITY)
Admission: RE | Disposition: A | Discharge status: Home / Self Care | Payer: Self-pay | Source: Home / Self Care | Attending: Cardiology

## 2023-08-03 ENCOUNTER — Ambulatory Visit (HOSPITAL_BASED_OUTPATIENT_CLINIC_OR_DEPARTMENT_OTHER): Payer: Self-pay | Admitting: Anesthesiology

## 2023-08-03 ENCOUNTER — Ambulatory Visit (HOSPITAL_COMMUNITY): Payer: Self-pay | Admitting: Anesthesiology

## 2023-08-03 ENCOUNTER — Ambulatory Visit (HOSPITAL_COMMUNITY)
Admission: RE | Admit: 2023-08-03 | Discharge: 2023-08-03 | Disposition: A | Discharge status: Home / Self Care | Attending: Cardiology | Admitting: Cardiology

## 2023-08-03 DIAGNOSIS — G4733 Obstructive sleep apnea (adult) (pediatric): Secondary | ICD-10-CM | POA: Diagnosis not present

## 2023-08-03 DIAGNOSIS — N179 Acute kidney failure, unspecified: Secondary | ICD-10-CM | POA: Insufficient documentation

## 2023-08-03 DIAGNOSIS — Z79899 Other long term (current) drug therapy: Secondary | ICD-10-CM | POA: Diagnosis not present

## 2023-08-03 DIAGNOSIS — I110 Hypertensive heart disease with heart failure: Secondary | ICD-10-CM | POA: Diagnosis not present

## 2023-08-03 DIAGNOSIS — I130 Hypertensive heart and chronic kidney disease with heart failure and stage 1 through stage 4 chronic kidney disease, or unspecified chronic kidney disease: Secondary | ICD-10-CM | POA: Diagnosis not present

## 2023-08-03 DIAGNOSIS — Z7901 Long term (current) use of anticoagulants: Secondary | ICD-10-CM | POA: Insufficient documentation

## 2023-08-03 DIAGNOSIS — E785 Hyperlipidemia, unspecified: Secondary | ICD-10-CM | POA: Insufficient documentation

## 2023-08-03 DIAGNOSIS — I4891 Unspecified atrial fibrillation: Secondary | ICD-10-CM

## 2023-08-03 DIAGNOSIS — I509 Heart failure, unspecified: Secondary | ICD-10-CM

## 2023-08-03 DIAGNOSIS — I5032 Chronic diastolic (congestive) heart failure: Secondary | ICD-10-CM | POA: Insufficient documentation

## 2023-08-03 DIAGNOSIS — I4819 Other persistent atrial fibrillation: Secondary | ICD-10-CM

## 2023-08-03 DIAGNOSIS — D631 Anemia in chronic kidney disease: Secondary | ICD-10-CM | POA: Diagnosis not present

## 2023-08-03 DIAGNOSIS — I272 Pulmonary hypertension, unspecified: Secondary | ICD-10-CM | POA: Diagnosis not present

## 2023-08-03 DIAGNOSIS — N1831 Chronic kidney disease, stage 3a: Secondary | ICD-10-CM | POA: Diagnosis not present

## 2023-08-03 DIAGNOSIS — I10 Essential (primary) hypertension: Secondary | ICD-10-CM | POA: Diagnosis not present

## 2023-08-03 DIAGNOSIS — Z905 Acquired absence of kidney: Secondary | ICD-10-CM | POA: Diagnosis not present

## 2023-08-03 HISTORY — PX: CARDIOVERSION: EP1203

## 2023-08-03 SURGERY — CARDIOVERSION (CATH LAB)
Anesthesia: General

## 2023-08-03 MED ORDER — METOPROLOL TARTRATE 100 MG PO TABS: 50.0000 mg | ORAL_TABLET | Freq: Two times a day (BID) | ORAL | 3 refills | Status: DC

## 2023-08-03 MED ORDER — LIDOCAINE 2% (20 MG/ML) 5 ML SYRINGE
INTRAMUSCULAR | Status: DC | PRN
  Administered 2023-08-03: 50 mg via INTRAVENOUS

## 2023-08-03 MED ORDER — SODIUM CHLORIDE 0.9% FLUSH
3.0000 mL | Freq: Two times a day (BID) | INTRAVENOUS | Status: DC
  Administered 2023-08-03 (×2): 5 mL via INTRAVENOUS

## 2023-08-03 MED ORDER — SODIUM CHLORIDE 0.9% FLUSH: 3.0000 mL | INTRAVENOUS | Status: DC | PRN

## 2023-08-03 MED ORDER — PROPOFOL 10 MG/ML IV BOLUS
INTRAVENOUS | Status: DC | PRN
Start: 2023-08-03 — End: 2023-08-03
  Administered 2023-08-03: 50 mg via INTRAVENOUS

## 2023-08-03 SURGICAL SUPPLY — 1 items: PAD DEFIB RADIO PHYSIO CONN (PAD) ×2 IMPLANT

## 2023-08-03 NOTE — Interval H&P Note (Signed)
 History and Physical Interval Note:  08/03/2023 7:31 AM  Taylor Dillon  has presented today for surgery, with the diagnosis of AFIB.  The various methods of treatment have been discussed with the patient and family. After consideration of risks, benefits and other options for treatment, the patient has consented to  Procedure(s): CARDIOVERSION (N/A) as a surgical intervention.  The patient's history has been reviewed, patient examined, no change in status, stable for surgery.  I have reviewed the patient's chart and labs.  Questions were answered to the patient's satisfaction.     Gaylyn Keas

## 2023-08-03 NOTE — CV Procedure (Addendum)
    Electrical Cardioversion Procedure Note LARELL KULPA [id number] 1940-12-29  Procedure: Electrical Cardioversion Indications:  Atrial Fibrillation  Time Out: Verified patient identification, verified procedure,medications/allergies/relevent history reviewed, required imaging and test results available.  Performed  Procedure Details  During this procedure the patient is administered a total of Propofol  50 mg and Lidocaine  50 mg to achieve and maintain moderate conscious sedation.  The patient's heart rate, blood pressure, and oxygen saturation are monitored continuously during the procedure. The period of conscious sedation is 2 minutes, of which I was present face-to-face 100% of this time. Claudene Crystal, CRNA is an independent, trained observer who assisted in the monitoring of the patient's level of consciousness.     Cardioversion was done with synchronized biphasic defibrillation with AP pads with 200watts.  The patient converted to sinus bradycardia with occasional sinus pauses with ventricular escape and then to sinus bradycardia. The patient tolerated the procedure well   IMPRESSION:  Successful cardioversion of atrial fibrillation  Patient was monitored for 3 hours post DCCV due to bradyarrhythmias including intermittent sinus pauses with ventricular escape rhythm.   Currently in NSR in the upper 60's.   Discussed with EP and will hold further Lopressor  today.   Decrease Lopressor  to 50mg  BID starting tomorrow Continue Amio 200mg  daily Followup in afib clinic on Thursday 5/8 at     [facility] 08/03/2023, 7:31 AM

## 2023-08-03 NOTE — Transfer of Care (Signed)
 Immediate Anesthesia Transfer of Care Note  Patient: Taylor Dillon  Procedure(s) Performed: CARDIOVERSION  Patient Location: PACU and Cath Lab  Anesthesia Type:MAC  Level of Consciousness: awake and sedated  Airway & Oxygen Therapy: Patient Spontanous Breathing and Patient connected to nasal cannula oxygen  Post-op Assessment: Report given to RN and Post -op Vital signs reviewed and stable  Post vital signs: Reviewed and stable  Last Vitals:  Vitals Value Taken Time  BP 133/73 08/03/23 0800  Temp    Pulse 36 08/03/23 0801  Resp 19 08/03/23 0801  SpO2 96 % 08/03/23 0801  Vitals shown include unfiled device data.  Last Pain:  Vitals:   08/03/23 0658  TempSrc: Temporal  PainSc: 0-No pain         Complications: No notable events documented.

## 2023-08-04 ENCOUNTER — Encounter (HOSPITAL_COMMUNITY): Payer: Self-pay | Admitting: Cardiology

## 2023-08-04 NOTE — Anesthesia Postprocedure Evaluation (Signed)
 Anesthesia Post Note  Patient: Taylor Dillon  Procedure(s) Performed: CARDIOVERSION     Patient location during evaluation: Cath Lab Anesthesia Type: General Level of consciousness: sedated and patient cooperative Pain management: pain level controlled Vital Signs Assessment: post-procedure vital signs reviewed and stable Respiratory status: spontaneous breathing Cardiovascular status: stable Anesthetic complications: no   No notable events documented.  Last Vitals:  Vitals:   08/03/23 1045 08/03/23 1100  BP: (!) 159/83 (!) 164/82  Pulse: (!) 45 (!) 43  Resp: 13 15  Temp:    SpO2: 96% 96%    Last Pain:  Vitals:   08/03/23 0805  TempSrc:   PainSc: 0-No pain                 Gorman Laughter

## 2023-08-05 ENCOUNTER — Ambulatory Visit (HOSPITAL_COMMUNITY)
Admission: RE | Admit: 2023-08-05 | Discharge: 2023-08-05 | Disposition: A | Discharge status: Home / Self Care | Source: Ambulatory Visit | Attending: Internal Medicine | Admitting: Internal Medicine

## 2023-08-05 ENCOUNTER — Encounter (HOSPITAL_COMMUNITY): Payer: Self-pay | Admitting: Internal Medicine

## 2023-08-05 VITALS — BP 150/100 | HR 96 | Ht 69.0 in | Wt 173.4 lb

## 2023-08-05 DIAGNOSIS — Z5181 Encounter for therapeutic drug level monitoring: Secondary | ICD-10-CM | POA: Diagnosis not present

## 2023-08-05 DIAGNOSIS — I4819 Other persistent atrial fibrillation: Secondary | ICD-10-CM | POA: Insufficient documentation

## 2023-08-05 DIAGNOSIS — D6869 Other thrombophilia: Secondary | ICD-10-CM | POA: Diagnosis not present

## 2023-08-05 DIAGNOSIS — I480 Paroxysmal atrial fibrillation: Secondary | ICD-10-CM | POA: Insufficient documentation

## 2023-08-05 DIAGNOSIS — Z79899 Other long term (current) drug therapy: Secondary | ICD-10-CM | POA: Diagnosis not present

## 2023-08-05 MED ORDER — AMIODARONE HCL 200 MG PO TABS: ORAL_TABLET | ORAL | 1 refills | Status: DC

## 2023-08-05 MED ORDER — METOPROLOL TARTRATE 100 MG PO TABS: 50.0000 mg | ORAL_TABLET | Freq: Two times a day (BID) | ORAL | Status: DC

## 2023-08-05 NOTE — H&P (View-Only) (Signed)
 Primary Care Physician: Mordechai April, DO Primary Cardiologist: Dr. 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 male with a history of pulmonary hypertension, aortic and coronary atherosclerosis by CT imaging, CKD stage 3a, history of renal cell carcinoma s/p nephrectomy, sobriety since 1996, HTN, HLD, atrial fibrillation who presents for consultation in the [facility]. First diagnosed with Afib 03/2023 concomitantly with pneumonia. Initial DCCV successful but patient had ERAF; s/p repeat DCCV on amiodarone . Procedure successful but patient monitored for 3 hours post procedure due to sinus pauses and bradyarrhythmias. Lopressor  decreased to 50 mg BID. He is on amiodarone  200 mg daily. Patient is on Eliquis  2.5 mg BID for a CHADS2VASC score of 4.  On evaluation today, he is currently in Afib. He noted to feel better when he was in sinus rhythm. He does note that over the past couple of days he was short of breath again and suspected he was back in Afib.   Today, he denies symptoms of palpitations, chest pain, orthopnea, PND, lower extremity edema, dizziness, presyncope, syncope, snoring, daytime somnolence, bleeding, or neurologic sequela. The patient is tolerating medications without difficulties and is otherwise without complaint today.     he has a BMI of Body mass index is 25.61 kg/m.Aaron Aas Filed Weights   08/05/23 1448  Weight: 78.7 kg    Current Outpatient Medications  Medication Sig Dispense Refill   apixaban  (ELIQUIS ) 2.5 MG TABS tablet Take 1 tablet (2.5 mg total) by mouth 2 (two) times daily. 180 tablet 1   furosemide  (LASIX ) 20 MG tablet Take 1 tablet (20 mg total) by mouth as needed. For swelling or weight gain of 3lbs over night or 5lbs in a week. 90 tablet 3   Multiple Vitamins-Minerals (CENTRUM SILVER ADULT 50+ PO) Take 1 tablet by mouth daily.     simvastatin  (ZOCOR ) 20 MG tablet Take 20  mg by mouth at bedtime.     terazosin  (HYTRIN ) 5 MG capsule Take 5 mg by mouth 2 (two) times daily.     amiodarone  (PACERONE ) 200 MG tablet Take 1 tablet (200 mg total) by mouth 2 (two) times daily for 21 days, THEN 1 tablet (200 mg total) daily. 120 tablet 1   metoprolol  tartrate (LOPRESSOR ) 100 MG tablet Take 0.5 tablets (50 mg total) by mouth 2 (two) times daily for 21 days.     No current facility-administered medications for this encounter.    Atrial Fibrillation Management history:  Previous antiarrhythmic drugs: amiodarone  Previous cardioversions: 07/14/23, 08/03/23 Previous ablations: none Anticoagulation history: Eliquis  2.5   ROS- All systems are reviewed and negative except as per the HPI above.  Physical Exam: BP (!) 150/100   Pulse 96   Ht 5\' 9"  (1.753 m)   Wt 78.7 kg   BMI 25.61 kg/m   GEN: Well nourished, well developed in no acute distress NECK: No JVD; No carotid bruits CARDIAC: Irregularly irregular rate and rhythm, no murmurs, rubs, gallops RESPIRATORY:  Clear to auscultation without rales, wheezing or rhonchi  ABDOMEN: Soft, non-tender, non-distended EXTREMITIES:  No edema; No deformity   EKG today demonstrates  Vent. rate 96 BPM PR interval * ms QRS duration 94 ms QT/QTcB 338/427 ms P-R-T axes * 111 -4 Atrial fibrillation Right axis deviation Pulmonary disease pattern Abnormal QRS-T angle, consider primary T wave abnormality Abnormal ECG When compared with ECG of 03-Aug-2023 08:34, Atrial fibrillation has replaced Wide QRS rhythm  Vent. rate has increased BY 46 BPM  Echo 04/11/23 demonstrated   1. Left ventricular ejection fraction, by estimation, is 55 to 60%. The  left ventricle has normal function. The left ventricle has no regional  wall motion abnormalities. There is mild left ventricular hypertrophy.  Left ventricular diastolic parameters  are indeterminate.   2. Hypertrophy at RV apex.. Right ventricular systolic function is  moderately  reduced. The right ventricular size is mildly enlarged. There  is severely elevated pulmonary artery systolic pressure.   3. Left atrial size was severely dilated.   4. Right atrial size was moderately dilated.   5. The mitral valve is normal in structure. Mild mitral valve  regurgitation.   6. The aortic valve is tricuspid. Aortic valve regurgitation is not  visualized. Aortic valve sclerosis/calcification is present, without any  evidence of aortic stenosis.   7. The inferior vena cava is dilated in size with <50% respiratory  variability, suggesting right atrial pressure of 15 mmHg.   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    He is currently in Afib. He began amiodarone  200 mg BID x 7 days then 200 mg daily. He was in sinus rhythm for several days following most recent cardioversion. Discussion with patient on treatment plan. Unsure if he is a good candidate for ablation considering single kidney with abnormal creatinine and moderately reduced RV systolic function. I will discuss this with EP to confirm whether it's a possible choice. For now, will extend patient's amiodarone  load of 200 mg BID x 3 weeks then 200 mg daily. We discussed repeat cardioversion as final attempt with amiodarone  and patient is in agreement to proceed with cardioversion. We discussed the risks vs benefits of procedure and he wishes to proceed. Labs drawn today.   Informed Consent   Shared Decision Making/Informed Consent The risks (stroke, cardiac arrhythmias rarely resulting in the need for a temporary or permanent pacemaker, skin irritation or burns and complications associated with  conscious sedation including aspiration, arrhythmia, respiratory failure and death), benefits (restoration of normal sinus rhythm) and alternatives of a direct current cardioversion were explained in detail to Mr. Longtin and he agrees to proceed.      High risk medication monitoring (ICD10: J342684) Patient requires ongoing monitoring for anti-arrhythmic medication which has the potential to cause life threatening arrhythmias or AV block. Qtc stable. Begin amiodarone  reload 200 mg BID x 3 weeks then 200 mg once daily.  Secondary Hypercoagulable State (ICD10:  D68.69) The patient is at significant risk for stroke/thromboembolism based upon his CHA2DS2-VASc Score of 4.  Continue Apixaban  (Eliquis ).  Continue Eliquis  2.5 mg BID; no missed doses.   Addendum 08/09/23: After discussion with EP, less likely to be considered an ablation candidate due to abnormal creatinine and moderately reduced RV systolic function. Continue with current medication management.   Follow up 2 weeks after DCCV.    Minnie Amber, PA-C  Afib [facility] [address] [place], [place] [address] [phone]

## 2023-08-05 NOTE — Progress Notes (Addendum)
 Primary Care Physician: Mordechai April, DO Primary Cardiologist: Dr. 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 male with a history of pulmonary hypertension, aortic and coronary atherosclerosis by CT imaging, CKD stage 3a, history of renal cell carcinoma s/p nephrectomy, sobriety since 1996, HTN, HLD, atrial fibrillation who presents for consultation in the [facility]. First diagnosed with Afib 03/2023 concomitantly with pneumonia. Initial DCCV successful but patient had ERAF; s/p repeat DCCV on amiodarone . Procedure successful but patient monitored for 3 hours post procedure due to sinus pauses and bradyarrhythmias. Lopressor  decreased to 50 mg BID. He is on amiodarone  200 mg daily. Patient is on Eliquis  2.5 mg BID for a CHADS2VASC score of 4.  On evaluation today, he is currently in Afib. He noted to feel better when he was in sinus rhythm. He does note that over the past couple of days he was short of breath again and suspected he was back in Afib.   Today, he denies symptoms of palpitations, chest pain, orthopnea, PND, lower extremity edema, dizziness, presyncope, syncope, snoring, daytime somnolence, bleeding, or neurologic sequela. The patient is tolerating medications without difficulties and is otherwise without complaint today.     he has a BMI of Body mass index is 25.61 kg/m.Taylor Dillon Filed Weights   08/05/23 1448  Weight: 78.7 kg    Current Outpatient Medications  Medication Sig Dispense Refill   apixaban  (ELIQUIS ) 2.5 MG TABS tablet Take 1 tablet (2.5 mg total) by mouth 2 (two) times daily. 180 tablet 1   furosemide  (LASIX ) 20 MG tablet Take 1 tablet (20 mg total) by mouth as needed. For swelling or weight gain of 3lbs over night or 5lbs in a week. 90 tablet 3   Multiple Vitamins-Minerals (CENTRUM SILVER ADULT 50+ PO) Take 1 tablet by mouth daily.     simvastatin  (ZOCOR ) 20 MG tablet Take 20  mg by mouth at bedtime.     terazosin  (HYTRIN ) 5 MG capsule Take 5 mg by mouth 2 (two) times daily.     amiodarone  (PACERONE ) 200 MG tablet Take 1 tablet (200 mg total) by mouth 2 (two) times daily for 21 days, THEN 1 tablet (200 mg total) daily. 120 tablet 1   metoprolol  tartrate (LOPRESSOR ) 100 MG tablet Take 0.5 tablets (50 mg total) by mouth 2 (two) times daily for 21 days.     No current facility-administered medications for this encounter.    Atrial Fibrillation Management history:  Previous antiarrhythmic drugs: amiodarone  Previous cardioversions: 07/14/23, 08/03/23 Previous ablations: none Anticoagulation history: Eliquis  2.5   ROS- All systems are reviewed and negative except as per the HPI above.  Physical Exam: BP (!) 150/100   Pulse 96   Ht 5\' 9"  (1.753 m)   Wt 78.7 kg   BMI 25.61 kg/m   GEN: Well nourished, well developed in no acute distress NECK: No JVD; No carotid bruits CARDIAC: Irregularly irregular rate and rhythm, no murmurs, rubs, gallops RESPIRATORY:  Clear to auscultation without rales, wheezing or rhonchi  ABDOMEN: Soft, non-tender, non-distended EXTREMITIES:  No edema; No deformity   EKG today demonstrates  Vent. rate 96 BPM PR interval * ms QRS duration 94 ms QT/QTcB 338/427 ms P-R-T axes * 111 -4 Atrial fibrillation Right axis deviation Pulmonary disease pattern Abnormal QRS-T angle, consider primary T wave abnormality Abnormal ECG When compared with ECG of 03-Aug-2023 08:34, Atrial fibrillation has replaced Wide QRS rhythm  Vent. rate has increased BY 46 BPM  Echo 04/11/23 demonstrated   1. Left ventricular ejection fraction, by estimation, is 55 to 60%. The  left ventricle has normal function. The left ventricle has no regional  wall motion abnormalities. There is mild left ventricular hypertrophy.  Left ventricular diastolic parameters  are indeterminate.   2. Hypertrophy at RV apex.. Right ventricular systolic function is  moderately  reduced. The right ventricular size is mildly enlarged. There  is severely elevated pulmonary artery systolic pressure.   3. Left atrial size was severely dilated.   4. Right atrial size was moderately dilated.   5. The mitral valve is normal in structure. Mild mitral valve  regurgitation.   6. The aortic valve is tricuspid. Aortic valve regurgitation is not  visualized. Aortic valve sclerosis/calcification is present, without any  evidence of aortic stenosis.   7. The inferior vena cava is dilated in size with <50% respiratory  variability, suggesting right atrial pressure of 15 mmHg.   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    He is currently in Afib. He began amiodarone  200 mg BID x 7 days then 200 mg daily. He was in sinus rhythm for several days following most recent cardioversion. Discussion with patient on treatment plan. Unsure if he is a good candidate for ablation considering single kidney with abnormal creatinine and moderately reduced RV systolic function. I will discuss this with EP to confirm whether it's a possible choice. For now, will extend patient's amiodarone  load of 200 mg BID x 3 weeks then 200 mg daily. We discussed repeat cardioversion as final attempt with amiodarone  and patient is in agreement to proceed with cardioversion. We discussed the risks vs benefits of procedure and he wishes to proceed. Labs drawn today.   Informed Consent   Shared Decision Making/Informed Consent The risks (stroke, cardiac arrhythmias rarely resulting in the need for a temporary or permanent pacemaker, skin irritation or burns and complications associated with  conscious sedation including aspiration, arrhythmia, respiratory failure and death), benefits (restoration of normal sinus rhythm) and alternatives of a direct current cardioversion were explained in detail to Mr. Longtin and he agrees to proceed.      High risk medication monitoring (ICD10: J342684) Patient requires ongoing monitoring for anti-arrhythmic medication which has the potential to cause life threatening arrhythmias or AV block. Qtc stable. Begin amiodarone  reload 200 mg BID x 3 weeks then 200 mg once daily.  Secondary Hypercoagulable State (ICD10:  D68.69) The patient is at significant risk for stroke/thromboembolism based upon his CHA2DS2-VASc Score of 4.  Continue Apixaban  (Eliquis ).  Continue Eliquis  2.5 mg BID; no missed doses.   Addendum 08/09/23: After discussion with EP, less likely to be considered an ablation candidate due to abnormal creatinine and moderately reduced RV systolic function. Continue with current medication management.   Follow up 2 weeks after DCCV.    Taylor Amber, PA-C  Af[facility] Clinic [facility] [address] [place], [place] [address] [phone]

## 2023-08-05 NOTE — Patient Instructions (Signed)
 Amiodarone  200mg  twice a day until May 29th then reduce to once a day    On May 29th -- STOP metoprolol    Cardioversion scheduled for: Friday, May 30th   - Arrive at the [employer] [facility]A" of [facility] ([address]  and check in with ADMITTING at 6:30AM   - Do not eat or drink anything after midnight the night prior to your procedure.   - Take all your morning medication (except diabetic medications) with a sip of water prior to arrival.  - Do NOT miss any doses of your blood thinner - if you should miss a dose or take a dose more than 4 hours late -- please notify our office immediately.  - You will not be able to drive home after your procedure. Please ensure you have a responsible adult to drive you home. You will need someone with you for 24 hours post procedure.     - Expect to be in the procedural area approximately 2 hours.   - If you feel as if you go back into normal rhythm prior to scheduled cardioversion, please notify our office immediately.   If your procedure is canceled in the cardioversion suite you will be charged a cancellation fee.

## 2023-08-06 DIAGNOSIS — N1832 Chronic kidney disease, stage 3b: Secondary | ICD-10-CM | POA: Diagnosis not present

## 2023-08-06 DIAGNOSIS — I480 Paroxysmal atrial fibrillation: Secondary | ICD-10-CM | POA: Diagnosis not present

## 2023-08-06 DIAGNOSIS — I10 Essential (primary) hypertension: Secondary | ICD-10-CM | POA: Diagnosis not present

## 2023-08-06 DIAGNOSIS — Z23 Encounter for immunization: Secondary | ICD-10-CM | POA: Diagnosis not present

## 2023-08-07 LAB — CBC
Hematocrit: 41.1 % (ref 37.5–51.0)
Hemoglobin: 13 g/dL (ref 13.0–17.7)
MCH: 30.9 pg (ref 26.6–33.0)
MCHC: 31.6 g/dL (ref 31.5–35.7)
MCV: 98 fL — ABNORMAL HIGH (ref 79–97)
Platelets: 115 10*3/uL — ABNORMAL LOW (ref 150–450)
RBC: 4.21 x10E6/uL (ref 4.14–5.80)
RDW: 12.9 % (ref 11.6–15.4)
WBC: 5.5 10*3/uL (ref 3.4–10.8)

## 2023-08-07 LAB — BASIC METABOLIC PANEL WITH GFR
BUN/Creatinine Ratio: 12 (ref 10–24)
BUN: 29 mg/dL — ABNORMAL HIGH (ref 8–27)
CO2: 20 mmol/L (ref 20–29)
Calcium: 9 mg/dL (ref 8.6–10.2)
Chloride: 102 mmol/L (ref 96–106)
Creatinine, Ser: 2.4 mg/dL — ABNORMAL HIGH (ref 0.76–1.27)
Glucose: 102 mg/dL — ABNORMAL HIGH (ref 70–99)
Potassium: 4.7 mmol/L (ref 3.5–5.2)
Sodium: 136 mmol/L (ref 134–144)
eGFR: 26 mL/min/{1.73_m2} — ABNORMAL LOW (ref >59)

## 2023-08-09 NOTE — Addendum Note (Signed)
 Encounter addended by: Nathanel Bal, PA-C on: 08/09/2023 10:47 AM  Actions taken: Clinical Note Signed

## 2023-08-26 NOTE — Anesthesia Preprocedure Evaluation (Addendum)
 Anesthesia Evaluation  Patient identified by MRN, date of birth, ID band Patient awake    Reviewed: Allergy & Precautions, NPO status , Patient's Chart, lab work & pertinent test results, reviewed documented beta blocker date and time   History of Anesthesia Complications Negative for: history of anesthetic complications  Airway Mallampati: III  TM Distance: >3 FB Neck ROM: Full    Dental  (+) Dental Advisory Given   Pulmonary neg shortness of breath, neg sleep apnea, neg COPD, neg recent URI   breath sounds clear to auscultation       Cardiovascular hypertension, Pt. on medications and Pt. on home beta blockers pulmonary hypertension+CHF  + dysrhythmias Atrial Fibrillation + Valvular Problems/Murmurs MR  Rhythm:Irregular Rate:Tachycardia  Echo 03/2023  1. Left ventricular ejection fraction, by estimation, is 55 to 60%. The left ventricle has normal function. The left ventricle has no regional wall motion abnormalities. There is mild left ventricular hypertrophy. Left ventricular diastolic parameters are indeterminate.   2. Hypertrophy at RV apex.. Right ventricular systolic function is moderately reduced. The right ventricular size is mildly enlarged. There is severely elevated pulmonary artery systolic pressure.   3. Left atrial size was severely dilated.   4. Right atrial size was moderately dilated.   5. The mitral valve is normal in structure. Mild mitral valve regurgitation.   6. The aortic valve is tricuspid. Aortic valve regurgitation is not visualized. Aortic valve sclerosis/calcification is present, without any evidence of aortic stenosis.   7. The inferior vena cava is dilated in size with <50% respiratory variability, suggesting right atrial pressure of 15 mmHg.      Neuro/Psych  Neuromuscular disease    GI/Hepatic negative GI ROS, Neg liver ROS,,,  Endo/Other  negative endocrine ROS    Renal/GU negative Renal ROS      Musculoskeletal negative musculoskeletal ROS (+)    Abdominal   Peds  Hematology  (+) Blood dyscrasia, anemia   Anesthesia Other Findings   Reproductive/Obstetrics                             Anesthesia Physical Anesthesia Plan  ASA: 4  Anesthesia Plan: General   Post-op Pain Management: Minimal or no pain anticipated   Induction: Intravenous  PONV Risk Score and Plan: 2 and Treatment may vary due to age or medical condition  Airway Management Planned: Nasal Cannula, Natural Airway and Simple Face Mask  Additional Equipment: None  Intra-op Plan:   Post-operative Plan:   Informed Consent: I have reviewed the patients History and Physical, chart, labs and discussed the procedure including the risks, benefits and alternatives for the proposed anesthesia with the patient or authorized representative who has indicated his/her understanding and acceptance.     Dental advisory given  Plan Discussed with: CRNA  Anesthesia Plan Comments:         Anesthesia Quick Evaluation

## 2023-08-26 NOTE — Progress Notes (Signed)
 Spoke to patient and instructed them to come at 0630  and to be NPO after 0000.  Medications reviewed.    Confirmed that patient will have a ride home and someone to stay with them for 24 hours after the procedure.

## 2023-08-27 ENCOUNTER — Ambulatory Visit (HOSPITAL_COMMUNITY)
Admission: RE | Admit: 2023-08-27 | Discharge: 2023-08-27 | Disposition: A | Discharge status: Home / Self Care | Attending: Cardiology | Admitting: Cardiology

## 2023-08-27 ENCOUNTER — Other Ambulatory Visit: Payer: Self-pay

## 2023-08-27 ENCOUNTER — Encounter (HOSPITAL_COMMUNITY)
Admission: RE | Disposition: A | Discharge status: Home / Self Care | Payer: Self-pay | Source: Home / Self Care | Attending: Cardiology

## 2023-08-27 ENCOUNTER — Encounter: Payer: Self-pay | Admitting: Cardiovascular Disease

## 2023-08-27 ENCOUNTER — Ambulatory Visit (HOSPITAL_COMMUNITY): Payer: Self-pay

## 2023-08-27 DIAGNOSIS — I110 Hypertensive heart disease with heart failure: Secondary | ICD-10-CM | POA: Insufficient documentation

## 2023-08-27 DIAGNOSIS — I4819 Other persistent atrial fibrillation: Secondary | ICD-10-CM | POA: Insufficient documentation

## 2023-08-27 DIAGNOSIS — I4891 Unspecified atrial fibrillation: Secondary | ICD-10-CM

## 2023-08-27 DIAGNOSIS — Z7901 Long term (current) use of anticoagulants: Secondary | ICD-10-CM | POA: Insufficient documentation

## 2023-08-27 DIAGNOSIS — I509 Heart failure, unspecified: Secondary | ICD-10-CM

## 2023-08-27 DIAGNOSIS — E785 Hyperlipidemia, unspecified: Secondary | ICD-10-CM

## 2023-08-27 DIAGNOSIS — Z79899 Other long term (current) drug therapy: Secondary | ICD-10-CM | POA: Insufficient documentation

## 2023-08-27 HISTORY — PX: CARDIOVERSION: EP1203

## 2023-08-27 LAB — POCT I-STAT, CHEM 8
BUN: 25 mg/dL — ABNORMAL HIGH (ref 8–23)
Calcium, Ion: 1.23 mmol/L (ref 1.15–1.40)
Chloride: 105 mmol/L (ref 98–111)
Creatinine, Ser: 2.4 mg/dL — ABNORMAL HIGH (ref 0.61–1.24)
Glucose, Bld: 121 mg/dL — ABNORMAL HIGH (ref 70–99)
HCT: 41 % (ref 39.0–52.0)
Hemoglobin: 13.9 g/dL (ref 13.0–17.0)
Potassium: 4 mmol/L (ref 3.5–5.1)
Sodium: 138 mmol/L (ref 135–145)
TCO2: 20 mmol/L — ABNORMAL LOW (ref 22–32)

## 2023-08-27 SURGERY — CARDIOVERSION (CATH LAB)
Anesthesia: General

## 2023-08-27 MED ORDER — LIDOCAINE 2% (20 MG/ML) 5 ML SYRINGE
INTRAMUSCULAR | Status: DC | PRN
  Administered 2023-08-27: 60 mg via INTRAVENOUS

## 2023-08-27 MED ORDER — PROPOFOL 10 MG/ML IV BOLUS
INTRAVENOUS | Status: DC | PRN
  Administered 2023-08-27: 50 mg via INTRAVENOUS

## 2023-08-27 SURGICAL SUPPLY — 1 items: PAD DEFIB RADIO PHYSIO CONN (PAD) ×2 IMPLANT

## 2023-08-27 NOTE — Interval H&P Note (Signed)
 History and Physical Interval Note:  08/27/2023 7:53 AM  Ezella Holly  has presented today for surgery, with the diagnosis of AFIB.  The various methods of treatment have been discussed with the patient and family. After consideration of risks, benefits and other options for treatment, the patient has consented to  Procedure(s): CARDIOVERSION (N/A) as a surgical intervention.  The patient's history has been reviewed, patient examined, no change in status, stable for surgery.  I have reviewed the patient's chart and labs.  Questions were answered to the patient's satisfaction.    No missed doses of Eliquis  per patient.  Contact person: Son if needed - patient does not want his son updated after the procedure if all goes well. Friend will take him home.   Informed Consent   Shared Decision Making/Informed Consent The risks (stroke, cardiac arrhythmias rarely resulting in the need for a temporary or permanent pacemaker, skin irritation or burns and complications associated with conscious sedation including aspiration, arrhythmia, respiratory failure and death), benefits (restoration of normal sinus rhythm) and alternatives of a direct current cardioversion were explained in detail to Mr. Tavenner and he agrees to proceed.      Awilda Bogus, [facility]  A Division of [facility] [address], [place], [place] [address]  [place], [place] [address] 7:55 AM

## 2023-08-27 NOTE — Transfer of Care (Signed)
 Immediate Anesthesia Transfer of Care Note  Patient: Taylor Dillon  Procedure(s) Performed: CARDIOVERSION  Patient Location: PACU and Cath Lab  Anesthesia Type:General  Level of Consciousness: drowsy  Airway & Oxygen Therapy: Patient connected to nasal cannula oxygen  Post-op Assessment: Report given to RN and Post -op Vital signs reviewed and stable  Post vital signs: Reviewed and stable  Last Vitals:  Vitals Value Taken Time  BP    Temp    Pulse    Resp    SpO2      Last Pain:  Vitals:   08/27/23 0717  TempSrc: Temporal         Complications: No notable events documented.

## 2023-08-27 NOTE — Discharge Instructions (Signed)

## 2023-08-27 NOTE — Anesthesia Postprocedure Evaluation (Signed)
 Anesthesia Post Note  Patient: Taylor Dillon  Procedure(s) Performed: CARDIOVERSION     Patient location during evaluation: PACU Anesthesia Type: General Level of consciousness: awake and alert Pain management: pain level controlled Vital Signs Assessment: post-procedure vital signs reviewed and stable Respiratory status: spontaneous breathing, nonlabored ventilation, respiratory function stable and patient connected to nasal cannula oxygen Cardiovascular status: blood pressure returned to baseline and stable Postop Assessment: no apparent nausea or vomiting Anesthetic complications: no   No notable events documented.  Last Vitals:  Vitals:   08/27/23 0816 08/27/23 0826  BP: (!) 155/87 (!) 164/98  Pulse: 69 67  Resp: 15 15  Temp:    SpO2: 95% 95%    Last Pain:  Vitals:   08/27/23 0826  TempSrc:   PainSc: 0-No pain                 Lethaniel Rave

## 2023-08-27 NOTE — CV Procedure (Signed)
   DIRECT CURRENT CARDIOVERSION  NAME:  Taylor Dillon    MRN: [id number] DOB:  Jun 29, 1940    ADMIT DATE: 08/27/2023  Indication:  Symptomatic atrial fibrillation  Procedure Note:  The patient signed informed consent.  They have had had therapeutic anticoagulation with Eliquis  greater than 3 weeks.  Anesthesia was administered by Dr. Elby Green.  Adequate airway was maintained throughout and vital followed per protocol.  They were cardioverted x 1 with 200J of biphasic synchronized energy.  They converted to NSR.  There were no apparent complications.  The patient had normal neuro status and respiratory status post procedure with vitals stable as recorded elsewhere.    Follow up: They will continue on current medical therapy and follow up with cardiology as scheduled.  Awilda Bogus, [facility]  A Division of [facility] [address], [place], [place] [address]  [place], [place] [address] 8:22 AM

## 2023-08-28 ENCOUNTER — Encounter (HOSPITAL_COMMUNITY): Payer: Self-pay | Admitting: Cardiology

## 2023-09-01 ENCOUNTER — Other Ambulatory Visit: Payer: Self-pay

## 2023-09-01 ENCOUNTER — Encounter: Payer: Self-pay | Admitting: Cardiovascular Disease

## 2023-09-01 MED ORDER — AMIODARONE HCL 200 MG PO TABS: 200.0000 mg | ORAL_TABLET | Freq: Every day | ORAL | 3 refills | Status: DC

## 2023-09-02 DIAGNOSIS — N184 Chronic kidney disease, stage 4 (severe): Secondary | ICD-10-CM | POA: Diagnosis not present

## 2023-09-02 DIAGNOSIS — I4891 Unspecified atrial fibrillation: Secondary | ICD-10-CM | POA: Diagnosis not present

## 2023-09-02 DIAGNOSIS — Z905 Acquired absence of kidney: Secondary | ICD-10-CM | POA: Diagnosis not present

## 2023-09-02 DIAGNOSIS — I129 Hypertensive chronic kidney disease with stage 1 through stage 4 chronic kidney disease, or unspecified chronic kidney disease: Secondary | ICD-10-CM | POA: Diagnosis not present

## 2023-09-02 DIAGNOSIS — C642 Malignant neoplasm of left kidney, except renal pelvis: Secondary | ICD-10-CM | POA: Diagnosis not present

## 2023-09-02 MED ORDER — AMIODARONE HCL 200 MG PO TABS: 200.0000 mg | ORAL_TABLET | Freq: Two times a day (BID) | ORAL | 1 refills | Status: DC

## 2023-09-06 ENCOUNTER — Ambulatory Visit (HOSPITAL_COMMUNITY)
Admission: RE | Admit: 2023-09-06 | Discharge: 2023-09-06 | Disposition: A | Discharge status: Home / Self Care | Source: Ambulatory Visit | Attending: Internal Medicine | Admitting: Internal Medicine

## 2023-09-06 VITALS — BP 184/102 | HR 111 | Ht 69.0 in | Wt 154.2 lb

## 2023-09-06 DIAGNOSIS — Z79899 Other long term (current) drug therapy: Secondary | ICD-10-CM

## 2023-09-06 DIAGNOSIS — I4891 Unspecified atrial fibrillation: Secondary | ICD-10-CM | POA: Diagnosis not present

## 2023-09-06 DIAGNOSIS — I4819 Other persistent atrial fibrillation: Secondary | ICD-10-CM | POA: Diagnosis not present

## 2023-09-06 DIAGNOSIS — Z7901 Long term (current) use of anticoagulants: Secondary | ICD-10-CM | POA: Insufficient documentation

## 2023-09-06 DIAGNOSIS — I700 Atherosclerosis of aorta: Secondary | ICD-10-CM | POA: Diagnosis not present

## 2023-09-06 DIAGNOSIS — I2510 Atherosclerotic heart disease of native coronary artery without angina pectoris: Secondary | ICD-10-CM | POA: Insufficient documentation

## 2023-09-06 DIAGNOSIS — E785 Hyperlipidemia, unspecified: Secondary | ICD-10-CM | POA: Insufficient documentation

## 2023-09-06 DIAGNOSIS — I129 Hypertensive chronic kidney disease with stage 1 through stage 4 chronic kidney disease, or unspecified chronic kidney disease: Secondary | ICD-10-CM | POA: Diagnosis not present

## 2023-09-06 DIAGNOSIS — D6869 Other thrombophilia: Secondary | ICD-10-CM | POA: Diagnosis not present

## 2023-09-06 DIAGNOSIS — I272 Pulmonary hypertension, unspecified: Secondary | ICD-10-CM | POA: Insufficient documentation

## 2023-09-06 DIAGNOSIS — Z5181 Encounter for therapeutic drug level monitoring: Secondary | ICD-10-CM | POA: Diagnosis not present

## 2023-09-06 DIAGNOSIS — N1831 Chronic kidney disease, stage 3a: Secondary | ICD-10-CM | POA: Insufficient documentation

## 2023-09-06 MED ORDER — AMIODARONE HCL 200 MG PO TABS
200.0000 mg | ORAL_TABLET | Freq: Every day | ORAL | 0 refills | Status: DC
Start: 2023-09-06 — End: 2023-10-27

## 2023-09-06 NOTE — Progress Notes (Signed)
 Primary Care Physician: Mordechai April, DO Primary Cardiologist: Dr. 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 male with a history of pulmonary hypertension, aortic and coronary atherosclerosis by CT imaging, CKD stage 3a, history of renal cell carcinoma s/p nephrectomy, sobriety since 1996, HTN, HLD, atrial fibrillation who presents for consultation in the [facility]. First diagnosed with Afib 03/2023 concomitantly with pneumonia. Initial DCCV successful but patient had ERAF; s/p repeat DCCV on amiodarone . Procedure successful but patient monitored for 3 hours post procedure due to sinus pauses and bradyarrhythmias. Lopressor  decreased to 50 mg BID. He is on amiodarone  200 mg daily. Patient is on Eliquis  2.5 mg BID for a CHADS2VASC score of 4.  On evaluation today, he is currently in Afib. He noted to feel better when he was in sinus rhythm. He does note that over the past couple of days he was short of breath again and suspected he was back in Afib.   On follow up 09/06/23, he is currently in Afib. S/p successful DCCV on 5/30. He noted via Kardiamobile device he was back in Afib several hours after cardioversion unfortunately. He is taking amiodarone  200 mg daily. No missed doses of Eliquis .   Today, he denies symptoms of palpitations, chest pain, orthopnea, PND, lower extremity edema, dizziness, presyncope, syncope, snoring, daytime somnolence, bleeding, or neurologic sequela. The patient is tolerating medications without difficulties and is otherwise without complaint today.    he has a BMI of Body mass index is 22.77 kg/m.Aaron Aas Filed Weights   09/06/23 1457  Weight: 69.9 kg     Current Outpatient Medications  Medication Sig Dispense Refill   apixaban  (ELIQUIS ) 2.5 MG TABS tablet Take 1 tablet (2.5 mg total) by mouth 2 (two) times daily. 180 tablet 1   furosemide  (LASIX ) 20 MG tablet Take 1 tablet  (20 mg total) by mouth as needed. For swelling or weight gain of 3lbs over night or 5lbs in a week. 90 tablet 3   Multiple Vitamins-Minerals (CENTRUM SILVER ADULT 50+ PO) Take 1 tablet by mouth daily.     simvastatin  (ZOCOR ) 20 MG tablet Take 20 mg by mouth at bedtime.     terazosin  (HYTRIN ) 5 MG capsule Take 5 mg by mouth 2 (two) times daily.     amiodarone  (PACERONE ) 200 MG tablet Take 1 tablet (200 mg total) by mouth daily. 30 tablet 0   No current facility-administered medications for this encounter.    Atrial Fibrillation Management history:  Previous antiarrhythmic drugs: amiodarone  Previous cardioversions: 07/14/23, 08/03/23, 5/30 Previous ablations: none Anticoagulation history: Eliquis  2.5   ROS- All systems are reviewed and negative except as per the HPI above.  Physical Exam: BP (!) 184/102   Pulse (!) 111   Ht 5\' 9"  (1.753 m)   Wt 69.9 kg   BMI 22.77 kg/m   GEN- The patient is well appearing, alert and oriented x 3 today.   Neck - no JVD or carotid bruit noted Lungs- Clear to ausculation bilaterally, normal work of breathing Heart- Irregular tachycardic rate and rhythm, no murmurs, rubs or gallops, PMI not laterally displaced Extremities- no clubbing, cyanosis, or edema Skin - no rash or ecchymosis noted   EKG today demonstrates  Vent. rate 111 BPM PR interval * ms QRS duration 92 ms QT/QTcB 306/416 ms P-R-T axes * 229 44 Atrial fibrillation with rapid ventricular response Right superior axis deviation Abnormal ECG  When compared with ECG of 27-Aug-2023 08:08, Atrial fibrillation has replaced Sinus rhythm   Echo 04/11/23 demonstrated   1. Left ventricular ejection fraction, by estimation, is 55 to 60%. The  left ventricle has normal function. The left ventricle has no regional  wall motion abnormalities. There is mild left ventricular hypertrophy.  Left ventricular diastolic parameters  are indeterminate.   2. Hypertrophy at RV apex.. Right ventricular  systolic function is  moderately reduced. The right ventricular size is mildly enlarged. There  is severely elevated pulmonary artery systolic pressure.   3. Left atrial size was severely dilated.   4. Right atrial size was moderately dilated.   5. The mitral valve is normal in structure. Mild mitral valve  regurgitation.   6. The aortic valve is tricuspid. Aortic valve regurgitation is not  visualized. Aortic valve sclerosis/calcification is present, without any  evidence of aortic stenosis.   7. The inferior vena cava is dilated in size with <50% respiratory  variability, suggesting right atrial pressure of 15 mmHg.   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   S/p successful DCCV on 08/27/23.  He is currently in Afib. We discussed repeat cardioversion versus AV nodal ablation since options at this time appear limited. Will have patient increase amiodarone  to 200 mg BID to help lower heart rate for the time being. Will discuss case with EP whether reasonable to retry another cardioversion (additional time for amiodarone  reload again) or if at this time likely AV nodal ablation / PPM is in his best interest. Will update patient with EP recommendations after discussion.  After previous discussion with EP, less likely to be considered an ablation candidate due to abnormal creatinine and moderately reduced RV systolic function. Continue with current medication management.   High risk medication monitoring (ICD10: J342684) Patient requires ongoing monitoring for anti-arrhythmic medication which has the potential to cause life threatening arrhythmias or AV block. Qtc  stable. Increase amiodarone  to 200 mg BID.   Secondary Hypercoagulable State (ICD10:  D68.69) The patient is at significant risk for stroke/thromboembolism based upon his CHA2DS2-VASc Score of 4.  Continue Apixaban  (Eliquis ).  No missed doses of Eliquis  2.5 mg BID. Dosage correct based on age and creatinine > 1.5.     Follow up will be determined after case review with EP.   Minnie Amber, PA-C  Afib Clinic [facility] [address] [place], [place] [address] [phone]

## 2023-09-06 NOTE — Addendum Note (Signed)
 Encounter addended by: Ernestina Headland, CMA on: 09/06/2023 4:21 PM  Actions taken: Order list changed

## 2023-09-20 ENCOUNTER — Encounter (HOSPITAL_BASED_OUTPATIENT_CLINIC_OR_DEPARTMENT_OTHER): Payer: Self-pay | Admitting: Cardiovascular Disease

## 2023-09-20 NOTE — Telephone Encounter (Signed)
Routing to correct triage pool

## 2023-09-21 DIAGNOSIS — H43813 Vitreous degeneration, bilateral: Secondary | ICD-10-CM | POA: Diagnosis not present

## 2023-10-05 ENCOUNTER — Ambulatory Visit (HOSPITAL_COMMUNITY)
Admission: RE | Admit: 2023-10-05 | Discharge: 2023-10-05 | Disposition: A | Discharge status: Home / Self Care | Source: Ambulatory Visit | Attending: Internal Medicine | Admitting: Internal Medicine

## 2023-10-05 ENCOUNTER — Ambulatory Visit (HOSPITAL_COMMUNITY): Payer: Self-pay | Admitting: Internal Medicine

## 2023-10-05 VITALS — BP 190/90 | HR 105 | Ht 69.0 in | Wt 156.6 lb

## 2023-10-05 DIAGNOSIS — Z79899 Other long term (current) drug therapy: Secondary | ICD-10-CM | POA: Insufficient documentation

## 2023-10-05 DIAGNOSIS — Z5181 Encounter for therapeutic drug level monitoring: Secondary | ICD-10-CM | POA: Diagnosis not present

## 2023-10-05 DIAGNOSIS — I4891 Unspecified atrial fibrillation: Secondary | ICD-10-CM | POA: Diagnosis not present

## 2023-10-05 DIAGNOSIS — I4819 Other persistent atrial fibrillation: Secondary | ICD-10-CM | POA: Diagnosis not present

## 2023-10-05 DIAGNOSIS — D6869 Other thrombophilia: Secondary | ICD-10-CM | POA: Diagnosis not present

## 2023-10-05 LAB — BASIC METABOLIC PANEL WITH GFR
BUN/Creatinine Ratio: 11 (ref 10–24)
BUN: 25 mg/dL (ref 8–27)
CO2: 21 mmol/L (ref 20–29)
Calcium: 9 mg/dL (ref 8.6–10.2)
Chloride: 103 mmol/L (ref 96–106)
Creatinine, Ser: 2.19 mg/dL — ABNORMAL HIGH (ref 0.76–1.27)
Glucose: 99 mg/dL (ref 70–99)
Potassium: 4.4 mmol/L (ref 3.5–5.2)
Sodium: 137 mmol/L (ref 134–144)
eGFR: 29 mL/min/1.73 — ABNORMAL LOW (ref >59)

## 2023-10-05 LAB — CBC
Hematocrit: 42.4 % (ref 37.5–51.0)
Hemoglobin: 13.9 g/dL (ref 13.0–17.7)
MCH: 30.9 pg (ref 26.6–33.0)
MCHC: 32.8 g/dL (ref 31.5–35.7)
MCV: 94 fL (ref 79–97)
Platelets: 143 x10E3/uL — ABNORMAL LOW (ref 150–450)
RBC: 4.5 x10E6/uL (ref 4.14–5.80)
RDW: 15.8 % — ABNORMAL HIGH (ref 11.6–15.4)
WBC: 6.8 x10E3/uL (ref 3.4–10.8)

## 2023-10-05 NOTE — Patient Instructions (Signed)
 Decrease Amiodarone  to once daily 10/06/23  Cardioversion scheduled for: Thursday 10/07/23   - Arrive at the [facility] ([address]  and check in with ADMITTING at 11:30am   - Do not eat or drink anything after midnight the night prior to your procedure.   - Take all your morning medication (except diabetic medications) with a sip of water prior to arrival.  - Do NOT miss any doses of your blood thinner - if you should miss a dose or take a dose more than 4 hours late -- please notify our office immediately.  - You will not be able to drive home after your procedure. Please ensure you have a responsible adult to drive you home. You will need someone with you for 24 hours post procedure.     - Expect to be in the procedural area approximately 2 hours.   - If you feel as if you go back into normal rhythm prior to scheduled cardioversion, please notify our office immediately.   If your procedure is canceled in the cardioversion suite you will be charged a cancellation fee.

## 2023-10-05 NOTE — Progress Notes (Signed)
 Primary Care Physician: [name], DO Primary Cardiologist: Dr. [name]: None     Referring Physician: Dr. [name] is a 83 y.o. male with a history of pulmonary hypertension, aortic and coronary atherosclerosis by CT imaging, CKD stage 3a, history of renal cell carcinoma s/p nephrectomy, sobriety since 1996, HTN, HLD, atrial fibrillation who presents for consultation in the [facility]. First diagnosed with Afib 03/2023 concomitantly with pneumonia. Initial DCCV successful but patient had ERAF; s/p repeat DCCV on amiodarone . Procedure successful but patient monitored for 3 hours post procedure due to sinus pauses and bradyarrhythmias. Lopressor  decreased to 50 mg BID. He is on amiodarone  200 mg daily. Patient is on Eliquis  2.5 mg BID for a CHADS2VASC score of 4.  On evaluation today, he is currently in Afib. He noted to feel better when he was in sinus rhythm. He does note that over the past couple of days he was short of breath again and suspected he was back in Afib.   On follow up 09/06/23, he is currently in Afib. S/p successful DCCV on 5/30. He noted via Kardiamobile device he was back in Afib several hours after cardioversion unfortunately. He is taking amiodarone  200 mg daily. No missed doses of Eliquis .   On follow up 10/05/23, he is currently in Afib. He began amiodarone  reload of 200 mg BID at last visit. No missed doses of Eliquis . Patient notes white coat hypertension here in office; home BP readings are consistently 120-130 systolic.   Today, he denies symptoms of palpitations, chest pain, orthopnea, PND, lower extremity edema, dizziness, presyncope, syncope, snoring, daytime somnolence, bleeding, or neurologic sequela. The patient is tolerating medications without difficulties and is otherwise without complaint today.    he has a BMI of Body mass index is 23.13 kg/m.[name] Filed Weights   10/05/23 1007  Weight: 71  kg      Current Outpatient Medications  Medication Sig Dispense Refill   amiodarone  (PACERONE ) 200 MG tablet Take 1 tablet (200 mg total) by mouth daily. (Patient taking differently: Take 200 mg by mouth 2 (two) times daily.) 30 tablet 0   apixaban  (ELIQUIS ) 2.5 MG TABS tablet Take 1 tablet (2.5 mg total) by mouth 2 (two) times daily. 180 tablet 1   furosemide  (LASIX ) 20 MG tablet Take 1 tablet (20 mg total) by mouth as needed. For swelling or weight gain of 3lbs over night or 5lbs in a week. 90 tablet 3   Multiple Vitamins-Minerals (CENTRUM SILVER ADULT 50+ PO) Take 1 tablet by mouth daily.     simvastatin  (ZOCOR ) 20 MG tablet Take 20 mg by mouth at bedtime.     terazosin  (HYTRIN ) 5 MG capsule Take 5 mg by mouth 2 (two) times daily.     No current facility-administered medications for this encounter.    Atrial Fibrillation Management history:  Previous antiarrhythmic drugs: amiodarone  Previous cardioversions: 07/14/23, 08/03/23, 5/30 Previous ablations: none Anticoagulation history: Eliquis  2.5   ROS- All systems are reviewed and negative except as per the HPI above.  Physical Exam: BP (!) 190/90   Pulse (!) 105   Ht 5' 9 (1.753 m)   Wt 71 kg   BMI 23.13 kg/m   GEN- The patient is well appearing, alert and oriented x 3 today.   Neck - no JVD or carotid bruit noted Lungs- Clear to ausculation bilaterally, normal work of breathing Heart- Irregular rate and rhythm, no murmurs, rubs or gallops, PMI  not laterally displaced Extremities- no clubbing, cyanosis, or edema Skin - no rash or ecchymosis noted   EKG today demonstrates  Vent. rate 105 BPM PR interval * ms QRS duration 96 ms QT/QTcB 324/428 ms P-R-T axes * 147 15 Atrial flutter with variable A-V block Right axis deviation Nonspecific ST abnormality Abnormal ECG When compared with ECG of 06-Sep-2023 15:04, Atrial flutter has replaced Atrial fibrillation   Echo 04/11/23 demonstrated   1. Left ventricular  ejection fraction, by estimation, is 55 to 60%. The  left ventricle has normal function. The left ventricle has no regional  wall motion abnormalities. There is mild left ventricular hypertrophy.  Left ventricular diastolic parameters  are indeterminate.   2. Hypertrophy at RV apex.. Right ventricular systolic function is  moderately reduced. The right ventricular size is mildly enlarged. There  is severely elevated pulmonary artery systolic pressure.   3. Left atrial size was severely dilated.   4. Right atrial size was moderately dilated.   5. The mitral valve is normal in structure. Mild mitral valve  regurgitation.   6. The aortic valve is tricuspid. Aortic valve regurgitation is not  visualized. Aortic valve sclerosis/calcification is present, without any  evidence of aortic stenosis.   7. The inferior vena cava is dilated in size with <50% respiratory  variability, suggesting right atrial pressure of 15 mmHg.   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   S/p successful DCCV on 08/27/23.  He is currently in Afib. He began amiodarone  200 mg BID on 6/9. We discussed the procedure cardioversion to try to convert to NSR. We discussed the risks vs benefits of this procedure and how ultimately we cannot predict whether a patient will have early return of arrhythmia post procedure. After discussion, the patient wishes to proceed with cardioversion. Labs drawn today.   Informed Consent   Shared Decision Making/Informed Consent The risks (stroke, cardiac arrhythmias rarely resulting in the need for a temporary or permanent pacemaker, skin irritation or burns and  complications associated with conscious sedation including aspiration, arrhythmia, respiratory failure and death), benefits (restoration of normal sinus rhythm) and alternatives of a direct current cardioversion were explained in detail to Mr. Hazan and he agrees to proceed.       After previous discussion with EP, less likely to be considered an ablation candidate due to abnormal creatinine and moderately reduced RV systolic function. Continue with current medication management.   High risk medication monitoring (ICD10: U5195107) Patient requires ongoing monitoring for anti-arrhythmic medication which has the potential to cause life threatening arrhythmias or AV block. Qtc stable. Transition to amiodarone  200 mg once daily tomorrow.     Secondary Hypercoagulable State (ICD10:  D68.69) The patient is at significant risk for stroke/thromboembolism based upon his CHA2DS2-VASc Score of 4.  Continue Apixaban  (Eliquis ).  No missed doses of Eliquis  2.5 mg BID.     Follow up 2 weeks after DCCV.   Terra Pac, PA-C  Afib Clinic [facility] [address] [place], [place] [address] [phone]

## 2023-10-05 NOTE — H&P (View-Only) (Signed)
 Primary Care Physician: [name], DO Primary Cardiologist: Dr. [name]: None     Referring Physician: Dr. [name] is a 83 y.o. male with a history of pulmonary hypertension, aortic and coronary atherosclerosis by CT imaging, CKD stage 3a, history of renal cell carcinoma s/p nephrectomy, sobriety since 1996, HTN, HLD, atrial fibrillation who presents for consultation in the [facility]. First diagnosed with Afib 03/2023 concomitantly with pneumonia. Initial DCCV successful but patient had ERAF; s/p repeat DCCV on amiodarone . Procedure successful but patient monitored for 3 hours post procedure due to sinus pauses and bradyarrhythmias. Lopressor  decreased to 50 mg BID. He is on amiodarone  200 mg daily. Patient is on Eliquis  2.5 mg BID for a CHADS2VASC score of 4.  On evaluation today, he is currently in Afib. He noted to feel better when he was in sinus rhythm. He does note that over the past couple of days he was short of breath again and suspected he was back in Afib.   On follow up 09/06/23, he is currently in Afib. S/p successful DCCV on 5/30. He noted via Kardiamobile device he was back in Afib several hours after cardioversion unfortunately. He is taking amiodarone  200 mg daily. No missed doses of Eliquis .   On follow up 10/05/23, he is currently in Afib. He began amiodarone  reload of 200 mg BID at last visit. No missed doses of Eliquis . Patient notes white coat hypertension here in office; home BP readings are consistently 120-130 systolic.   Today, he denies symptoms of palpitations, chest pain, orthopnea, PND, lower extremity edema, dizziness, presyncope, syncope, snoring, daytime somnolence, bleeding, or neurologic sequela. The patient is tolerating medications without difficulties and is otherwise without complaint today.    he has a BMI of Body mass index is 23.13 kg/m.[name] Filed Weights   10/05/23 1007  Weight: 71  kg      Current Outpatient Medications  Medication Sig Dispense Refill   amiodarone  (PACERONE ) 200 MG tablet Take 1 tablet (200 mg total) by mouth daily. (Patient taking differently: Take 200 mg by mouth 2 (two) times daily.) 30 tablet 0   apixaban  (ELIQUIS ) 2.5 MG TABS tablet Take 1 tablet (2.5 mg total) by mouth 2 (two) times daily. 180 tablet 1   furosemide  (LASIX ) 20 MG tablet Take 1 tablet (20 mg total) by mouth as needed. For swelling or weight gain of 3lbs over night or 5lbs in a week. 90 tablet 3   Multiple Vitamins-Minerals (CENTRUM SILVER ADULT 50+ PO) Take 1 tablet by mouth daily.     simvastatin  (ZOCOR ) 20 MG tablet Take 20 mg by mouth at bedtime.     terazosin  (HYTRIN ) 5 MG capsule Take 5 mg by mouth 2 (two) times daily.     No current facility-administered medications for this encounter.    Atrial Fibrillation Management history:  Previous antiarrhythmic drugs: amiodarone  Previous cardioversions: 07/14/23, 08/03/23, 5/30 Previous ablations: none Anticoagulation history: Eliquis  2.5   ROS- All systems are reviewed and negative except as per the HPI above.  Physical Exam: BP (!) 190/90   Pulse (!) 105   Ht 5' 9 (1.753 m)   Wt 71 kg   BMI 23.13 kg/m   GEN- The patient is well appearing, alert and oriented x 3 today.   Neck - no JVD or carotid bruit noted Lungs- Clear to ausculation bilaterally, normal work of breathing Heart- Irregular rate and rhythm, no murmurs, rubs or gallops, PMI  not laterally displaced Extremities- no clubbing, cyanosis, or edema Skin - no rash or ecchymosis noted   EKG today demonstrates  Vent. rate 105 BPM PR interval * ms QRS duration 96 ms QT/QTcB 324/428 ms P-R-T axes * 147 15 Atrial flutter with variable A-V block Right axis deviation Nonspecific ST abnormality Abnormal ECG When compared with ECG of 06-Sep-2023 15:04, Atrial flutter has replaced Atrial fibrillation   Echo 04/11/23 demonstrated   1. Left ventricular  ejection fraction, by estimation, is 55 to 60%. The  left ventricle has normal function. The left ventricle has no regional  wall motion abnormalities. There is mild left ventricular hypertrophy.  Left ventricular diastolic parameters  are indeterminate.   2. Hypertrophy at RV apex.. Right ventricular systolic function is  moderately reduced. The right ventricular size is mildly enlarged. There  is severely elevated pulmonary artery systolic pressure.   3. Left atrial size was severely dilated.   4. Right atrial size was moderately dilated.   5. The mitral valve is normal in structure. Mild mitral valve  regurgitation.   6. The aortic valve is tricuspid. Aortic valve regurgitation is not  visualized. Aortic valve sclerosis/calcification is present, without any  evidence of aortic stenosis.   7. The inferior vena cava is dilated in size with <50% respiratory  variability, suggesting right atrial pressure of 15 mmHg.   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   S/p successful DCCV on 08/27/23.  He is currently in Afib. He began amiodarone  200 mg BID on 6/9. We discussed the procedure cardioversion to try to convert to NSR. We discussed the risks vs benefits of this procedure and how ultimately we cannot predict whether a patient will have early return of arrhythmia post procedure. After discussion, the patient wishes to proceed with cardioversion. Labs drawn today.   Informed Consent   Shared Decision Making/Informed Consent The risks (stroke, cardiac arrhythmias rarely resulting in the need for a temporary or permanent pacemaker, skin irritation or burns and  complications associated with conscious sedation including aspiration, arrhythmia, respiratory failure and death), benefits (restoration of normal sinus rhythm) and alternatives of a direct current cardioversion were explained in detail to Mr. Hazan and he agrees to proceed.       After previous discussion with EP, less likely to be considered an ablation candidate due to abnormal creatinine and moderately reduced RV systolic function. Continue with current medication management.   High risk medication monitoring (ICD10: U5195107) Patient requires ongoing monitoring for anti-arrhythmic medication which has the potential to cause life threatening arrhythmias or AV block. Qtc stable. Transition to amiodarone  200 mg once daily tomorrow.     Secondary Hypercoagulable State (ICD10:  D68.69) The patient is at significant risk for stroke/thromboembolism based upon his CHA2DS2-VASc Score of 4.  Continue Apixaban  (Eliquis ).  No missed doses of Eliquis  2.5 mg BID.     Follow up 2 weeks after DCCV.   Terra Pac, PA-C  Afib Clinic [facility] [address] [place], [place] [address] [phone]

## 2023-10-06 NOTE — Progress Notes (Signed)
 Spoke to patient and instructed them to come at 1130  and to be NPO after 0000.     Confirmed that patient will have a ride home and someone to stay with them for 24 hours after the procedure.  Confirmed blood thinner. Confirmed no breaks in taking blood thinner for 3+ weeks prior to procedure.

## 2023-10-07 ENCOUNTER — Ambulatory Visit (HOSPITAL_COMMUNITY)
Admission: RE | Admit: 2023-10-07 | Discharge: 2023-10-07 | Disposition: A | Discharge status: Home / Self Care | Attending: Cardiovascular Disease | Admitting: Cardiovascular Disease

## 2023-10-07 ENCOUNTER — Ambulatory Visit (HOSPITAL_COMMUNITY): Admitting: Anesthesiology

## 2023-10-07 ENCOUNTER — Encounter (HOSPITAL_COMMUNITY): Payer: Self-pay | Admitting: Cardiovascular Disease

## 2023-10-07 ENCOUNTER — Encounter (HOSPITAL_COMMUNITY)
Admission: RE | Disposition: A | Discharge status: Home / Self Care | Payer: Self-pay | Source: Home / Self Care | Attending: Cardiovascular Disease

## 2023-10-07 ENCOUNTER — Other Ambulatory Visit: Payer: Self-pay

## 2023-10-07 DIAGNOSIS — Z79899 Other long term (current) drug therapy: Secondary | ICD-10-CM | POA: Diagnosis not present

## 2023-10-07 DIAGNOSIS — E785 Hyperlipidemia, unspecified: Secondary | ICD-10-CM | POA: Insufficient documentation

## 2023-10-07 DIAGNOSIS — I272 Pulmonary hypertension, unspecified: Secondary | ICD-10-CM | POA: Insufficient documentation

## 2023-10-07 DIAGNOSIS — I4891 Unspecified atrial fibrillation: Secondary | ICD-10-CM

## 2023-10-07 DIAGNOSIS — I4819 Other persistent atrial fibrillation: Secondary | ICD-10-CM | POA: Insufficient documentation

## 2023-10-07 DIAGNOSIS — Z85528 Personal history of other malignant neoplasm of kidney: Secondary | ICD-10-CM | POA: Insufficient documentation

## 2023-10-07 DIAGNOSIS — N1831 Chronic kidney disease, stage 3a: Secondary | ICD-10-CM | POA: Diagnosis not present

## 2023-10-07 DIAGNOSIS — I129 Hypertensive chronic kidney disease with stage 1 through stage 4 chronic kidney disease, or unspecified chronic kidney disease: Secondary | ICD-10-CM | POA: Diagnosis not present

## 2023-10-07 DIAGNOSIS — D6869 Other thrombophilia: Secondary | ICD-10-CM | POA: Diagnosis not present

## 2023-10-07 DIAGNOSIS — I10 Essential (primary) hypertension: Secondary | ICD-10-CM

## 2023-10-07 DIAGNOSIS — Z905 Acquired absence of kidney: Secondary | ICD-10-CM | POA: Diagnosis not present

## 2023-10-07 DIAGNOSIS — Z7901 Long term (current) use of anticoagulants: Secondary | ICD-10-CM | POA: Insufficient documentation

## 2023-10-07 HISTORY — PX: CARDIOVERSION: EP1203

## 2023-10-07 SURGERY — CARDIOVERSION (CATH LAB)
Anesthesia: General

## 2023-10-07 MED ORDER — PROPOFOL 10 MG/ML IV BOLUS
INTRAVENOUS | Status: DC | PRN
Start: 2023-10-07 — End: 2023-10-07
  Administered 2023-10-07: 50 mg via INTRAVENOUS

## 2023-10-07 MED ORDER — LIDOCAINE HCL (PF) 2 % IJ SOLN
INTRAMUSCULAR | Status: DC | PRN
  Administered 2023-10-07: 60 mg via INTRADERMAL

## 2023-10-07 SURGICAL SUPPLY — 1 items: PAD DEFIB RADIO PHYSIO CONN (PAD) ×2 IMPLANT

## 2023-10-07 NOTE — Anesthesia Postprocedure Evaluation (Signed)
 Anesthesia Post Note  Patient: Taylor Dillon  Procedure(s) Performed: CARDIOVERSION     Patient location during evaluation: Cath Lab Anesthesia Type: General Level of consciousness: awake and alert Pain management: pain level controlled Vital Signs Assessment: post-procedure vital signs reviewed and stable Respiratory status: spontaneous breathing, nonlabored ventilation and respiratory function stable Cardiovascular status: blood pressure returned to baseline and stable Postop Assessment: no apparent nausea or vomiting Anesthetic complications: no   No notable events documented.  Last Vitals:  Vitals:   10/07/23 1225 10/07/23 1230  BP: (!) 145/91 (!) 156/81  Pulse: (!) 59 63  Resp: 15 (!) 21  Temp:    SpO2: 94% 92%    Last Pain:  Vitals:   10/07/23 1230  TempSrc:   PainSc: 0-No pain                 [name],W. EDMOND

## 2023-10-07 NOTE — Anesthesia Preprocedure Evaluation (Addendum)
 Anesthesia Evaluation  Patient identified by MRN, date of birth, ID band Patient awake    Reviewed: Allergy & Precautions, H&P , NPO status , Patient's Chart, lab work & pertinent test results  Airway Mallampati: II  TM Distance: >3 FB Neck ROM: Full    Dental no notable dental hx. (+) Teeth Intact, Dental Advisory Given   Pulmonary neg pulmonary ROS   Pulmonary exam normal breath sounds clear to auscultation       Cardiovascular hypertension, + dysrhythmias Atrial Fibrillation + Valvular Problems/Murmurs MR  Rhythm:Irregular Rate:Tachycardia     Neuro/Psych negative neurological ROS  negative psych ROS   GI/Hepatic negative GI ROS, Neg liver ROS,,,  Endo/Other  negative endocrine ROS    Renal/GU negative Renal ROS  negative genitourinary   Musculoskeletal   Abdominal   Peds  Hematology  (+) Blood dyscrasia, anemia   Anesthesia Other Findings   Reproductive/Obstetrics negative OB ROS                              Anesthesia Physical Anesthesia Plan  ASA: 3  Anesthesia Plan: General   Post-op Pain Management: Minimal or no pain anticipated   Induction: Intravenous  PONV Risk Score and Plan: 2 and Propofol  infusion  Airway Management Planned: Natural Airway and Nasal Cannula  Additional Equipment:   Intra-op Plan:   Post-operative Plan:   Informed Consent: I have reviewed the patients History and Physical, chart, labs and discussed the procedure including the risks, benefits and alternatives for the proposed anesthesia with the patient or authorized representative who has indicated his/her understanding and acceptance.     Dental advisory given  Plan Discussed with: CRNA  Anesthesia Plan Comments:          Anesthesia Quick Evaluation

## 2023-10-07 NOTE — CV Procedure (Signed)
 DCC: On Rx Eliquis  no missed doses Anesthesia:  Dr Epifanio Propofol   DCC x 1 250J biphasic Converted from afib rate 88 to NSR rate 61 bpm Initially had some LBBB junctional escapes  Patient has stopped his lopressor  since last DCC and  Had no long pauses this time  No immediate neurologic sequelae  Maude Emmer MD [facility]

## 2023-10-07 NOTE — Interval H&P Note (Signed)
 History and Physical Interval Note:  10/07/2023 11:49 AM  Taylor Dillon  has presented today for surgery, with the diagnosis of AFIB.  The various methods of treatment have been discussed with the patient and family. After consideration of risks, benefits and other options for treatment, the patient has consented to  Procedure(s): CARDIOVERSION (N/A) as a surgical intervention.  The patient's history has been reviewed, patient examined, no change in status, stable for surgery.  I have reviewed the patient's chart and labs.  Questions were answered to the patient's satisfaction.     Maude Emmer

## 2023-10-07 NOTE — Transfer of Care (Signed)
 Immediate Anesthesia Transfer of Care Note  Patient: Taylor Dillon  Procedure(s) Performed: CARDIOVERSION  Patient Location: Short Stay  Anesthesia Type:MAC  Level of Consciousness: drowsy and patient cooperative  Airway & Oxygen Therapy: Patient Spontanous Breathing and Patient connected to nasal cannula oxygen  Post-op Assessment: Report given to RN, Post -op Vital signs reviewed and stable, Patient moving all extremities, and Patient moving all extremities X 4  Post vital signs: Reviewed and stable  Last Vitals:  Vitals Value Taken Time  BP 144/76 10/07/23 1200  Temp    Pulse 61 10/07/18 1200  Resp 12 10/07/23 1200  SpO2 91 10/07/23 1200    Last Pain:  Vitals:   10/07/23 1117  TempSrc: Temporal         Complications: No notable events documented.

## 2023-10-08 ENCOUNTER — Encounter: Payer: Self-pay | Admitting: Cardiovascular Disease

## 2023-10-27 ENCOUNTER — Other Ambulatory Visit: Payer: Self-pay | Admitting: Cardiovascular Disease

## 2023-10-28 ENCOUNTER — Ambulatory Visit (HOSPITAL_COMMUNITY)
Admission: RE | Admit: 2023-10-28 | Discharge: 2023-10-28 | Disposition: A | Discharge status: Home / Self Care | Source: Ambulatory Visit | Attending: Internal Medicine | Admitting: Internal Medicine

## 2023-10-28 VITALS — BP 196/86 | HR 65 | Ht 69.0 in | Wt 149.2 lb

## 2023-10-28 DIAGNOSIS — I4819 Other persistent atrial fibrillation: Secondary | ICD-10-CM | POA: Diagnosis not present

## 2023-10-28 DIAGNOSIS — I4891 Unspecified atrial fibrillation: Secondary | ICD-10-CM | POA: Diagnosis not present

## 2023-10-28 DIAGNOSIS — D6869 Other thrombophilia: Secondary | ICD-10-CM | POA: Diagnosis not present

## 2023-10-28 DIAGNOSIS — Z79899 Other long term (current) drug therapy: Secondary | ICD-10-CM | POA: Diagnosis not present

## 2023-10-28 DIAGNOSIS — Z5181 Encounter for therapeutic drug level monitoring: Secondary | ICD-10-CM | POA: Diagnosis not present

## 2023-10-28 NOTE — Addendum Note (Signed)
 Encounter addended by: Terra Fairy PARAS, PA-C on: 10/28/2023 11:30 AM  Actions taken: Clinical Note Signed

## 2023-10-28 NOTE — Progress Notes (Addendum)
 Primary Care Physician: [name], DO Primary Cardiologist: Dr. [name]: None     Referring Physician: Dr. [name] is a 83 y.o. male with a history of pulmonary hypertension, aortic and coronary atherosclerosis by CT imaging, CKD stage 3a, history of renal cell carcinoma s/p nephrectomy, sobriety since 1996, HTN, HLD, atrial fibrillation who presents for consultation in the [facility]. First diagnosed with Afib 03/2023 concomitantly with pneumonia. Initial DCCV successful but patient had ERAF; s/p repeat DCCV on amiodarone . Procedure successful but patient monitored for 3 hours post procedure due to sinus pauses and bradyarrhythmias. Lopressor  decreased to 50 mg BID. He is on amiodarone  200 mg daily. Patient is on Eliquis  2.5 mg BID for a CHADS2VASC score of 4.  On evaluation today, he is currently in Afib. He noted to feel better when he was in sinus rhythm. He does note that over the past couple of days he was short of breath again and suspected he was back in Afib.   On follow up 09/06/23, he is currently in Afib. S/p successful DCCV on 5/30. He noted via [employer] device he was back in Afib several hours after cardioversion unfortunately. He is taking amiodarone  200 mg daily. No missed doses of Eliquis .   On follow up 10/05/23, he is currently in Afib. He began amiodarone  reload of 200 mg BID at last visit. No missed doses of Eliquis . Patient notes white coat hypertension here in office; home BP readings are consistently 120-130 systolic.   On follow up 10/28/23, patient is currently in NSR. S/p successful DCCV on 7/10. He feels much better in normal rhythm and has gotten back (slowly) to swimming. No missed doses of amiodarone  or Eliquis .  Today, he denies symptoms of palpitations, chest pain, orthopnea, PND, lower extremity edema, dizziness, presyncope, syncope, snoring, daytime somnolence, bleeding, or neurologic  sequela. The patient is tolerating medications without difficulties and is otherwise without complaint today.    he has a BMI of Body mass index is 22.03 kg/m.[name] Filed Weights   10/28/23 1104  Weight: 67.7 kg     Current Outpatient Medications  Medication Sig Dispense Refill   amiodarone  (PACERONE ) 200 MG tablet Take 1 tablet (200 mg total) by mouth daily. 90 tablet 2   apixaban  (ELIQUIS ) 2.5 MG TABS tablet Take 1 tablet (2.5 mg total) by mouth 2 (two) times daily. 180 tablet 1   furosemide  (LASIX ) 20 MG tablet Take 1 tablet (20 mg total) by mouth as needed. For swelling or weight gain of 3lbs over night or 5lbs in a week. 90 tablet 3   Multiple Vitamins-Minerals (CENTRUM SILVER ADULT 50+ PO) Take 1 tablet by mouth daily.     simvastatin  (ZOCOR ) 20 MG tablet Take 20 mg by mouth at bedtime.     terazosin  (HYTRIN ) 5 MG capsule Take 5 mg by mouth 2 (two) times daily.     No current facility-administered medications for this encounter.    Atrial Fibrillation Management history:  Previous antiarrhythmic drugs: amiodarone  Previous cardioversions: 07/14/23, 08/03/23, 5/30, 7/10 Previous ablations: none Anticoagulation history: Eliquis  2.5   ROS- All systems are reviewed and negative except as per the HPI above.  Physical Exam: BP (!) 196/86   Pulse 65   Ht 5' 9 (1.753 m)   Wt 67.7 kg   BMI 22.03 kg/m   GEN- The patient is well appearing, alert and oriented x 3 today.   Neck - no JVD or  carotid bruit noted Lungs- Clear to ausculation bilaterally, normal work of breathing Heart- Regular rate and rhythm, no murmurs, rubs or gallops, PMI not laterally displaced Extremities- no clubbing, cyanosis, or edema Skin - no rash or ecchymosis noted   EKG today demonstrates  Vent. rate 65 BPM PR interval 162 ms QRS duration 98 ms QT/QTcB 448/465 ms P-R-T axes 70 193 54 Normal sinus rhythm When compared with ECG of 07-Oct-2023 12:10, Previous ECG is present   Echo 04/11/23  demonstrated   1. Left ventricular ejection fraction, by estimation, is 55 to 60%. The  left ventricle has normal function. The left ventricle has no regional  wall motion abnormalities. There is mild left ventricular hypertrophy.  Left ventricular diastolic parameters  are indeterminate.   2. Hypertrophy at RV apex.. Right ventricular systolic function is  moderately reduced. The right ventricular size is mildly enlarged. There  is severely elevated pulmonary artery systolic pressure.   3. Left atrial size was severely dilated.   4. Right atrial size was moderately dilated.   5. The mitral valve is normal in structure. Mild mitral valve  regurgitation.   6. The aortic valve is tricuspid. Aortic valve regurgitation is not  visualized. Aortic valve sclerosis/calcification is present, without any  evidence of aortic stenosis.   7. The inferior vena cava is dilated in size with <50% respiratory  variability, suggesting right atrial pressure of 15 mmHg.   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   S/p successful DCCV on 08/27/23. S/p successful DCCV on 10/07/23.  He is currently in NSR. He is feeling much better in normal rhythm which is great news.    High risk medication monitoring (ICD10: U5195107) Patient requires ongoing monitoring for anti-arrhythmic medication which has the potential to cause life threatening arrhythmias or AV block. Qtc stable. Continue amiodarone  200 mg daily.   Secondary Hypercoagulable State (ICD10:  D68.69) The patient is at significant risk for stroke/thromboembolism based upon his CHA2DS2-VASc Score of 4.  Continue Apixaban  (Eliquis ).   Continue Eliquis  2.5 mg BID.   Hypertension It is noted on past office visits he has markedly elevated BP in office; he admits to anxiety about coming into appointment. He has home BP recordings over the past month which show normal readings ~120-130s/70s. Continue to monitor at home. He appears to have an element of white coat HTN.   Follow up 6 months for amiodarone  surveillance.   Terra Pac, PA-C  [facility] [address] [place], [place] [address] [phone]

## 2023-11-12 DIAGNOSIS — I10 Essential (primary) hypertension: Secondary | ICD-10-CM | POA: Diagnosis not present

## 2023-11-12 DIAGNOSIS — R251 Tremor, unspecified: Secondary | ICD-10-CM | POA: Diagnosis not present

## 2023-11-18 ENCOUNTER — Other Ambulatory Visit (HOSPITAL_COMMUNITY): Payer: Self-pay

## 2023-11-18 ENCOUNTER — Encounter: Payer: Self-pay | Admitting: Cardiovascular Disease

## 2023-11-18 MED ORDER — METOPROLOL TARTRATE 100 MG PO TABS
50.0000 mg | ORAL_TABLET | Freq: Two times a day (BID) | ORAL | Status: DC
Start: 2023-11-18 — End: 2023-11-30

## 2023-11-24 DIAGNOSIS — Z23 Encounter for immunization: Secondary | ICD-10-CM | POA: Diagnosis not present

## 2023-11-25 ENCOUNTER — Encounter: Payer: Self-pay | Admitting: Cardiovascular Disease

## 2023-11-30 ENCOUNTER — Other Ambulatory Visit (HOSPITAL_COMMUNITY): Payer: Self-pay | Admitting: *Deleted

## 2023-11-30 MED ORDER — METOPROLOL TARTRATE 25 MG PO TABS: 25.0000 mg | ORAL_TABLET | Freq: Two times a day (BID) | ORAL | 3 refills | Status: DC

## 2023-12-06 ENCOUNTER — Other Ambulatory Visit: Payer: Self-pay | Admitting: Cardiovascular Disease

## 2023-12-06 DIAGNOSIS — I480 Paroxysmal atrial fibrillation: Secondary | ICD-10-CM

## 2023-12-06 NOTE — Telephone Encounter (Signed)
 Prescription refill request for Eliquis  received. Indication: AFIB Last office visit:7/25 Scr:2.19  7/25 Age 83  Weight:67.7  kg  Prescription refilled

## 2023-12-08 ENCOUNTER — Other Ambulatory Visit (HOSPITAL_COMMUNITY): Payer: Self-pay | Admitting: *Deleted

## 2023-12-08 MED ORDER — METOPROLOL TARTRATE 25 MG PO TABS
12.5000 mg | ORAL_TABLET | Freq: Two times a day (BID) | ORAL | Status: DC
Start: 2023-12-08 — End: 2023-12-17

## 2023-12-08 MED ORDER — AMLODIPINE BESYLATE 2.5 MG PO TABS
2.5000 mg | ORAL_TABLET | Freq: Every day | ORAL | 2 refills | Status: DC
Start: 2023-12-08 — End: 2023-12-17

## 2023-12-10 ENCOUNTER — Encounter: Payer: Self-pay | Admitting: Neurology

## 2023-12-10 ENCOUNTER — Ambulatory Visit: Admitting: Neurology

## 2023-12-10 VITALS — BP 161/90 | HR 87 | Ht 69.0 in | Wt 156.0 lb

## 2023-12-10 DIAGNOSIS — G250 Essential tremor: Secondary | ICD-10-CM | POA: Diagnosis not present

## 2023-12-10 NOTE — Progress Notes (Signed)
 [facility]    Provider:  Dr Ines Referring Provider: Cyrena Gwenn SQUIBB, MD Primary Care Physician:  [name], DO  CC:  tremor  HPI:  Taylor Dillon is a 83 y.o. male here as a referral from Dr. Dayna for tremors. He was seen many years ago in the clinic for likely CTS in the hands. Today he is here for tremors. has Renal mass; Hyponatremia; SBO (small bowel obstruction) (HCC); CTS (carpal tunnel syndrome); CAP (community acquired pneumonia); Generalized weakness; Hypocalcemia; Paroxysmal atrial fibrillation (HCC); BPH (benign prostatic hyperplasia); Essential hypertension; HLD (hyperlipidemia); Anemia of chronic disease; Persistent atrial fibrillation (HCC); and Atrial fibrillation (HCC) on their problem list.  I reviewed Dr. Edris notes from November 12, 2023.  Patient concerned for Parkinson's disease.  He started noticing tremors over the last several weeks in the bilateral hands, more prevalent lately, has not seen it with rest but notices more as he does more activity, has been challenging to eat cereal as his hands shake a lot, he also notes that lately has felt like he is a bit worse balance than usual though notes that his overall gait has not seemed to have changed, no known family history of tremors, he is on amiodarone , not currently taking metoprolol .  On exam he appeared to have an action tremor with no resting tremor component.  I gait appears similar to previous.  He does not appear to have a shuffling gait.  Dr. Earlyne suspected essential tremor rather than Parkinson's disease.  Started end of July. In July he had a cardioversion for afib and he is free of afib, they stopped the metoprolol  and that's when he noticed the tremor. He was started back on the metoprolol  at a lower dose. His left is worse. He feels tremulous. He notices it more with actions like with a spoonful of sugar, steading his hand helps, he was restarted on metoprolol  and they may  increase. No resting tremor. Not falling. Not shuffling. He as a bad foot from running, no changes in gait. No fhx of tremor. No vivid dreams or acting out dreams. No dizziness upon standing. He has not noticed a head tremor or voice tremor. Tremor became worse acutely. Caffeine made it worse this morning. He has not noticed improvement yet on the lower dose metoprolol .   No other new focal neurologic deficits, associated symptoms, inciting events or modifiable factors.Patient complains of symptoms per HPI as well as the following symptoms: chronic foot deformities . Pertinent negatives and positives per HPI. All others negative     PRIOR APPOITMENT 2018 HPI 10/28/2026:  paresthesias/numbness. Past medical history hypertension and hyperlipidemia, hyperglycemia, paresthesias or numbness, malignant tumor kidney s/p resection.  he is a recovered alcoholic with alcoholic hepatitis with a history of elevated LFTs. The symptoms started mid may of this year. At that time his dosage for terazosin  was doubled. He has a little tingle and electrical shock mostly in the hands but sometimes in the feet. Very mild. Not painful, or affecting life. He is pre-diabetic. Not worsening. Started in the fingertips. Doesn;t happen in the toes but sometimes it happens in the arch and he has other foot problems and no tin the toes. He uses a mouse a long time. Symptoms more in the right hand. No neck pain or shooting pains into the arms.  He feels the tingling when he is driving. He feels it then. And when he wakes up he feels numbness in the hands. No symptoms in the  feet. No weakness. No other focal neurologic deficits, associated symptoms, inciting events or modifiable factors.  Reviewed notes, labs and imaging from outside physicians, which showed:   CBC with anemia hemoglobin 12.4, BMP with elevated glucose 128, elevated creatinine 1.51 labs from April 2018  B12 was drawn, TSH and hemoglobin A1c primary care physician.    Review of Systems: Patient complains of symptoms per HPI as well as the following symptoms: Easy bruising, ringing in ears. Pertinent negatives and positives per HPI. All others negative.   Social History   Socioeconomic History   Marital status: Divorced    Spouse name: Not on file   Number of children: 3   Years of education: Not on file   Highest education level: Not on file  Occupational History   Occupation: Retired AF  Tobacco Use   Smoking status: Never   Smokeless tobacco: Never   Tobacco comments:    Never smoked 08/05/23  Substance and Sexual Activity   Alcohol use: No    Comment: No Alcohol x 20 yrs recovering alcoholic   Drug use: No   Sexual activity: Not on file  Other Topics Concern   Not on file  Social History Narrative   Not on file   Social Drivers of Health   Financial Resource Strain: Not on file  Food Insecurity: No Food Insecurity (04/10/2023)   Hunger Vital Sign    Worried About Running Out of Food in the Last Year: Never true    Ran Out of Food in the Last Year: Never true  Transportation Needs: No Transportation Needs (04/10/2023)   PRAPARE - Administrator, Civil Service (Medical): No    Lack of Transportation (Non-Medical): No  Physical Activity: Not on file  Stress: Not on file  Social Connections: Unknown (04/10/2023)   Social Connection and Isolation Panel    Frequency of Communication with Friends and Family: Never    Frequency of Social Gatherings with Friends and Family: Never    Attends Religious Services: Never    Database administrator or Organizations: No    Attends Banker Meetings: Never    Marital Status: Patient declined  Catering manager Violence: Not At Risk (04/10/2023)   Humiliation, Afraid, Rape, and Kick questionnaire    Fear of Current or Ex-Partner: No    Emotionally Abused: No    Physically Abused: No    Sexually Abused: No    Family History  Problem Relation Age of Onset   Heart  attack Paternal Grandfather    Neuropathy Neg Hx     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 YRS AGO due to ETOH abuse - no Alcohol x 20 yrs    Past Surgical History:  Procedure Laterality Date   APPENDECTOMY  03/30/1972   CARDIOVERSION N/A 07/14/2023   Procedure: CARDIOVERSION;  Surgeon: Shlomo Wilbert SAUNDERS, MD;  Location: [facility];  Service: Cardiovascular;  Laterality: N/A;   CARDIOVERSION N/A 08/03/2023   Procedure: CARDIOVERSION;  Surgeon: Shlomo Wilbert SAUNDERS, MD;  Location: [facility];  Service: Cardiovascular;  Laterality: N/A;   CARDIOVERSION N/A 08/27/2023   Procedure: CARDIOVERSION;  Surgeon: Michele Richardson, DO;  Location: [facility];  Service: Cardiovascular;  Laterality: N/A;   CARDIOVERSION N/A 10/07/2023   Procedure: CARDIOVERSION;  Surgeon: Delford Coy  C, MD;  Location: [facility];  Service: Cardiovascular;  Laterality: N/A;   CATARACTS REMOVED  03/30/2012   HERNIA REPAIR  03/30/2000   INGUINAL HERNIA REPAIR Right 06/15/2014   Procedure: HERNIA REPAIR INGUINAL ADULT;  Surgeon: Vicenta Poli, MD;  Location: [facility];  Service: General;  Laterality: Right;   INSERTION OF MESH Right 06/15/2014   Procedure: INSERTION OF MESH;  Surgeon: Vicenta Poli, MD;  Location: [facility];  Service: General;  Laterality: Right;   ROBOT ASSISTED LAPAROSCOPIC NEPHRECTOMY Left 06/15/2014   Procedure: ROBOTIC ASSISTED LAPAROSCOPIC NEPHRECTOMY;  Surgeon: Ricardo Likens, MD;  Location: [facility];  Service: Urology;  Laterality: Left;   TONSILLECTOMY      Current Outpatient Medications  Medication Sig Dispense Refill   amiodarone  (PACERONE ) 200 MG tablet Take 1 tablet (200 mg total) by mouth daily. 90 tablet 2   amLODipine  (NORVASC ) 2.5 MG tablet Take 1 tablet (2.5 mg total) by mouth daily. 30 tablet 2   ELIQUIS  2.5 MG TABS tablet TAKE 1 TABLET TWICE A DAY  180 tablet 3   furosemide  (LASIX ) 20 MG tablet Take 1 tablet (20 mg total) by mouth as needed. For swelling or weight gain of 3lbs over night or 5lbs in a week. 90 tablet 3   metoprolol  tartrate (LOPRESSOR ) 25 MG tablet Take 0.5 tablets (12.5 mg total) by mouth 2 (two) times daily.     Multiple Vitamins-Minerals (CENTRUM SILVER ADULT 50+ PO) Take 1 tablet by mouth daily.     simvastatin  (ZOCOR ) 20 MG tablet Take 20 mg by mouth at bedtime.     terazosin  (HYTRIN ) 5 MG capsule Take 5 mg by mouth 2 (two) times daily.     No current facility-administered medications for this visit.    Allergies as of 12/10/2023   (No Known Allergies)    Vitals: BP (!) 161/90 Comment: pt had recent adjustment of BP meds  Pulse 87   Ht 5' 9 (1.753 m)   Wt 156 lb (70.8 kg)   BMI 23.04 kg/m  Last Weight:  Wt Readings from Last 1 Encounters:  12/10/23 156 lb (70.8 kg)   Last Height:   Ht Readings from Last 1 Encounters:  12/10/23 5' 9 (1.753 m)   Physical exam: Exam: Gen: NAD, conversant      CV: No palpitations or chest pain or SOB. VS: Breathing at a normal rate.  Not febrile. Eyes: Conjunctivae clear without exudates or hemorrhage  Neuro: Detailed Neurologic Exam  Speech:    Speech is normal; fluent and spontaneous with normal comprehension.  Cognition:    The patient is oriented to person, place, and time;     recent and remote memory intact;     language fluent;     normal attention, concentration, fund of knowledge Cranial Nerves:    The pupils are equal, round, and reactive to light. Visual fields are full Extraocular movements are intact.  The face is symmetric with normal sensation. The palate elevates in the midline. Hearing intact. Voice is normal. Shoulder shrug is normal. The tongue has normal motion without fasciculations.   Coordination: normal  Gait: Gait with eversion of feet, not shuffling, chronic  Motor Observation: Left > right high freq low amplitude postural and  action tremor Tone:    No cogwheeling  Posture:    Not stooped    Strength:    Strength is anti-gravity and symmetric in the upper and lower limbs.      Sensation: intact to LT  Assessment/Plan: This is an 83 year old patient who developed an action and postural tremor following discontinuation of metoprolol  after atrial fibrillation cardioversion. The tremor was likely unmasked or exacerbated by the withdrawal of beta-blocker therapy, as metoprolol  can provide symptomatic benefit in conditions such as essential tremor. The patient is currently titrating back up on metoprolol , which may help reduce tremor amplitude.  On examination, there are no parkinsonian features such as rest tremor, rigidity, bradykinesia, or gait disturbance, making Parkinson's disease unlikely. The clinical picture is more consistent with essential tremor, in agreement with Dr. Rosellen impression.  Plan:  Continue metoprolol  as tolerated, both for atrial fibrillation management and potential tremor suppression.  No additional neurologic intervention required at this time.  Recommend ongoing follow-up with primary care for continued management and monitoring of tremor symptoms.  Cc: Dr. Dayna Onetha Epp, MD  [facility] [address] [place], [place] [address]  Phone [phone] Fax [phone]

## 2023-12-10 NOTE — Patient Instructions (Signed)
 Essential Tremor A tremor is trembling or shaking that a person cannot control. Most tremors affect the hands or arms. Tremors can also affect the head, vocal cords, legs, and other parts of the body. Essential tremor is a tremor without a known cause. Usually, it occurs while a person is trying to perform an action. It tends to get worse gradually as a person ages. What are the causes? The cause of this condition is not known, but it often runs in families. What increases the risk? You are more likely to develop this condition if: You have a family member with essential tremor. You are 85 years of age or older. What are the signs or symptoms? The main sign of a tremor is a rhythmic shaking of certain parts of your body that is uncontrolled and unintentional. You may: Have difficulty eating with a spoon or fork. Have difficulty writing. Nod your head up and down or side to side. Have a quivering voice. The shaking may: Get worse over time. Come and go. Be more noticeable on one side of your body. Get worse due to stress, tiredness (fatigue), caffeine, and extreme heat or cold. How is this diagnosed? This condition may be diagnosed based on: Your symptoms and medical history. A physical exam. There is no single test to diagnose an essential tremor. However, your health care provider may order tests to rule out other causes of your condition. These may include: Blood and urine tests. Imaging studies of your brain, such as a CT scan or MRI. How is this treated? Treatment for essential tremor depends on the severity of the condition. Mild tremors may not need treatment if they do not affect your day-to-day life. Severe tremors may need to be treated using one or more of the following options: Medicines. Injections of a substance called botulinum toxin. Procedures such as deep brain stimulation (DBS) implantation or MRI-guided ultrasound treatment. Lifestyle changes. Occupational or  physical therapy. Follow these instructions at home: Lifestyle  Do not use any products that contain nicotine or tobacco. These products include cigarettes, chewing tobacco, and vaping devices, such as e-cigarettes. If you need help quitting, ask your health care provider. Limit your caffeine intake as told by your health care provider. Try to get 8 hours of sleep each night. Find ways to manage your stress that fit your lifestyle and personality. Consider trying meditation or yoga. Try to anticipate stressful situations and allow extra time to manage them. If you are struggling emotionally with the effects of your tremor, consider working with a mental health provider. General instructions Take over-the-counter and prescription medicines only as told by your health care provider. Avoid extreme heat and extreme cold. Keep all follow-up visits. This is important. Visits may include physical therapy visits. Where to find more information [employer] of Neurological Disorders and Stroke: [employer] Contact a health care provider if: You experience any changes in the location or intensity of your tremors. You start having a tremor after starting a new medicine. You have a tremor with other symptoms, such as: Numbness. Tingling. Pain. Weakness. Your tremor gets worse. Your tremor interferes with your daily life. You feel down, blue, or sad for at least 2 weeks in a row. Worrying about your tremor and what other people think about you interferes with your everyday life functions, including relationships, work, or school. Summary Essential tremor is a tremor without a known cause. Usually, it occurs when you are trying to perform an action. You are more likely  to develop this condition if you have a family member with essential tremor. The main sign of a tremor is a rhythmic shaking of certain parts of your body that is uncontrolled and unintentional. Treatment for essential  tremor depends on the severity of the condition. This information is not intended to replace advice given to you by your health care provider. Make sure you discuss any questions you have with your health care provider. Document Revised: 01/03/2021 Document Reviewed: 01/03/2021 Elsevier Patient Education  2024 [employer].

## 2023-12-17 ENCOUNTER — Other Ambulatory Visit (HOSPITAL_COMMUNITY): Payer: Self-pay | Admitting: *Deleted

## 2023-12-17 MED ORDER — AMLODIPINE BESYLATE 5 MG PO TABS: 5.0000 mg | ORAL_TABLET | Freq: Every day | ORAL | 2 refills | Status: DC

## 2023-12-20 ENCOUNTER — Other Ambulatory Visit (HOSPITAL_COMMUNITY): Payer: Self-pay | Admitting: *Deleted

## 2023-12-20 MED ORDER — AMLODIPINE BESYLATE 10 MG PO TABS: 10.0000 mg | ORAL_TABLET | Freq: Every day | ORAL | 0 refills | Status: DC

## 2023-12-31 DIAGNOSIS — Z23 Encounter for immunization: Secondary | ICD-10-CM | POA: Diagnosis not present

## 2024-01-12 ENCOUNTER — Other Ambulatory Visit (HOSPITAL_COMMUNITY): Payer: Self-pay | Admitting: *Deleted

## 2024-01-12 DIAGNOSIS — I480 Paroxysmal atrial fibrillation: Secondary | ICD-10-CM

## 2024-01-12 MED ORDER — LISINOPRIL 2.5 MG PO TABS: 2.5000 mg | ORAL_TABLET | Freq: Every day | ORAL | 1 refills | Status: DC

## 2024-01-13 DIAGNOSIS — I129 Hypertensive chronic kidney disease with stage 1 through stage 4 chronic kidney disease, or unspecified chronic kidney disease: Secondary | ICD-10-CM | POA: Diagnosis not present

## 2024-01-13 DIAGNOSIS — N184 Chronic kidney disease, stage 4 (severe): Secondary | ICD-10-CM | POA: Diagnosis not present

## 2024-01-13 DIAGNOSIS — I4891 Unspecified atrial fibrillation: Secondary | ICD-10-CM | POA: Diagnosis not present

## 2024-01-13 DIAGNOSIS — C642 Malignant neoplasm of left kidney, except renal pelvis: Secondary | ICD-10-CM | POA: Diagnosis not present

## 2024-01-13 DIAGNOSIS — N2581 Secondary hyperparathyroidism of renal origin: Secondary | ICD-10-CM | POA: Diagnosis not present

## 2024-01-13 DIAGNOSIS — Z905 Acquired absence of kidney: Secondary | ICD-10-CM | POA: Diagnosis not present

## 2024-01-19 DIAGNOSIS — I480 Paroxysmal atrial fibrillation: Secondary | ICD-10-CM | POA: Diagnosis not present

## 2024-01-20 ENCOUNTER — Ambulatory Visit (HOSPITAL_COMMUNITY): Payer: Self-pay | Admitting: Internal Medicine

## 2024-01-20 LAB — BASIC METABOLIC PANEL WITH GFR
BUN/Creatinine Ratio: 16 (ref 10–24)
BUN: 48 mg/dL — ABNORMAL HIGH (ref 8–27)
CO2: 16 mmol/L — ABNORMAL LOW (ref 20–29)
Calcium: 8.6 mg/dL (ref 8.6–10.2)
Chloride: 100 mmol/L (ref 96–106)
Creatinine, Ser: 2.93 mg/dL — ABNORMAL HIGH (ref 0.76–1.27)
Glucose: 101 mg/dL — ABNORMAL HIGH (ref 70–99)
Potassium: 4.8 mmol/L (ref 3.5–5.2)
Sodium: 132 mmol/L — ABNORMAL LOW (ref 134–144)
eGFR: 20 mL/min/1.73 — ABNORMAL LOW (ref >59)

## 2024-01-21 ENCOUNTER — Other Ambulatory Visit (HOSPITAL_COMMUNITY): Payer: Self-pay | Admitting: *Deleted

## 2024-01-21 DIAGNOSIS — I10 Essential (primary) hypertension: Secondary | ICD-10-CM

## 2024-01-21 NOTE — Progress Notes (Signed)
 Notified pt about Bmet with worsening creatinine compared to previous. J.Suarez P.A. recommend stopping lisinopril . He thinks he would benefit from seeing BP clinic for help with his BP management. Pt verbalized understanding and agrees to plan. Referral to HTN Clinic placed.

## 2024-02-04 DIAGNOSIS — I5033 Acute on chronic diastolic (congestive) heart failure: Secondary | ICD-10-CM | POA: Diagnosis not present

## 2024-02-04 DIAGNOSIS — G5712 Meralgia paresthetica, left lower limb: Secondary | ICD-10-CM | POA: Diagnosis not present

## 2024-02-11 DIAGNOSIS — I5033 Acute on chronic diastolic (congestive) heart failure: Secondary | ICD-10-CM | POA: Diagnosis not present

## 2024-02-15 DIAGNOSIS — E785 Hyperlipidemia, unspecified: Secondary | ICD-10-CM | POA: Diagnosis not present

## 2024-02-15 DIAGNOSIS — I5032 Chronic diastolic (congestive) heart failure: Secondary | ICD-10-CM | POA: Diagnosis not present

## 2024-02-15 DIAGNOSIS — R7303 Prediabetes: Secondary | ICD-10-CM | POA: Diagnosis not present

## 2024-02-15 DIAGNOSIS — I480 Paroxysmal atrial fibrillation: Secondary | ICD-10-CM | POA: Diagnosis not present

## 2024-02-15 DIAGNOSIS — N1832 Chronic kidney disease, stage 3b: Secondary | ICD-10-CM | POA: Diagnosis not present

## 2024-02-15 DIAGNOSIS — I10 Essential (primary) hypertension: Secondary | ICD-10-CM | POA: Diagnosis not present

## 2024-02-15 DIAGNOSIS — Z1331 Encounter for screening for depression: Secondary | ICD-10-CM | POA: Diagnosis not present

## 2024-02-15 DIAGNOSIS — N179 Acute kidney failure, unspecified: Secondary | ICD-10-CM | POA: Diagnosis not present

## 2024-02-15 DIAGNOSIS — Z905 Acquired absence of kidney: Secondary | ICD-10-CM | POA: Diagnosis not present

## 2024-02-15 DIAGNOSIS — Z0000 Encounter for general adult medical examination without abnormal findings: Secondary | ICD-10-CM | POA: Diagnosis not present

## 2024-02-28 DIAGNOSIS — R7303 Prediabetes: Secondary | ICD-10-CM | POA: Diagnosis not present

## 2024-03-09 ENCOUNTER — Other Ambulatory Visit (HOSPITAL_COMMUNITY): Payer: Self-pay | Admitting: Internal Medicine

## 2024-03-21 ENCOUNTER — Encounter: Payer: Self-pay | Admitting: Cardiovascular Disease

## 2024-04-13 ENCOUNTER — Encounter (HOSPITAL_BASED_OUTPATIENT_CLINIC_OR_DEPARTMENT_OTHER): Payer: Self-pay | Admitting: Family

## 2024-04-13 ENCOUNTER — Ambulatory Visit (INDEPENDENT_AMBULATORY_CARE_PROVIDER_SITE_OTHER): Admitting: Family

## 2024-04-13 VITALS — BP 132/48 | HR 54 | Ht 70.0 in | Wt 148.0 lb

## 2024-04-13 DIAGNOSIS — I10 Essential (primary) hypertension: Secondary | ICD-10-CM | POA: Diagnosis not present

## 2024-04-13 DIAGNOSIS — I4819 Other persistent atrial fibrillation: Secondary | ICD-10-CM | POA: Diagnosis not present

## 2024-04-13 DIAGNOSIS — D6859 Other primary thrombophilia: Secondary | ICD-10-CM | POA: Diagnosis not present

## 2024-04-13 NOTE — Progress Notes (Unsigned)
 "  Advanced Hypertension Clinic Initial Assessment:    Date:  04/14/2024   ID:  Taylor Dillon, DOB 01-Aug-1940, MRN [id number]  PCP:  Taylor Motto, DO  Cardiologist:  Taylor ONEIDA Decent, MD  Nephrologist: Dr. Gearline  Referring MD: Taylor Fairy PARAS, PA-C   CC: Hypertension  History of Present Illness:    Taylor Dillon is a 84 y.o. 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   Taylor Dillon was diagnosed with hypertension many years ago. Blood pressure checked with Omron arm cuff at home. Checks BP in the morning while sitting and watching news prior to medication. Readings have been reasonably controlled. he reports tobacco use never.  For exercise he was previously swimming at the [facility], he had a brief break and plans to return to doing this. He does try to limit salt but reports following a normal  diet slightly reduced appetite over the last couple of years with age, eats predominantly at home. He drinks one cup of coffee and otherwise iced tea. He drinks caffeinated tea, agreeable to switch to decaf. He thinks he is drinking less than 64 oz of fluid per day.  Swelling in bilateral LE, R>L. Worse by end of day. He does have a good diuretic response to the Lasix  which he is taking 20mg  daily.   Previous antihypertensives: Metoprolol  Liniopril   Past Medical History:  Diagnosis Date   BPH (benign prostatic hypertrophy)    Cancer [facility]) Jan 2016   Chronic kidney disease Jan 2016   only has one kidney   Hyperlipidemia    Hypertension    Renal mass, left 2016   removed left kidney   Seizures (HCC)    20 YRS AGO due to ETOH abuse - no Alcohol x 20 yrs    Past Surgical History:  Procedure Laterality Date   APPENDECTOMY  03/30/1972   CARDIOVERSION N/A 07/14/2023   Procedure: CARDIOVERSION;  Surgeon: Taylor Wilbert SAUNDERS, MD;  Location: [facility]  INVASIVE CV LAB;  Service: Cardiovascular;  Laterality: N/A;   CARDIOVERSION N/A 08/03/2023   Procedure: CARDIOVERSION;  Surgeon: Taylor Wilbert SAUNDERS, MD;  Location: [facility] INVASIVE CV LAB;  Service: Cardiovascular;  Laterality: N/A;   CARDIOVERSION N/A 08/27/2023   Procedure: CARDIOVERSION;  Surgeon: Taylor Richardson, DO;  Location: [facility] INVASIVE CV LAB;  Service: Cardiovascular;  Laterality: N/A;   CARDIOVERSION N/A 10/07/2023   Procedure: CARDIOVERSION;  Surgeon: Taylor Maude JAYSON, MD;  Location: [facility] INVASIVE CV LAB;  Service: Cardiovascular;  Laterality: N/A;   CATARACTS REMOVED  03/30/2012   HERNIA REPAIR  03/30/2000   INGUINAL HERNIA REPAIR Right 06/15/2014   Procedure: HERNIA REPAIR INGUINAL ADULT;  Surgeon: Taylor Poli, MD;  Location: [facility] ORS;  Service: General;  Laterality: Right;   INSERTION OF MESH Right 06/15/2014   Procedure: INSERTION OF MESH;  Surgeon: Taylor Poli, MD;  Location: [facility] ORS;  Service: General;  Laterality: Right;   ROBOT ASSISTED LAPAROSCOPIC NEPHRECTOMY Left 06/15/2014   Procedure: ROBOTIC ASSISTED LAPAROSCOPIC NEPHRECTOMY;  Surgeon: Taylor Likens, MD;  Location: [facility] ORS;  Service: Urology;  Laterality: Left;   TONSILLECTOMY      Current Medications: Active Medications[1]   Allergies:   Patient has no known allergies.   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  Tobacco Use   Smoking status: Never    Passive exposure: Past   Smokeless tobacco: Never   Tobacco comments:    Never smoked 08/05/23  Substance and Sexual Activity   Alcohol use: Not Currently    Comment: No alcohol since Feb 1996   Drug use: Never   Sexual activity: Not Currently    Birth control/protection: None  Other Topics Concern   Not on file  Social History Narrative   Not on file   Social  Drivers of Health   Tobacco Use: Low Risk (04/13/2024)   Patient History    Smoking Tobacco Use: Never    Smokeless Tobacco Use: Never    Passive Exposure: Past  Financial Resource Strain: Low Risk (04/13/2024)   Overall Financial Resource Strain (CARDIA)    Difficulty of Paying Living Expenses: Not hard at all  Food Insecurity: No Food Insecurity (04/13/2024)   Epic    Worried About Programme Researcher, Broadcasting/film/video in the Last Year: Never true    Ran Out of Food in the Last Year: Never true  Transportation Needs: No Transportation Needs (04/13/2024)   Epic    Lack of Transportation (Medical): No    Lack of Transportation (Non-Medical): No  Physical Activity: Inactive (04/13/2024)   Exercise Vital Sign    Days of Exercise per Week: 0 days    Minutes of Exercise per Session: 0 min  Stress: No Stress Concern Present (04/13/2024)   [employer] of Occupational Health - Occupational Stress Questionnaire    Feeling of Stress: Not at all  Social Connections: Socially Isolated (04/13/2024)   Social Connection and Isolation Panel    Frequency of Communication with Friends and Family: More than three times a week    Frequency of Social Gatherings with Friends and Family: Once a week    Attends Religious Services: Never    Database Administrator or Organizations: No    Attends Banker Meetings: Never    Marital Status: Divorced  Depression (PHQ2-9): Not on file  Alcohol Screen: Low Risk (04/13/2024)   Alcohol Screen    Last Alcohol Screening Score (AUDIT): 0  Housing: Low Risk (04/13/2024)   Epic    Unable to Pay for Housing in the Last Year: No    Number of Times Moved in the Last Year: 0    Homeless in the Last Year: No  Utilities: Not At Risk (04/13/2024)   Epic    Threatened with loss of utilities: No  Health Literacy: Adequate Health Literacy (04/13/2024)   B1300 Health Literacy    Frequency of need for help with medical instructions: Never     Family History: The patient's  family history includes Heart attack in his paternal grandfather. There is no history of Neuropathy.  ROS:   Please see the history of present illness.     All other systems reviewed and are negative.  EKGs/Labs/Other Studies Reviewed:         Recent Labs: 04/16/2023: Magnesium 2.0 10/05/2023: Hemoglobin 13.9; Platelets 143 01/19/2024: BUN 48; Creatinine, Ser 2.93; Potassium 4.8; Sodium 132   Recent Lipid Panel No results found for: CHOL, TRIG, HDL, CHOLHDL, VLDL, LDLCALC, LDLDIRECT  Physical Exam:   VS:  BP (!) 132/48 (BP Location: Left Arm, Patient Position: Sitting, Cuff Size: Normal)   Pulse (!) 54   Ht  5' 10 (1.778 m)   Wt 148 lb (67.1 kg)   SpO2 94%   BMI 21.24 kg/m  , BMI Body mass index is 21.24 kg/m.  Vitals:   04/13/24 1127 04/13/24 1132  BP: (!) 130/50 (!) 132/48  Pulse: (!) 54   Height: 5' 10 (1.778 m)   Weight: 148 lb (67.1 kg)   SpO2: 94%   BMI (Calculated): 21.24     GENERAL:  Well appearing HEENT: Pupils equal round and reactive, fundi not visualized, oral mucosa unremarkable NECK:  No jugular venous distention, waveform within normal limits, carotid upstroke brisk and symmetric, no bruits, no thyromegaly LYMPHATICS:  No cervical adenopathy LUNGS:  Clear to auscultation bilaterally HEART:  RRR.  PMI not displaced or sustained,S1 and S2 within normal limits, no S3, no S4, no clicks, no rubs, no murmurs ABD:  Flat, positive bowel sounds normal in frequency in pitch, no bruits, no rebound, no guarding, no midline pulsatile mass, no hepatomegaly, no splenomegaly EXT:  2 plus pulses throughout, no edema, no cyanosis no clubbing SKIN:  No rashes no nodules NEURO:  Cranial nerves II through XII grossly intact, motor grossly intact throughout PSYCH:  Cognitively intact, oriented to person place and time   ASSESSMENT/PLAN:    HTN - BP reasonably controlled in clinic.  Continue Lasix  20 mg daily, terazosin  5 mg twice daily, amlodipine  10 mg  daily. Discussed to monitor BP at home at least 2 hours after medications and sitting for 5-10 minutes.  Bring BP cuff to next clinic visit to assess accuracy Will update BMP, unable to collect today due to lab closure. Will request he have this done at upcoming AFib visit. If renal function stable, continue lasix  20mg  daily. If renal function not at baseline, plan to adjust Lasix  to 20mg  PRN.  Leg elevation, low sodium diet recommended. Handouts provided. Anticipate sodium and caffeine intake contributory to elevated BP, he is agreeable to reduce  Persistent atrial fib / hypercoagulable state - RRR by auscultation. Continue Amiodarone  200mg  daily, eliquis  2.5mg  BId. Reduced dose due to age, renal fxn  Screening for Secondary Hypertension:     Relevant Labs/Studies:    Latest Ref Rng & Units 01/19/2024    1:55 PM 10/05/2023   10:29 AM 08/27/2023    7:53 AM  Basic Labs  Sodium 134 - 144 mmol/L 132  137  138   Potassium 3.5 - 5.2 mmol/L 4.8  4.4  4.0   Creatinine 0.76 - 1.27 mg/dL 7.06  7.80  7.59        Latest Ref Rng & Units 04/12/2023    2:47 AM 04/10/2023   11:39 PM  Thyroid    TSH 0.350 - 4.500 uIU/mL 0.605  0.531              Latest Ref Rng & Units 04/12/2023    2:47 AM  Cortisol  Cortisol  ug/dL 83.5          Disposition:    FU with MD/APP/PharmD in 3 months    Medication Adjustments/Labs and Tests Ordered: Current medicines are reviewed at length with the patient today.  Concerns regarding medicines are outlined above.  No orders of the defined types were placed in this encounter.  No orders of the defined types were placed in this encounter.    Signed, Reche GORMAN Finder, NP  04/14/2024 1:21 PM    [facility]     [1]  Current Meds  Medication Sig   amiodarone  (PACERONE )  200 MG tablet Take 1 tablet (200 mg total) by mouth daily.   amLODipine  (NORVASC ) 10 MG tablet TAKE 1 TABLET DAILY (INCREASED DOSE)   ELIQUIS  2.5 MG TABS tablet TAKE 1  TABLET TWICE A DAY   furosemide  (LASIX ) 20 MG tablet Take 1 tablet (20 mg total) by mouth as needed. For swelling or weight gain of 3lbs over night or 5lbs in a week. (Patient taking differently: Take 20 mg by mouth daily. For swelling or weight gain of 3lbs over night or 5lbs in a week.)   Multiple Vitamins-Minerals (CENTRUM SILVER ADULT 50+ PO) Take 1 tablet by mouth daily.   simvastatin  (ZOCOR ) 20 MG tablet Take 20 mg by mouth at bedtime.   terazosin  (HYTRIN ) 5 MG capsule Take 5 mg by mouth 2 (two) times daily.   "

## 2024-04-13 NOTE — Patient Instructions (Signed)
 Medication Instructions:   Your physician recommends that you continue on your current medications as directed. Please refer to the Current Medication list given to you today.      Follow-Up: Please follow up in _3_ months in ADV HTN CLINIC with Dr. Raford, Reche Finder, NP or Allean Mink PharmD     Special Instructions:   Please see handouts provided today about low sodium diet tips and tricks  Please bring your blood pressure cuff to your next follow-up appointment with either afib clinic or here in Hypertension clinic

## 2024-04-14 ENCOUNTER — Encounter (HOSPITAL_BASED_OUTPATIENT_CLINIC_OR_DEPARTMENT_OTHER): Payer: Self-pay | Admitting: Family

## 2024-04-17 ENCOUNTER — Other Ambulatory Visit (HOSPITAL_BASED_OUTPATIENT_CLINIC_OR_DEPARTMENT_OTHER): Payer: Self-pay | Admitting: Family

## 2024-04-17 DIAGNOSIS — D6859 Other primary thrombophilia: Secondary | ICD-10-CM

## 2024-04-17 DIAGNOSIS — I480 Paroxysmal atrial fibrillation: Secondary | ICD-10-CM

## 2024-04-17 NOTE — Progress Notes (Signed)
 Orders placed for BMP, mag, CBC to be collected at upcoming AFib Clinic visit.   Yuriana Gaal S Perel Hauschild, NP

## 2024-04-27 ENCOUNTER — Encounter (HOSPITAL_COMMUNITY): Payer: Self-pay | Admitting: Internal Medicine

## 2024-04-27 ENCOUNTER — Ambulatory Visit (HOSPITAL_COMMUNITY)
Admission: RE | Admit: 2024-04-27 | Discharge: 2024-04-27 | Disposition: A | Discharge status: Home / Self Care | Source: Ambulatory Visit | Attending: Internal Medicine | Admitting: Internal Medicine

## 2024-04-27 VITALS — BP 138/50 | HR 52 | Ht 70.0 in | Wt 152.6 lb

## 2024-04-27 DIAGNOSIS — Z79899 Other long term (current) drug therapy: Secondary | ICD-10-CM | POA: Insufficient documentation

## 2024-04-27 DIAGNOSIS — Z5181 Encounter for therapeutic drug level monitoring: Secondary | ICD-10-CM | POA: Diagnosis present

## 2024-04-27 DIAGNOSIS — I4819 Other persistent atrial fibrillation: Secondary | ICD-10-CM | POA: Diagnosis present

## 2024-04-27 DIAGNOSIS — I480 Paroxysmal atrial fibrillation: Secondary | ICD-10-CM | POA: Insufficient documentation

## 2024-04-27 DIAGNOSIS — D6869 Other thrombophilia: Secondary | ICD-10-CM | POA: Insufficient documentation

## 2024-04-27 NOTE — Progress Notes (Signed)
 "   Primary Care Physician: Taylor Motto, DO Primary Cardiologist: Dr. [name]: None     Referring Physician: Dr. [name] is a 84 y.o. male with a history of pulmonary hypertension, aortic and coronary atherosclerosis by CT imaging, CKD stage 3a, history of renal cell carcinoma s/p nephrectomy, sobriety since 1996, HTN, HLD, atrial fibrillation who presents for consultation in the [facility]. First diagnosed with Afib 03/2023 concomitantly with pneumonia. Initial DCCV successful but patient had ERAF; s/p repeat DCCV on amiodarone . Procedure successful but patient monitored for 3 hours post procedure due to sinus pauses and bradyarrhythmias. Lopressor  decreased to 50 mg BID. He is on amiodarone  200 mg daily. Patient is on Eliquis  2.5 mg BID for a CHADS2VASC score of 4.  On evaluation today, he is currently in Afib. He noted to feel better when he was in sinus rhythm. He does note that over the past couple of days he was short of breath again and suspected he was back in Afib.   On follow up 09/06/23, he is currently in Afib. S/p successful DCCV on 5/30. He noted via Kardiamobile device he was back in Afib several hours after cardioversion unfortunately. He is taking amiodarone  200 mg daily. No missed doses of Eliquis .   On follow up 10/05/23, he is currently in Afib. He began amiodarone  reload of 200 mg BID at last visit. No missed doses of Eliquis . Patient notes white coat hypertension here in office; home BP readings are consistently 120-130 systolic.   On follow up 10/28/23, patient is currently in NSR. S/p successful DCCV on 7/10. He feels much better in normal rhythm and has gotten back (slowly) to swimming. No missed doses of amiodarone  or Eliquis .  Follow-up 04/27/2024 for amiodarone  surveillance.  Patient is currently in NSR.  He has had overall no A-fib burden since last office visit.  He checks his rhythm at home with a Kardia  mobile device.  He has noted ongoing swelling mainly in his right lower extremity.  His Lasix  was increased temporarily by PCP to 20 mg twice daily for 1 week.  He has a history of CKD so the decision was ultimately to transition back to 20 mg daily.  He has been using compression hose, wearing them mainly at night.  He does note improvement with the temporary increase in Lasix  and use of compression hose.  He is taking amiodarone  200 mg daily.  No bleeding issues on Eliquis  2.5 mg twice daily.  Today, he denies symptoms of palpitations, chest pain, orthopnea, PND, lower extremity edema, dizziness, presyncope, syncope, snoring, daytime somnolence, bleeding, or neurologic sequela. The patient is tolerating medications without difficulties and is otherwise without complaint today.    he has a BMI of Body mass index is 21.9 kg/m.[name] Filed Weights   04/27/24 1333  Weight: 69.2 kg      Current Outpatient Medications  Medication Sig Dispense Refill   amiodarone  (PACERONE ) 200 MG tablet Take 1 tablet (200 mg total) by mouth daily. 90 tablet 2   amLODipine  (NORVASC ) 10 MG tablet TAKE 1 TABLET DAILY (INCREASED DOSE) 90 tablet 1   ELIQUIS  2.5 MG TABS tablet TAKE 1 TABLET TWICE A DAY 180 tablet 3   furosemide  (LASIX ) 20 MG tablet Take 1 tablet (20 mg total) by mouth as needed. For swelling or weight gain of 3lbs over night or 5lbs in a week. (Patient taking differently: Take 20 mg by mouth daily. For swelling  or weight gain of 3lbs over night or 5lbs in a week.) 90 tablet 3   Multiple Vitamins-Minerals (CENTRUM SILVER ADULT 50+ PO) Take 1 tablet by mouth daily.     simvastatin  (ZOCOR ) 20 MG tablet Take 20 mg by mouth at bedtime.     terazosin  (HYTRIN ) 5 MG capsule Take 5 mg by mouth 2 (two) times daily.     No current facility-administered medications for this encounter.    Atrial Fibrillation Management history:  Previous antiarrhythmic drugs: amiodarone  Previous cardioversions: 07/14/23, 08/03/23,  5/30, 7/10 Previous ablations: none Anticoagulation history: Eliquis  2.5   ROS- All systems are reviewed and negative except as per the HPI above.  Physical Exam: BP (!) 138/50   Pulse (!) 52   Ht 5' 10 (1.778 m)   Wt 69.2 kg   BMI 21.90 kg/m   GEN- The patient is well appearing, alert and oriented x 3 today.   Neck - no JVD or carotid bruit noted Lungs- Clear to ausculation bilaterally, normal work of breathing Heart- Regular bradycardic rate and rhythm, no murmurs, rubs or gallops, PMI not laterally displaced Extremities-2+ edema right lower extremity Skin - no rash or ecchymosis noted    EKG today demonstrates  EKG Interpretation Date/Time:  Thursday April 27 2024 13:36:16 EST Ventricular Rate:  52 PR Interval:  156 QRS Duration:  100 QT Interval:  442 QTC Calculation: 411 R Axis:   24  Text Interpretation: Sinus bradycardia Nonspecific ST abnormality Abnormal ECG When compared with ECG of 28-Oct-2023 11:12, PREVIOUS ECG IS PRESENT Confirmed by Terra Pac ([id number]) on 04/27/2024 1:58:04 PM     Echo 04/11/23 demonstrated   1. Left ventricular ejection fraction, by estimation, is 55 to 60%. The  left ventricle has normal function. The left ventricle has no regional  wall motion abnormalities. There is mild left ventricular hypertrophy.  Left ventricular diastolic parameters  are indeterminate.   2. Hypertrophy at RV apex.. Right ventricular systolic function is  moderately reduced. The right ventricular size is mildly enlarged. There  is severely elevated pulmonary artery systolic pressure.   3. Left atrial size was severely dilated.   4. Right atrial size was moderately dilated.   5. The mitral valve is normal in structure. Mild mitral valve  regurgitation.   6. The aortic valve is tricuspid. Aortic valve regurgitation is not  visualized. Aortic valve sclerosis/calcification is present, without any  evidence of aortic stenosis.   7. The inferior vena cava is  dilated in size with <50% respiratory  variability, suggesting right atrial pressure of 15 mmHg.   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   S/p successful DCCV on 08/27/23. S/p successful DCCV on 10/07/23.  Patient is currently in NSR.  He is happy with overall management so we will not make any changes at this time.  We will draw blood work for amiodarone  surveillance and also include mag level and CBC per general cardiology request.  I recommended patient to continue Lasix  20 mg daily and to not increase until we have reassessed his creatinine.  Recommended to continue with compression hose and to also wear during the day.   High risk  medication monitoring (ICD10: J342684) Patient requires ongoing monitoring for anti-arrhythmic medication which has the potential to cause life threatening arrhythmias or AV block. Qtc stable. Continue amiodarone  200 mg daily. Cmet and TSH drawn today.   Secondary Hypercoagulable State (ICD10:  D68.69) The patient is at significant risk for stroke/thromboembolism based upon his CHA2DS2-VASc Score of 4.  Continue Apixaban  (Eliquis ).  Continue Eliquis  2.5 mg twice daily.  Dosage is correct based on age greater than 28 years old and creatinine greater than 1.5.  Hypertension Stable today.   Follow up 6 months for amiodarone  surveillance.   Terra Pac, PA-C  Afib Clinic [facility] [address] [place], [place] [address] [phone]   "

## 2024-04-28 ENCOUNTER — Ambulatory Visit (HOSPITAL_COMMUNITY): Payer: Self-pay | Admitting: Internal Medicine

## 2024-04-28 ENCOUNTER — Ambulatory Visit (HOSPITAL_BASED_OUTPATIENT_CLINIC_OR_DEPARTMENT_OTHER): Payer: Self-pay | Admitting: Family

## 2024-04-28 DIAGNOSIS — R7989 Other specified abnormal findings of blood chemistry: Secondary | ICD-10-CM

## 2024-04-28 DIAGNOSIS — I5032 Chronic diastolic (congestive) heart failure: Secondary | ICD-10-CM

## 2024-04-28 DIAGNOSIS — Z79899 Other long term (current) drug therapy: Secondary | ICD-10-CM

## 2024-04-28 LAB — BASIC METABOLIC PANEL WITH GFR
BUN/Creatinine Ratio: 16 (ref 10–24)
BUN: 47 mg/dL — ABNORMAL HIGH (ref 8–27)
CO2: 21 mmol/L (ref 20–29)
Calcium: 9.1 mg/dL (ref 8.6–10.2)
Chloride: 97 mmol/L (ref 96–106)
Creatinine, Ser: 3.01 mg/dL — ABNORMAL HIGH (ref 0.76–1.27)
Glucose: 127 mg/dL — ABNORMAL HIGH (ref 70–99)
Potassium: 4.5 mmol/L (ref 3.5–5.2)
Sodium: 133 mmol/L — ABNORMAL LOW (ref 134–144)
eGFR: 20 mL/min/{1.73_m2} — ABNORMAL LOW

## 2024-04-28 LAB — CBC
Hematocrit: 38.8 % (ref 37.5–51.0)
Hemoglobin: 12.7 g/dL — ABNORMAL LOW (ref 13.0–17.7)
MCH: 32.5 pg (ref 26.6–33.0)
MCHC: 32.7 g/dL (ref 31.5–35.7)
MCV: 99 fL — ABNORMAL HIGH (ref 79–97)
Platelets: 165 10*3/uL (ref 150–450)
RBC: 3.91 x10E6/uL — ABNORMAL LOW (ref 4.14–5.80)
RDW: 12.9 % (ref 11.6–15.4)
WBC: 5.2 10*3/uL (ref 3.4–10.8)

## 2024-04-28 LAB — HEPATIC FUNCTION PANEL
ALT: 31 [IU]/L (ref 0–44)
AST: 25 [IU]/L (ref 0–40)
Albumin: 4.3 g/dL (ref 3.7–4.7)
Alkaline Phosphatase: 111 [IU]/L (ref 48–129)
Bilirubin Total: 0.4 mg/dL (ref 0.0–1.2)
Bilirubin, Direct: 0.17 mg/dL (ref 0.00–0.40)
Total Protein: 7.3 g/dL (ref 6.0–8.5)

## 2024-04-28 LAB — TSH: TSH: 3.65 u[IU]/mL (ref 0.450–4.500)

## 2024-04-28 LAB — MAGNESIUM: Magnesium: 2.6 mg/dL — ABNORMAL HIGH (ref 1.6–2.3)

## 2024-04-28 MED ORDER — FUROSEMIDE 20 MG PO TABS: 20.0000 mg | ORAL_TABLET | Freq: Every day | ORAL | 3 refills | Status: AC | PRN

## 2024-04-28 NOTE — Telephone Encounter (Signed)
 The patient has been notified of the result and verbalized understanding.  All questions (if any) were answered.  Pt aware to decrease his lasix  to 20 mg by mouth daily as needed  for lower extremity edema or weight gain of 2 pounds overnight or 5 pounds in 1 week.   Pt aware to come back to the lab for repeat BMET in 1-2 weeks.  Order placed and released.  Advised the pt that if he is needing lasix  more than twice per week, to please call our office about this.   Pt verbalized understanding and agrees with this plan.

## 2024-04-28 NOTE — Telephone Encounter (Signed)
-----   Message from Reche Finder, NP sent at 04/28/2024  8:04 AM EST ----- CBC with mild anemia.  Recommend increasing intake of iron rich foods such as green leafy vegetables, legumes.  Mildly elevated magnesium, not of concern.  Kidney function decreased from previous.  Please change Lasix  to only as needed for lower extremity edema or weight gain of 2 pounds overnight or 5 pounds in 1 week.  If needing as needed Lasix  more than twice per week please contact our  office to make us  aware.  Repeat BMP in 1 to 2 weeks for monitoring of kidney function.

## 2024-05-05 LAB — BASIC METABOLIC PANEL WITH GFR
BUN/Creatinine Ratio: 16 (ref 10–24)
BUN: 39 mg/dL — ABNORMAL HIGH (ref 8–27)
CO2: 16 mmol/L — ABNORMAL LOW (ref 20–29)
Calcium: 8.5 mg/dL — ABNORMAL LOW (ref 8.6–10.2)
Chloride: 96 mmol/L (ref 96–106)
Creatinine, Ser: 2.47 mg/dL — ABNORMAL HIGH (ref 0.76–1.27)
Glucose: 99 mg/dL (ref 70–99)
Potassium: 4.8 mmol/L (ref 3.5–5.2)
Sodium: 128 mmol/L — ABNORMAL LOW (ref 134–144)
eGFR: 25 mL/min/{1.73_m2} — ABNORMAL LOW

## 2024-05-09 ENCOUNTER — Ambulatory Visit: Admitting: Orthopaedic Surgery

## 2024-07-13 ENCOUNTER — Encounter (HOSPITAL_BASED_OUTPATIENT_CLINIC_OR_DEPARTMENT_OTHER): Admitting: Family

## 2024-10-25 ENCOUNTER — Ambulatory Visit (HOSPITAL_COMMUNITY): Admitting: Internal Medicine
